# Patient Record
Sex: Female | Born: 1955 | Race: Black or African American | Hispanic: No | Marital: Married | Smoking: Former smoker
Health system: Southern US, Community
[De-identification: ages and names within clinical notes are randomized; demographics above are authoritative.]

## PROBLEM LIST (undated history)

## (undated) DIAGNOSIS — R5383 Other fatigue: Secondary | ICD-10-CM

## (undated) DIAGNOSIS — D509 Iron deficiency anemia, unspecified: Secondary | ICD-10-CM

## (undated) DIAGNOSIS — F419 Anxiety disorder, unspecified: Secondary | ICD-10-CM

## (undated) DIAGNOSIS — F32A Depression, unspecified: Secondary | ICD-10-CM

## (undated) DIAGNOSIS — M1990 Unspecified osteoarthritis, unspecified site: Secondary | ICD-10-CM

## (undated) DIAGNOSIS — F329 Major depressive disorder, single episode, unspecified: Secondary | ICD-10-CM

## (undated) DIAGNOSIS — E119 Type 2 diabetes mellitus without complications: Secondary | ICD-10-CM

## (undated) DIAGNOSIS — I10 Essential (primary) hypertension: Secondary | ICD-10-CM

## (undated) DIAGNOSIS — E7800 Pure hypercholesterolemia, unspecified: Secondary | ICD-10-CM

## (undated) DIAGNOSIS — K219 Gastro-esophageal reflux disease without esophagitis: Secondary | ICD-10-CM

## (undated) HISTORY — DX: Type 2 diabetes mellitus without complications: E11.9

## (undated) HISTORY — DX: Gastro-esophageal reflux disease without esophagitis: K21.9

## (undated) HISTORY — DX: Essential (primary) hypertension: I10

## (undated) HISTORY — DX: Other fatigue: R53.83

## (undated) HISTORY — DX: Pure hypercholesterolemia, unspecified: E78.00

## (undated) HISTORY — DX: Iron deficiency anemia, unspecified: D50.9

---

## 2004-01-12 ENCOUNTER — Emergency Department (HOSPITAL_COMMUNITY): Admission: EM | Admit: 2004-01-12 | Discharge: 2004-01-12 | Payer: Self-pay | Admitting: *Deleted

## 2004-04-04 ENCOUNTER — Ambulatory Visit (HOSPITAL_COMMUNITY): Admission: RE | Admit: 2004-04-04 | Discharge: 2004-04-04 | Payer: Self-pay | Admitting: Family Medicine

## 2004-04-11 ENCOUNTER — Encounter: Admission: RE | Admit: 2004-04-11 | Discharge: 2004-04-11 | Payer: Self-pay | Admitting: Family Medicine

## 2004-04-11 ENCOUNTER — Encounter (INDEPENDENT_AMBULATORY_CARE_PROVIDER_SITE_OTHER): Payer: Self-pay | Admitting: *Deleted

## 2004-05-23 ENCOUNTER — Ambulatory Visit: Payer: Self-pay | Admitting: Family Medicine

## 2004-11-14 ENCOUNTER — Ambulatory Visit: Payer: Self-pay | Admitting: Family Medicine

## 2004-12-19 ENCOUNTER — Ambulatory Visit: Payer: Self-pay | Admitting: Family Medicine

## 2004-12-28 ENCOUNTER — Ambulatory Visit: Payer: Self-pay | Admitting: Family Medicine

## 2005-02-20 ENCOUNTER — Ambulatory Visit: Payer: Self-pay | Admitting: Family Medicine

## 2005-02-21 ENCOUNTER — Ambulatory Visit (HOSPITAL_COMMUNITY): Admission: RE | Admit: 2005-02-21 | Discharge: 2005-02-21 | Payer: Self-pay | Admitting: Family Medicine

## 2005-03-07 ENCOUNTER — Ambulatory Visit (HOSPITAL_COMMUNITY): Admission: RE | Admit: 2005-03-07 | Discharge: 2005-03-07 | Payer: Self-pay | Admitting: *Deleted

## 2005-03-20 ENCOUNTER — Ambulatory Visit (HOSPITAL_COMMUNITY): Admission: RE | Admit: 2005-03-20 | Discharge: 2005-03-20 | Payer: Self-pay | Admitting: *Deleted

## 2005-06-19 ENCOUNTER — Ambulatory Visit: Payer: Self-pay | Admitting: Family Medicine

## 2005-09-18 ENCOUNTER — Ambulatory Visit: Payer: Self-pay | Admitting: Family Medicine

## 2005-11-21 ENCOUNTER — Encounter: Admission: RE | Admit: 2005-11-21 | Discharge: 2005-11-21 | Payer: Self-pay | Admitting: Family Medicine

## 2005-12-31 ENCOUNTER — Ambulatory Visit: Payer: Self-pay | Admitting: Family Medicine

## 2006-01-14 ENCOUNTER — Ambulatory Visit: Payer: Self-pay | Admitting: Family Medicine

## 2006-02-19 ENCOUNTER — Ambulatory Visit: Payer: Self-pay | Admitting: Family Medicine

## 2006-04-16 ENCOUNTER — Ambulatory Visit: Payer: Self-pay | Admitting: Family Medicine

## 2006-08-08 ENCOUNTER — Ambulatory Visit: Payer: Self-pay | Admitting: Family Medicine

## 2006-11-26 ENCOUNTER — Encounter: Payer: Self-pay | Admitting: Family Medicine

## 2006-11-26 ENCOUNTER — Other Ambulatory Visit: Admission: RE | Admit: 2006-11-26 | Discharge: 2006-11-26 | Payer: Self-pay | Admitting: Family Medicine

## 2006-11-26 ENCOUNTER — Ambulatory Visit: Payer: Self-pay | Admitting: Family Medicine

## 2006-11-26 LAB — CONVERTED CEMR LAB: Microalb, Ur: 2.13 mg/dL — ABNORMAL HIGH (ref 0.00–1.89)

## 2006-11-27 ENCOUNTER — Encounter: Payer: Self-pay | Admitting: Family Medicine

## 2006-11-27 LAB — CONVERTED CEMR LAB
ALT: 21 units/L (ref 0–35)
AST: 22 units/L (ref 0–37)
Albumin: 3.9 g/dL (ref 3.5–5.2)
Alkaline Phosphatase: 73 units/L (ref 39–117)
BUN: 6 mg/dL (ref 6–23)
Basophils Absolute: 0 10*3/uL (ref 0.0–0.1)
Basophils Relative: 0 % (ref 0–1)
Bilirubin, Direct: 0.1 mg/dL (ref 0.0–0.3)
CO2: 28 meq/L (ref 19–32)
Calcium: 8.9 mg/dL (ref 8.4–10.5)
Chloride: 99 meq/L (ref 96–112)
Cholesterol: 174 mg/dL (ref 0–200)
Creatinine, Ser: 0.58 mg/dL (ref 0.40–1.20)
Eosinophils Absolute: 0.2 10*3/uL (ref 0.0–0.7)
Eosinophils Relative: 2 % (ref 0–5)
Glucose, Bld: 101 mg/dL — ABNORMAL HIGH (ref 70–99)
HCT: 40.2 % (ref 36.0–46.0)
HDL: 53 mg/dL (ref >39)
Hemoglobin: 13 g/dL (ref 12.0–15.0)
Hgb A1c MFr Bld: 6.3 % — ABNORMAL HIGH (ref 4.6–6.1)
Indirect Bilirubin: 0.2 mg/dL (ref 0.0–0.9)
LDL Cholesterol: 96 mg/dL (ref 0–99)
Lymphocytes Relative: 42 % (ref 12–46)
Lymphs Abs: 4.4 10*3/uL — ABNORMAL HIGH (ref 0.7–3.3)
MCHC: 32.3 g/dL (ref 30.0–36.0)
MCV: 88.2 fL (ref 78.0–100.0)
Monocytes Absolute: 0.5 10*3/uL (ref 0.2–0.7)
Monocytes Relative: 5 % (ref 3–11)
Neutro Abs: 5.3 10*3/uL (ref 1.7–7.7)
Neutrophils Relative %: 51 % (ref 43–77)
Platelets: 333 10*3/uL (ref 150–400)
Potassium: 3.2 meq/L — ABNORMAL LOW (ref 3.5–5.3)
RBC: 4.56 M/uL (ref 3.87–5.11)
RDW: 16.1 % — ABNORMAL HIGH (ref 11.5–14.0)
Sodium: 140 meq/L (ref 135–145)
TSH: 4.039 microintl units/mL (ref 0.350–5.50)
Total Bilirubin: 0.3 mg/dL (ref 0.3–1.2)
Total CHOL/HDL Ratio: 3.3
Total Protein: 6.7 g/dL (ref 6.0–8.3)
Triglycerides: 127 mg/dL (ref <150)
VLDL: 25 mg/dL (ref 0–40)
WBC: 10.4 10*3/uL (ref 4.0–10.5)

## 2006-12-22 ENCOUNTER — Ambulatory Visit (HOSPITAL_COMMUNITY): Admission: RE | Admit: 2006-12-22 | Discharge: 2006-12-22 | Payer: Self-pay | Admitting: Family Medicine

## 2007-03-19 ENCOUNTER — Ambulatory Visit: Payer: Self-pay | Admitting: Family Medicine

## 2007-07-06 ENCOUNTER — Ambulatory Visit: Payer: Self-pay | Admitting: Family Medicine

## 2007-07-06 LAB — CONVERTED CEMR LAB
ALT: 13 units/L (ref 0–35)
AST: 13 units/L (ref 0–37)
Albumin: 4.1 g/dL (ref 3.5–5.2)
Alkaline Phosphatase: 63 units/L (ref 39–117)
BUN: 5 mg/dL — ABNORMAL LOW (ref 6–23)
Bilirubin, Direct: 0.1 mg/dL (ref 0.0–0.3)
CO2: 26 meq/L (ref 19–32)
Calcium: 8.8 mg/dL (ref 8.4–10.5)
Chloride: 100 meq/L (ref 96–112)
Cholesterol: 159 mg/dL (ref 0–200)
Creatinine, Ser: 0.58 mg/dL (ref 0.40–1.20)
Glucose, Bld: 145 mg/dL — ABNORMAL HIGH (ref 70–99)
HDL: 44 mg/dL (ref >39)
Indirect Bilirubin: 0.3 mg/dL (ref 0.0–0.9)
LDL Cholesterol: 89 mg/dL (ref 0–99)
Potassium: 3.4 meq/L — ABNORMAL LOW (ref 3.5–5.3)
Sodium: 139 meq/L (ref 135–145)
Total Bilirubin: 0.4 mg/dL (ref 0.3–1.2)
Total CHOL/HDL Ratio: 3.6
Total Protein: 7.3 g/dL (ref 6.0–8.3)
Triglycerides: 128 mg/dL (ref <150)
VLDL: 26 mg/dL (ref 0–40)

## 2007-07-20 ENCOUNTER — Ambulatory Visit: Payer: Self-pay | Admitting: Family Medicine

## 2007-11-18 ENCOUNTER — Ambulatory Visit: Payer: Self-pay | Admitting: Family Medicine

## 2007-11-24 DIAGNOSIS — F17200 Nicotine dependence, unspecified, uncomplicated: Secondary | ICD-10-CM | POA: Insufficient documentation

## 2007-11-24 DIAGNOSIS — E785 Hyperlipidemia, unspecified: Secondary | ICD-10-CM

## 2007-11-24 DIAGNOSIS — I10 Essential (primary) hypertension: Secondary | ICD-10-CM | POA: Insufficient documentation

## 2007-12-25 ENCOUNTER — Emergency Department (HOSPITAL_COMMUNITY): Admission: EM | Admit: 2007-12-25 | Discharge: 2007-12-25 | Payer: Self-pay | Admitting: Emergency Medicine

## 2007-12-28 ENCOUNTER — Ambulatory Visit: Payer: Self-pay | Admitting: Family Medicine

## 2007-12-30 ENCOUNTER — Encounter: Payer: Self-pay | Admitting: Family Medicine

## 2007-12-30 LAB — CONVERTED CEMR LAB: Microalb, Ur: 5.34 mg/dL — ABNORMAL HIGH (ref 0.00–1.89)

## 2007-12-31 ENCOUNTER — Encounter: Payer: Self-pay | Admitting: Family Medicine

## 2007-12-31 LAB — CONVERTED CEMR LAB
ALT: 18 units/L (ref 0–35)
AST: 15 units/L (ref 0–37)
Albumin: 3.9 g/dL (ref 3.5–5.2)
Alkaline Phosphatase: 54 units/L (ref 39–117)
BUN: 5 mg/dL — ABNORMAL LOW (ref 6–23)
Basophils Absolute: 0 10*3/uL (ref 0.0–0.1)
Basophils Relative: 0 % (ref 0–1)
Bilirubin, Direct: 0.1 mg/dL (ref 0.0–0.3)
CO2: 22 meq/L (ref 19–32)
Calcium: 8.7 mg/dL (ref 8.4–10.5)
Chloride: 102 meq/L (ref 96–112)
Cholesterol: 135 mg/dL (ref 0–200)
Creatinine, Ser: 0.65 mg/dL (ref 0.40–1.20)
Eosinophils Absolute: 0.2 10*3/uL (ref 0.0–0.7)
Eosinophils Relative: 2 % (ref 0–5)
Glucose, Bld: 114 mg/dL — ABNORMAL HIGH (ref 70–99)
HCT: 30.4 % — ABNORMAL LOW (ref 36.0–46.0)
HDL: 40 mg/dL (ref >39)
Hemoglobin: 9.5 g/dL — ABNORMAL LOW (ref 12.0–15.0)
Indirect Bilirubin: 0.2 mg/dL (ref 0.0–0.9)
LDL Cholesterol: 71 mg/dL (ref 0–99)
Lymphocytes Relative: 36 % (ref 12–46)
Lymphs Abs: 4.2 10*3/uL — ABNORMAL HIGH (ref 0.7–4.0)
MCHC: 31.3 g/dL (ref 30.0–36.0)
MCV: 82.2 fL (ref 78.0–100.0)
Monocytes Absolute: 0.5 10*3/uL (ref 0.1–1.0)
Monocytes Relative: 5 % (ref 3–12)
Neutro Abs: 6.9 10*3/uL (ref 1.7–7.7)
Neutrophils Relative %: 58 % (ref 43–77)
Platelets: 461 10*3/uL — ABNORMAL HIGH (ref 150–400)
Potassium: 3.5 meq/L (ref 3.5–5.3)
RBC: 3.7 M/uL — ABNORMAL LOW (ref 3.87–5.11)
RDW: 15.5 % (ref 11.5–15.5)
Sodium: 140 meq/L (ref 135–145)
Total Bilirubin: 0.3 mg/dL (ref 0.3–1.2)
Total CHOL/HDL Ratio: 3.4
Total Protein: 6.9 g/dL (ref 6.0–8.3)
Triglycerides: 121 mg/dL (ref <150)
VLDL: 24 mg/dL (ref 0–40)
WBC: 11.9 10*3/uL — ABNORMAL HIGH (ref 4.0–10.5)

## 2008-02-22 ENCOUNTER — Telehealth: Payer: Self-pay | Admitting: Family Medicine

## 2008-02-22 ENCOUNTER — Ambulatory Visit: Payer: Self-pay | Admitting: Family Medicine

## 2008-02-22 ENCOUNTER — Encounter: Payer: Self-pay | Admitting: Family Medicine

## 2008-02-22 DIAGNOSIS — E119 Type 2 diabetes mellitus without complications: Secondary | ICD-10-CM

## 2008-02-22 LAB — CONVERTED CEMR LAB
Basophils Absolute: 0 10*3/uL (ref 0.0–0.1)
Basophils Relative: 0 % (ref 0–1)
Eosinophils Absolute: 0.1 10*3/uL (ref 0.0–0.7)
Eosinophils Relative: 2 % (ref 0–5)
Glucose, Bld: 133 mg/dL
HCT: 25.3 % — ABNORMAL LOW (ref 36.0–46.0)
Hemoglobin: 8 g/dL — ABNORMAL LOW (ref 12.0–15.0)
Lymphocytes Relative: 24 % (ref 12–46)
Lymphs Abs: 1.9 10*3/uL (ref 0.7–4.0)
MCHC: 31.8 g/dL (ref 30.0–36.0)
MCV: 73.5 fL — ABNORMAL LOW (ref 78.0–100.0)
Monocytes Absolute: 0.7 10*3/uL (ref 0.1–1.0)
Monocytes Relative: 8 % (ref 3–12)
Neutro Abs: 5.2 10*3/uL (ref 1.7–7.7)
Neutrophils Relative %: 66 % (ref 43–77)
Platelets: 359 10*3/uL (ref 150–400)
RBC: 3.44 M/uL — ABNORMAL LOW (ref 3.87–5.11)
RDW: 16.8 % — ABNORMAL HIGH (ref 11.5–15.5)
WBC: 7.9 10*3/uL (ref 4.0–10.5)

## 2008-02-23 LAB — CONVERTED CEMR LAB: Retic Ct Pct: 1.6 % (ref 0.4–3.1)

## 2008-03-02 ENCOUNTER — Ambulatory Visit (HOSPITAL_COMMUNITY): Admission: RE | Admit: 2008-03-02 | Discharge: 2008-03-02 | Payer: Self-pay | Admitting: Family Medicine

## 2008-03-15 ENCOUNTER — Ambulatory Visit (HOSPITAL_COMMUNITY): Admission: RE | Admit: 2008-03-15 | Discharge: 2008-03-15 | Payer: Self-pay | Admitting: General Surgery

## 2008-03-15 ENCOUNTER — Encounter: Payer: Self-pay | Admitting: Family Medicine

## 2008-03-30 ENCOUNTER — Ambulatory Visit: Payer: Self-pay | Admitting: Family Medicine

## 2008-03-30 ENCOUNTER — Encounter: Payer: Self-pay | Admitting: Family Medicine

## 2008-03-30 ENCOUNTER — Other Ambulatory Visit: Admission: RE | Admit: 2008-03-30 | Discharge: 2008-03-30 | Payer: Self-pay | Admitting: Family Medicine

## 2008-04-01 ENCOUNTER — Encounter: Payer: Self-pay | Admitting: Family Medicine

## 2008-04-04 ENCOUNTER — Ambulatory Visit: Payer: Self-pay | Admitting: Family Medicine

## 2008-04-04 ENCOUNTER — Ambulatory Visit (HOSPITAL_COMMUNITY): Admission: RE | Admit: 2008-04-04 | Discharge: 2008-04-04 | Payer: Self-pay | Admitting: Family Medicine

## 2008-04-04 ENCOUNTER — Telehealth: Payer: Self-pay | Admitting: Family Medicine

## 2008-04-21 ENCOUNTER — Ambulatory Visit: Payer: Self-pay | Admitting: Family Medicine

## 2008-04-21 ENCOUNTER — Telehealth: Payer: Self-pay | Admitting: Family Medicine

## 2008-04-25 ENCOUNTER — Encounter: Payer: Self-pay | Admitting: Family Medicine

## 2008-05-09 ENCOUNTER — Telehealth: Payer: Self-pay | Admitting: Family Medicine

## 2008-05-11 ENCOUNTER — Telehealth: Payer: Self-pay | Admitting: Family Medicine

## 2008-05-12 ENCOUNTER — Ambulatory Visit: Payer: Self-pay | Admitting: Family Medicine

## 2008-05-17 ENCOUNTER — Encounter: Payer: Self-pay | Admitting: Family Medicine

## 2008-06-22 ENCOUNTER — Telehealth: Payer: Self-pay | Admitting: Family Medicine

## 2008-06-22 ENCOUNTER — Ambulatory Visit: Payer: Self-pay | Admitting: Family Medicine

## 2008-07-28 ENCOUNTER — Encounter: Payer: Self-pay | Admitting: Family Medicine

## 2008-07-28 ENCOUNTER — Ambulatory Visit: Payer: Self-pay | Admitting: Family Medicine

## 2008-07-28 LAB — CONVERTED CEMR LAB: Glucose, Bld: 110 mg/dL

## 2008-08-01 ENCOUNTER — Encounter: Payer: Self-pay | Admitting: Family Medicine

## 2008-08-02 LAB — CONVERTED CEMR LAB
ALT: 25 units/L (ref 0–35)
AST: 16 units/L (ref 0–37)
Albumin: 3.9 g/dL (ref 3.5–5.2)
CO2: 25 meq/L (ref 19–32)
Calcium: 8.5 mg/dL (ref 8.4–10.5)
HDL: 43 mg/dL (ref >39)
Sodium: 144 meq/L (ref 135–145)
Total Bilirubin: 0.3 mg/dL (ref 0.3–1.2)
Total CHOL/HDL Ratio: 3.5

## 2008-08-04 ENCOUNTER — Telehealth: Payer: Self-pay | Admitting: Family Medicine

## 2008-08-05 ENCOUNTER — Telehealth: Payer: Self-pay | Admitting: Family Medicine

## 2008-08-29 ENCOUNTER — Encounter: Payer: Self-pay | Admitting: Family Medicine

## 2008-08-30 LAB — CONVERTED CEMR LAB: Potassium: 3.6 meq/L (ref 3.5–5.3)

## 2008-09-06 ENCOUNTER — Encounter: Payer: Self-pay | Admitting: Family Medicine

## 2008-09-08 ENCOUNTER — Encounter: Payer: Self-pay | Admitting: Family Medicine

## 2008-09-12 ENCOUNTER — Ambulatory Visit: Payer: Self-pay | Admitting: Family Medicine

## 2008-10-13 ENCOUNTER — Encounter: Payer: Self-pay | Admitting: Family Medicine

## 2008-10-26 ENCOUNTER — Telehealth: Payer: Self-pay | Admitting: Family Medicine

## 2008-11-14 ENCOUNTER — Ambulatory Visit: Payer: Self-pay | Admitting: Family Medicine

## 2008-11-14 LAB — CONVERTED CEMR LAB: Hgb A1c MFr Bld: 6.2 %

## 2009-01-16 ENCOUNTER — Ambulatory Visit: Payer: Self-pay | Admitting: Family Medicine

## 2009-02-16 ENCOUNTER — Encounter: Payer: Self-pay | Admitting: Family Medicine

## 2009-02-17 ENCOUNTER — Ambulatory Visit: Payer: Self-pay | Admitting: Cardiology

## 2009-02-17 ENCOUNTER — Ambulatory Visit: Payer: Self-pay | Admitting: Family Medicine

## 2009-02-17 DIAGNOSIS — R079 Chest pain, unspecified: Secondary | ICD-10-CM | POA: Insufficient documentation

## 2009-02-20 DIAGNOSIS — R5383 Other fatigue: Secondary | ICD-10-CM

## 2009-02-20 DIAGNOSIS — R5381 Other malaise: Secondary | ICD-10-CM | POA: Insufficient documentation

## 2009-02-22 ENCOUNTER — Ambulatory Visit: Payer: Self-pay | Admitting: Cardiovascular Disease

## 2009-02-22 ENCOUNTER — Encounter: Payer: Self-pay | Admitting: Cardiology

## 2009-02-22 ENCOUNTER — Ambulatory Visit (HOSPITAL_COMMUNITY): Admission: RE | Admit: 2009-02-22 | Discharge: 2009-02-22 | Payer: Self-pay | Admitting: Cardiology

## 2009-02-23 LAB — CONVERTED CEMR LAB
BUN: 5 mg/dL — ABNORMAL LOW (ref 6–23)
Bilirubin, Direct: 0.1 mg/dL (ref 0.0–0.3)
CO2: 27 meq/L (ref 19–32)
Chloride: 98 meq/L (ref 96–112)
Creatinine, Urine: 89.3 mg/dL
Glucose, Bld: 106 mg/dL — ABNORMAL HIGH (ref 70–99)
Hemoglobin: 12.9 g/dL (ref 12.0–15.0)
Hgb A1c MFr Bld: 5.8 % (ref 4.6–6.1)
Indirect Bilirubin: 0.4 mg/dL (ref 0.0–0.9)
LDL Cholesterol: 100 mg/dL — ABNORMAL HIGH (ref 0–99)
Microalb Creat Ratio: 10 mg/g (ref 0.0–30.0)
Potassium: 3.3 meq/L — ABNORMAL LOW (ref 3.5–5.3)
RBC: 4.64 M/uL (ref 3.87–5.11)
TSH: 4.222 microintl units/mL (ref 0.350–4.500)
VLDL: 27 mg/dL (ref 0–40)
WBC: 9.4 10*3/uL (ref 4.0–10.5)

## 2009-02-27 ENCOUNTER — Telehealth: Payer: Self-pay | Admitting: Cardiology

## 2009-04-06 ENCOUNTER — Ambulatory Visit: Payer: Self-pay | Admitting: Family Medicine

## 2009-04-06 ENCOUNTER — Encounter: Payer: Self-pay | Admitting: Family Medicine

## 2009-04-06 ENCOUNTER — Other Ambulatory Visit: Admission: RE | Admit: 2009-04-06 | Discharge: 2009-04-06 | Payer: Self-pay | Admitting: Family Medicine

## 2009-04-06 DIAGNOSIS — N3000 Acute cystitis without hematuria: Secondary | ICD-10-CM

## 2009-04-06 LAB — CONVERTED CEMR LAB
Blood in Urine, dipstick: NEGATIVE
Candida species: NEGATIVE
Glucose, Bld: 117 mg/dL
Glucose, Urine, Semiquant: NEGATIVE
Specific Gravity, Urine: 1.025
WBC Urine, dipstick: NEGATIVE
pH: 6

## 2009-04-07 ENCOUNTER — Encounter: Payer: Self-pay | Admitting: Family Medicine

## 2009-04-19 ENCOUNTER — Telehealth: Payer: Self-pay | Admitting: Family Medicine

## 2009-04-26 ENCOUNTER — Ambulatory Visit: Payer: Self-pay | Admitting: Family Medicine

## 2009-05-10 ENCOUNTER — Ambulatory Visit: Payer: Self-pay | Admitting: Family Medicine

## 2009-05-10 DIAGNOSIS — J209 Acute bronchitis, unspecified: Secondary | ICD-10-CM | POA: Insufficient documentation

## 2009-05-10 DIAGNOSIS — J0100 Acute maxillary sinusitis, unspecified: Secondary | ICD-10-CM

## 2009-05-10 DIAGNOSIS — H6690 Otitis media, unspecified, unspecified ear: Secondary | ICD-10-CM | POA: Insufficient documentation

## 2009-05-10 LAB — CONVERTED CEMR LAB: Glucose, Bld: 112 mg/dL

## 2009-05-18 ENCOUNTER — Encounter: Payer: Self-pay | Admitting: Family Medicine

## 2009-07-26 ENCOUNTER — Ambulatory Visit: Payer: Self-pay | Admitting: Family Medicine

## 2009-07-26 DIAGNOSIS — E119 Type 2 diabetes mellitus without complications: Secondary | ICD-10-CM | POA: Insufficient documentation

## 2009-07-26 DIAGNOSIS — F339 Major depressive disorder, recurrent, unspecified: Secondary | ICD-10-CM

## 2009-07-26 DIAGNOSIS — R7303 Prediabetes: Secondary | ICD-10-CM | POA: Insufficient documentation

## 2009-07-27 DIAGNOSIS — E876 Hypokalemia: Secondary | ICD-10-CM | POA: Insufficient documentation

## 2009-07-27 LAB — CONVERTED CEMR LAB
Albumin: 3.4 g/dL — ABNORMAL LOW (ref 3.5–5.2)
Alkaline Phosphatase: 77 units/L (ref 39–117)
CO2: 31 meq/L (ref 19–32)
Chloride: 95 meq/L — ABNORMAL LOW (ref 96–112)
Creatinine, Ser: 0.45 mg/dL (ref 0.40–1.20)
HDL: 48 mg/dL (ref >39)
Hgb A1c MFr Bld: 5.8 % (ref 4.6–6.1)
LDL Cholesterol: 91 mg/dL (ref 0–99)
Potassium: 2.8 meq/L — ABNORMAL LOW (ref 3.5–5.3)
Total Bilirubin: 0.4 mg/dL (ref 0.3–1.2)
Total CHOL/HDL Ratio: 3.4
Total Protein: 6 g/dL (ref 6.0–8.3)
Triglycerides: 128 mg/dL (ref <150)
VLDL: 26 mg/dL (ref 0–40)

## 2009-11-21 ENCOUNTER — Telehealth (INDEPENDENT_AMBULATORY_CARE_PROVIDER_SITE_OTHER): Payer: Self-pay | Admitting: *Deleted

## 2009-11-22 ENCOUNTER — Ambulatory Visit: Payer: Self-pay | Admitting: Family Medicine

## 2009-11-22 DIAGNOSIS — R634 Abnormal weight loss: Secondary | ICD-10-CM

## 2009-11-23 ENCOUNTER — Encounter: Payer: Self-pay | Admitting: Physician Assistant

## 2009-11-24 ENCOUNTER — Telehealth: Payer: Self-pay | Admitting: Family Medicine

## 2009-11-24 LAB — CONVERTED CEMR LAB
Chloride: 101 meq/L (ref 96–112)
Potassium: 3.2 meq/L — ABNORMAL LOW (ref 3.5–5.3)
Sodium: 141 meq/L (ref 135–145)

## 2009-12-12 ENCOUNTER — Encounter: Payer: Self-pay | Admitting: Internal Medicine

## 2009-12-12 ENCOUNTER — Ambulatory Visit: Payer: Self-pay | Admitting: Internal Medicine

## 2009-12-12 DIAGNOSIS — R109 Unspecified abdominal pain: Secondary | ICD-10-CM

## 2009-12-12 DIAGNOSIS — R1310 Dysphagia, unspecified: Secondary | ICD-10-CM | POA: Insufficient documentation

## 2009-12-13 ENCOUNTER — Ambulatory Visit: Payer: Self-pay | Admitting: Family Medicine

## 2009-12-13 DIAGNOSIS — B354 Tinea corporis: Secondary | ICD-10-CM | POA: Insufficient documentation

## 2009-12-14 ENCOUNTER — Ambulatory Visit: Payer: Self-pay | Admitting: Internal Medicine

## 2009-12-14 ENCOUNTER — Ambulatory Visit (HOSPITAL_COMMUNITY): Admission: RE | Admit: 2009-12-14 | Discharge: 2009-12-14 | Payer: Self-pay | Admitting: Internal Medicine

## 2009-12-14 HISTORY — PX: ESOPHAGOGASTRODUODENOSCOPY: SHX1529

## 2009-12-21 ENCOUNTER — Encounter: Payer: Self-pay | Admitting: Internal Medicine

## 2009-12-28 ENCOUNTER — Telehealth: Payer: Self-pay | Admitting: Family Medicine

## 2010-01-01 ENCOUNTER — Ambulatory Visit: Payer: Self-pay | Admitting: Internal Medicine

## 2010-01-01 ENCOUNTER — Ambulatory Visit (HOSPITAL_COMMUNITY): Admission: RE | Admit: 2010-01-01 | Discharge: 2010-01-01 | Payer: Self-pay | Admitting: Internal Medicine

## 2010-01-01 HISTORY — PX: COLONOSCOPY: SHX174

## 2010-01-06 ENCOUNTER — Encounter: Payer: Self-pay | Admitting: Internal Medicine

## 2010-01-08 ENCOUNTER — Telehealth: Payer: Self-pay | Admitting: Family Medicine

## 2010-01-08 ENCOUNTER — Encounter: Payer: Self-pay | Admitting: Internal Medicine

## 2010-01-15 LAB — CONVERTED CEMR LAB
BUN: 6 mg/dL (ref 6–23)
CO2: 26 meq/L (ref 19–32)
Calcium: 8.8 mg/dL (ref 8.4–10.5)
Chloride: 105 meq/L (ref 96–112)
Creatinine, Ser: 0.46 mg/dL (ref 0.40–1.20)
Glucose, Bld: 109 mg/dL — ABNORMAL HIGH (ref 70–99)
Hgb A1c MFr Bld: 5.9 % — ABNORMAL HIGH (ref <5.7)
Potassium: 3.9 meq/L (ref 3.5–5.3)
Sodium: 142 meq/L (ref 135–145)

## 2010-01-18 ENCOUNTER — Ambulatory Visit: Payer: Self-pay | Admitting: Family Medicine

## 2010-01-19 ENCOUNTER — Encounter: Payer: Self-pay | Admitting: Family Medicine

## 2010-01-23 LAB — CONVERTED CEMR LAB
AST: 16 units/L (ref 0–37)
Albumin: 3.6 g/dL (ref 3.5–5.2)
Bilirubin, Direct: <0.1 mg/dL (ref 0.0–0.3)
HDL: 61 mg/dL (ref >39)
Total Bilirubin: 0.2 mg/dL — ABNORMAL LOW (ref 0.3–1.2)
Total CHOL/HDL Ratio: 3.3
VLDL: 22 mg/dL (ref 0–40)

## 2010-02-27 ENCOUNTER — Ambulatory Visit: Payer: Self-pay | Admitting: Gastroenterology

## 2010-02-27 DIAGNOSIS — D126 Benign neoplasm of colon, unspecified: Secondary | ICD-10-CM

## 2010-04-30 ENCOUNTER — Other Ambulatory Visit: Admission: RE | Admit: 2010-04-30 | Discharge: 2010-04-30 | Payer: Self-pay | Admitting: Family Medicine

## 2010-04-30 ENCOUNTER — Ambulatory Visit: Payer: Self-pay | Admitting: Family Medicine

## 2010-04-30 LAB — CONVERTED CEMR LAB: OCCULT 1: NEGATIVE

## 2010-05-01 ENCOUNTER — Encounter: Payer: Self-pay | Admitting: Family Medicine

## 2010-05-01 LAB — CONVERTED CEMR LAB
Creatinine, Urine: 230.8 mg/dL
Microalb Creat Ratio: 3.8 mg/g (ref 0.0–30.0)
Microalb, Ur: 0.88 mg/dL (ref 0.00–1.89)

## 2010-05-02 ENCOUNTER — Encounter: Payer: Self-pay | Admitting: Family Medicine

## 2010-05-02 ENCOUNTER — Telehealth: Payer: Self-pay | Admitting: Family Medicine

## 2010-05-03 ENCOUNTER — Encounter: Payer: Self-pay | Admitting: Family Medicine

## 2010-05-04 LAB — CONVERTED CEMR LAB
ALT: 13 units/L (ref 0–35)
AST: 17 units/L (ref 0–37)
Albumin: 3.9 g/dL (ref 3.5–5.2)
Alkaline Phosphatase: 78 units/L (ref 39–117)
BUN: 8 mg/dL (ref 6–23)
Basophils Absolute: 0 10*3/uL (ref 0.0–0.1)
Basophils Relative: 0 % (ref 0–1)
Bilirubin, Direct: <0.1 mg/dL (ref 0.0–0.3)
CO2: 26 meq/L (ref 19–32)
Calcium: 8.9 mg/dL (ref 8.4–10.5)
Chloride: 104 meq/L (ref 96–112)
Cholesterol: 212 mg/dL — ABNORMAL HIGH (ref 0–200)
Creatinine, Ser: 0.59 mg/dL (ref 0.40–1.20)
Eosinophils Absolute: 0.2 10*3/uL (ref 0.0–0.7)
Eosinophils Relative: 3 % (ref 0–5)
Glucose, Bld: 103 mg/dL — ABNORMAL HIGH (ref 70–99)
HCT: 32.1 % — ABNORMAL LOW (ref 36.0–46.0)
HDL: 49 mg/dL (ref >39)
Hemoglobin: 10.5 g/dL — ABNORMAL LOW (ref 12.0–15.0)
Hgb A1c MFr Bld: 6.1 % — ABNORMAL HIGH (ref <5.7)
LDL Cholesterol: 132 mg/dL — ABNORMAL HIGH (ref 0–99)
Lymphocytes Relative: 36 % (ref 12–46)
Lymphs Abs: 3.3 10*3/uL (ref 0.7–4.0)
MCHC: 32.7 g/dL (ref 30.0–36.0)
MCV: 81.3 fL (ref 78.0–100.0)
Monocytes Absolute: 0.5 10*3/uL (ref 0.1–1.0)
Monocytes Relative: 6 % (ref 3–12)
Neutro Abs: 4.9 10*3/uL (ref 1.7–7.7)
Neutrophils Relative %: 55 % (ref 43–77)
Platelets: 378 10*3/uL (ref 150–400)
Potassium: 3.8 meq/L (ref 3.5–5.3)
RBC: 3.95 M/uL (ref 3.87–5.11)
RDW: 15.2 % (ref 11.5–15.5)
Retic Ct Pct: 1.4 % (ref 0.4–3.1)
Sodium: 141 meq/L (ref 135–145)
Total Bilirubin: 0.3 mg/dL (ref 0.3–1.2)
Total CHOL/HDL Ratio: 4.3
Total Protein: 6.9 g/dL (ref 6.0–8.3)
Triglycerides: 153 mg/dL — ABNORMAL HIGH (ref <150)
Uric Acid, Serum: 4.4 mg/dL (ref 2.4–7.0)
VLDL: 31 mg/dL (ref 0–40)
WBC: 9 10*3/uL (ref 4.0–10.5)

## 2010-05-07 ENCOUNTER — Ambulatory Visit (HOSPITAL_COMMUNITY): Admission: RE | Admit: 2010-05-07 | Discharge: 2010-05-07 | Payer: Self-pay | Admitting: Family Medicine

## 2010-05-15 ENCOUNTER — Ambulatory Visit (HOSPITAL_COMMUNITY): Admission: RE | Admit: 2010-05-15 | Discharge: 2010-05-15 | Payer: Self-pay | Admitting: Family Medicine

## 2010-05-15 ENCOUNTER — Ambulatory Visit: Payer: Self-pay | Admitting: Family Medicine

## 2010-05-15 ENCOUNTER — Telehealth: Payer: Self-pay | Admitting: Family Medicine

## 2010-05-15 DIAGNOSIS — M25579 Pain in unspecified ankle and joints of unspecified foot: Secondary | ICD-10-CM

## 2010-05-16 ENCOUNTER — Encounter: Payer: Self-pay | Admitting: Physician Assistant

## 2010-07-25 ENCOUNTER — Encounter: Payer: Self-pay | Admitting: Family Medicine

## 2010-07-31 ENCOUNTER — Encounter: Payer: Self-pay | Admitting: Family Medicine

## 2010-08-12 ENCOUNTER — Encounter: Payer: Self-pay | Admitting: Family Medicine

## 2010-08-19 LAB — CONVERTED CEMR LAB
Hgb A1c MFr Bld: 6.1 %
Retic Ct Pct: 2.1 % (ref 0.4–3.1)

## 2010-08-21 NOTE — Letter (Signed)
Summary: Laboratory/X-Ray Results  [facility]  [address]   [place], [place] [address]   Phone: [phone]  Fax: [phone]    Lab/X-Ray Results  May 16, 2010  MRN: [id number]  BLESSEN KIMBROUGH [address] [place], [place]  [address]    The results of your recent lab/x-ray has been reviewed and were found: NORMAL    If you have any questions, please contact our office.     Adella Hare LPN

## 2010-08-21 NOTE — Assessment & Plan Note (Signed)
Summary: med change?- room 2   Vital Signs:  Patient profile:   56 year old female Menstrual status:  postmenopausal Height:      59 inches Weight:      134.25 pounds BMI:     27.21 O2 Sat:      98 % on Room air Pulse rate:   88 / minute Resp:     16 per minute BP sitting:   120 / 80  (left arm)  Vitals Entered By: Adella Hare LPN (Nov 22, 1608 4:09 PM) CC: med review Is Patient Diabetic? No Pain Assessment Patient in pain? no      Comments patient did not bring meds in with her   Primary Provider:  Syliva Overman MD  CC:  med review.  History of Present Illness: Pt is here today with complaints of decreased appetitie.  She was seen by Dr Lodema Hong Jan 2011 with same complaint and being socially withdrawn.  She started taking Sertaline as prescribed.  States that she is happy, enjoys doing things now, and is sleeping much better.  But still has no appetite.  States that even if she orders a  little food it just sits on her plate.  She does have a hx of GERD.  Takes Omeprazole 40 mg daily, but still gets heartburn 3-4 days a week which she uses Tums for.  BM's have been normal, but less often,etc because she's not eating.  No abd pain.  No fever or chills.   Allergies (verified): No Known Drug Allergies   Impression & Recommendations:  Problem # 1:  GERD (ICD-530.81) Assessment Deteriorated  The following medications were removed from the medication list:    Omeprazole 40 Mg Cpdr (Omeprazole) ..... One cap by mouth qd Her updated medication list for this problem includes:    Aciphex 20 Mg Tbec (Rabeprazole sodium) .Marland Kitchen... Take 1 daily for heartburn  Orders: Gastroenterology Referral (GI)  Problem # 2:  DEPRESSION (ICD-311) Assessment: Improved Discussed with pt since her mood and sleep is better will continue her on this for at least a couple more mos.  Her updated medication list for this problem includes:    Sertraline Hcl 25 Mg Tabs (Sertraline hcl) .Marland Kitchen...  Take 1 tablet by mouth once a day  Problem # 3:  WEIGHT LOSS (ICD-783.21) Assessment: Comment Only 10 # wt loss in last 4 mos.  Problem # 4:  HYPOKALEMIA (ICD-276.8) Assessment: Comment Only  Complete Medication List: 1)  Lovastatin 10 Mg Tabs (Lovastatin) .... Take 1 tab by mouth at bedtime 2)  Clonidine Hcl 0.1 Mg Tabs (Clonidine hcl) .... Take on tablet by mouth at bedtime 3)  Slow Fe 160 (50 Fe) Mg Cr-tabs (Ferrous sulfate dried) .... Take one tablet by mouth once a day 4)  Cod Liver Oil W/vit A, C & D Chew (Vitamins a d c) .... Take one tablet by mouth once a day 5)  Fish Oil 1200 Mg Caps (Omega-3 fatty acids) .... Take one tablet by mouth once a day 6)  Aspirin 81 Mg Tbec (Aspirin) .... Take one tablet by mouth once a day 7)  Metformin Hcl 1000 Mg Tabs (Metformin hcl) .... One tab by mouth bid 8)  Lotensin 20 Mg Tabs (Benazepril hcl) .... One tab by mouth qd 9)  Klor-con M20 20 Meq Cr-tabs (Potassium chloride crys cr) .... One tab by mouth bid 10)  Hydrochlorothiazide 25 Mg Tabs (Hydrochlorothiazide) .... Take 1 tablet by mouth once a day 11)  Nitroglycerin 0.4 Mg Subl (Nitroglycerin) .... One tablet under tongue every 5 minutes as needed for chest pain---may repeat times three 12)  Sertraline Hcl 25 Mg Tabs (Sertraline hcl) .... Take 1 tablet by mouth once a day 13)  Aciphex 20 Mg Tbec (Rabeprazole sodium) .... Take 1 daily for heartburn  Other Orders: T-Basic Metabolic Panel [id number])  Patient Instructions: 1)  Please schedule a follow-up appointment in 2 months. 2)  I have referred you to a GI doctor. 3)  Discontinue Omeprazole.  I have prescribed Aciphex for you to take instead. 4)  Continue Sertaline. I have refilled this for you. Prescriptions: ACIPHEX 20 MG TBEC (RABEPRAZOLE SODIUM) take 1 daily for heartburn  #30 x 3   Entered and Authorized by:   Esperanza Sheets PA   Signed by:   Esperanza Sheets PA on 11/22/2009   Method used:   Electronically to        [facility] (retail)       [address]       [place], [place]  [address]       Ph: [phone]       Fax: [phone]   RxID:   [id number] SERTRALINE HCL 25 MG TABS (SERTRALINE HCL) Take 1 tablet by mouth once a day  #30 x 3   Entered and Authorized by:   Esperanza Sheets PA   Signed by:   Esperanza Sheets PA on 11/22/2009   Method used:   Electronically to        [facility] (retail)       [address]       [place], [place]  [address]       Ph: [phone]       Fax: [phone]   RxID:   [id number]

## 2010-08-21 NOTE — Miscellaneous (Signed)
Summary: Orders Update  Clinical Lists Changes  Orders: Added new Test order of T-Basic Metabolic Panel [id number]) - Signed Added new Test order of T-Lipid Profile [id number]) - Signed Added new Test order of T-Hepatic Function [id number]) - Signed Added new Test order of T-CBC w/Diff [id number]) - Signed Added new Test order of T- Hemoglobin A1C ([id number]) - Signed Added new Test order of T-Uric Acid (Blood) [id number]) - Signed

## 2010-08-21 NOTE — Assessment & Plan Note (Signed)
Summary: GERD,WT LOSS/SS   Visit Type:  consult Referring Provider:  Tye Savoy Primary Care Provider:  Syliva Overman  Chief Complaint:  gerd, weight loss, and decreased appetite.  History of Present Illness: Jaime Murray is a pleasant 55 y/o female, patient of Dr. Lodema Hong, who is here for further evaluation of gerd, weight loss, decreased appetite. She c/o ongoing symptoms for one year. She has had problems with heartburn, but this is better on Aciphex. She was switched from omeprazole. She c/o dysphagia to meats and large pills. She denies n/v. She c/o early satiety. She c/o poor appetite. She has documented 20 pound weight loss in one year. She c/o left flank pain. She uses heating pad with relief. Does not seem to be associated with meals or bowels. She admits to change in bowels over the last one year. She went from having one BM daily to one BM twice per week. Miralax 17 grams as needed caused diarrhea so she stopped it. Denies melena, brbpr. She had TCS about 1-2 years ago by Dr. Lovell Sheehan. Denies polyps. FH of CRC in mother, ?age. She also noted decreased stools when her metformin dose was decreased. She was started on Zoloft five months ago to see if her appetite loss was secondary to depression. She denies any improvement in her symptoms.  Labs 1/11, LFTs normal except albumin 3.4, HgbA1C 5.8, Creatinine normal.  Current Medications (verified): 1)  Lovastatin 10 Mg Tabs (Lovastatin) .... Take 1 Tab By Mouth At Bedtime 2)  Clonidine Hcl 0.1 Mg Tabs (Clonidine Hcl) .... Take On Tablet By Mouth At Bedtime 3)  Slow Fe 160 (50 Fe) Mg Cr-Tabs (Ferrous Sulfate Dried) .... Take One Tablet By Mouth Once A Day 4)  Cod Liver Oil W/vit A, C & D  Chew (Vitamins A D C) .... Take One Tablet By Mouth Once A Day 5)  Fish Oil 1200 Mg Caps (Omega-3 Fatty Acids) .... Take One Tablet By Mouth Once A Day 6)  Aspirin 81 Mg Tbec (Aspirin) .... Take One Tablet By Mouth Once A Day 7)  Metformin Hcl 1000 Mg  Tabs (Metformin Hcl) .... One Tab By Mouth Qd 8)  Lotensin 20 Mg Tabs (Benazepril Hcl) .... One Tab By Mouth Qd 9)  Klor-Con M20 20 Meq Cr-Tabs (Potassium Chloride Crys Cr) .... One Tab By Mouth Bid 10)  Hydrochlorothiazide 25 Mg Tabs (Hydrochlorothiazide) .... Take 1 Tablet By Mouth Once A Day 11)  Nitroglycerin 0.4 Mg Subl (Nitroglycerin) .... One Tablet Under Tongue Every 5 Minutes As Needed For Chest Pain---May Repeat Times Three 12)  Sertraline Hcl 25 Mg Tabs (Sertraline Hcl) .... Take 1 Tablet By Mouth Once A Day 13)  Aciphex 20 Mg Tbec (Rabeprazole Sodium) .... Take 1 Daily For Heartburn  Allergies: No Known Drug Allergies  Past History:  Past Medical History: Current Problems:  NICOTINE ADDICTION (ICD-305.1) HYPERLIPIDEMIA (ICD-272.4) HYPERTENSION (ICD-401.9) DIABETES MELLITUS, TYPE I (ICD-250.01) GERD  Past Surgical History: Reviewed history from 11/14/2008 and no changes required. endometrial ablation March 2010 , Dr. 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 Eyes:  Denies vision loss. ENT:  Complains of difficulty swallowing; denies nasal congestion, sore throat, and hoarseness. CV:  Denies chest pains,  angina, palpitations, dyspnea on exertion, and peripheral edema. Resp:  Denies dyspnea at rest, dyspnea with exercise, cough, sputum, and wheezing. GI:  See HPI. GU:  Denies urinary burning and blood in urine. MS:  Denies joint pain / LOM. Derm:  Denies rash and itching. Neuro:  Denies weakness, frequent headaches, memory loss, and confusion. Psych:  Denies depression and anxiety. Endo:  Complains of unusual  weight change. Heme:  Denies bruising and bleeding. Allergy:  Denies hives and rash.  Vital Signs:  Patient profile:   55 year old female Menstrual status:  postmenopausal Height:      59 inches Weight:      135 pounds BMI:     27.37 Temp:     99.1 degrees F oral Pulse rate:   80 / minute BP sitting:   120 / 80  Vitals Entered By: Leanna Battles. Lenorris Karger PA-C (Dec 12, 2009 10:23 AM)  Physical Exam  General:  Well developed, well nourished, no acute distress. Head:  Normocephalic and atraumatic. Eyes:  Conjunctivae pink, no scleral icterus.  Mouth:  Oropharyngeal mucosa moist, pink.  No lesions, erythema or exudate.    Neck:  Supple; no masses or thyromegaly. Lungs:  Clear throughout to auscultation. Heart:  Regular rate and rhythm; no murmurs, rubs,  or bruits. Abdomen:  Mild left flank pain to deep palpation. Positive BS. No HSM or masses. No rebound or guarding. No abd bruit or hernia. No CVA tenderness. No rash. Extremities:  No clubbing, cyanosis, edema or deformities noted. Neurologic:  Alert and  oriented x4;  grossly normal neurologically. Skin:  Intact without significant lesions or rashes. Cervical Nodes:  No significant cervical adenopathy. Psych:  Alert and cooperative. Normal mood and affect.  Impression & Recommendations:  Problem # 1:  WEIGHT LOSS (ICD-783.21)  Twenty pound weight loss in one year associated with anorexia, early satiety, GERD. GERD symptoms better with recent switch to Aciphex. She has dysphagia to meats and large pills, ?esophageal ring or stricture. Recommend EGD for further evaluation of symptoms. EGD/ED to be performed in near future.  Risks, alternatives, benefits including but not limited to risk of reaction to medications, bleeding, infection, and perforation addressed.  Patient voiced understanding and verbal consent obtained.   Orders: Consultation Level IV ([address])  Problem # 2:  DYSPHAGIA UNSPECIFIED (ICD-787.20)  See  #1.  Orders: Consultation Level IV (60454)  Problem # 3:  CONSTIPATION (ICD-564.00)  Constipation in last one year. Change in metformin dose and diminshed by mouth intake likely contributing factor. Will obtain copy of last TCS report done by Dr. Lovell Sheehan. She will try 1/2 capful of Miralax every other day for constipation. She can titrate dose downward if this is still too much for her. Dose not to exceed one capful daily. ?left flank pain caused by constipation. Will see where her pain is after adequate treatment of constipation.   Orders: Consultation Level IV ([id number])  Problem # 4:  NEOPLASM, MALIGNANT, COLON, FAMILY HX, MOTHER (ICD-V16.0) She will find out how old her mother was at time of diagnosis of CRC. Last TCS was by Dr. Lovell Sheehan in 2009 for IDA. Reported as normal. Will determine time of next TCS after she finds out age of diagnosis of mother.  Patient states she would like her next TCS to be done by Dr. Jena Gauss. Orders: Consultation Level IV ([id number])  Problem # 5:  ANEMIA, IRON DEFICIENCY (ICD-280.9) H/O IDA on 2009, resolved on chronic iron therapy.  I would like to thank Dr.  Simpson for allowing [place] to take part in the care of this nice patient.  Appended Document: GERD,WT LOSS/SS Discussed with Dr. Jena Gauss. Last TCS two years ago by Dr. Lovell Sheehan. Given FH CRC, change in bowels, and no prior TCS by gastroenterologist. Recommend TCS at same time as EGD. Tried to call patient, LMOAM. Please let her know above. If agreeable, please add TCS. Day of prep take 1/2 dose metformin, hold iron 7 days.  Appended Document: GERD,WT LOSS/SS Pt would like to wait until after her EGD to have her tcs..I will contact pt to schedule tcs.

## 2010-08-21 NOTE — Progress Notes (Signed)
  Phone Note Call from Patient   Summary of Call: [facility] called and the cream that you sent in is NOT for a rash as you state in the directions, she said its a bleaching cream.  Is that what you meant to send? Initial call taken by: Everitt Amber LPN,  May 02, 2010 2:02 PM  Follow-up for Phone Call        yes the cream is to be used as a bleaching agent I will retype with appropriate directions, tell them i appreciate the call, pls print and send in new version, thanks  Follow-up by: Syliva Overman MD,  May 02, 2010 6:47 PM  Additional Follow-up for Phone Call Additional follow up Details #1::        refaxed with correction written Additional Follow-up by: Everitt Amber LPN,  May 03, 2010 11:26 AM    New/Updated Medications: HYDROQUINONE 4 % CREA (HYDROQUINONE) apply to darkened areas twice daily sparingly for 10 days , then as needed, use is for bleaching dark areas on skin Prescriptions: HYDROQUINONE 4 % CREA (HYDROQUINONE) apply to darkened areas twice daily sparingly for 10 days , then as needed, use is for bleaching dark areas on skin  #45gm x 0   Entered by:   Everitt Amber LPN   Authorized by:   Syliva Overman MD   Signed by:   Everitt Amber LPN on 45/40/9811   Method used:   Printed then faxed to ...       [facility] ([facility]ail)       [address]       [place], [place]  [address]       Ph: [phone]       Fax: [phone]   RxID:   [id number] HYDROQUINONE 4 % CREA (HYDROQUINONE) apply to darkened areas twice daily sparingly for 10 days , then as needed, use is for bleaching dark areas on skin  #45gm x 0   Entered and Authorized by:   Syliva Overman MD   Signed by:   Syliva Overman MD on 05/02/2010   Method used:   Historical   RxID:   [id number]

## 2010-08-21 NOTE — Assessment & Plan Note (Signed)
Summary: office visit   Vital Signs:  Patient profile:   55 year old female Menstrual status:  postmenopausal Height:      59 inches Weight:      144.75 pounds BMI:     29.34 O2 Sat:      95 % on Room air Pulse rate:   101 / minute Pulse rhythm:   regular Resp:     16 per minute BP sitting:   110 / 78 Cuff size:   regular  Vitals Entered By: Everitt Amber (July 26, 2009 8:11 AM)  Nutrition Counseling: Patient's BMI is greater than 25 and therefore counseled on weight management options.  O2 Flow:  Room air CC: white discharge from nose and throat when she coughs and sneezes, some right ear pain.   Primary Care Provider:  Syliva Overman MD  CC:  white discharge from nose and throat when she coughs and sneezes and some right ear pain.Marland Kitchen  History of Present Illness: PT STATES SHE HAS NOT BEEN DOING WELL IN THE PAST APPROX 3 MONTHS. sHE HAS NO APETITE, HER SMOKING IS INCREASED, SHE IS BECOMING INCREASINGLY SOCIALLY WITHDRAWN, STATES SHE WAS DEPRESSED SEVERAl yrs ago when her children moved out used alcohol at hat time, refused prescription meds. She can identify no trigger at this time.She is not suicidal or homicidal, has significant anhidonia, but is able to functon on her job.  Preventive Screening-Counseling & Management  Alcohol-Tobacco     Smoking Cessation Counseling: yes  Current Medications (verified): 1)  Lovastatin 10 Mg Tabs (Lovastatin) .... Take 1 Tab By Mouth At Bedtime 2)  Clonidine Hcl 0.1 Mg Tabs (Clonidine Hcl) .... Take On Tablet By Mouth At Bedtime 3)  Slow Fe 160 (50 Fe) Mg Cr-Tabs (Ferrous Sulfate Dried) .... Take One Tablet By Mouth Once A Day 4)  Cod Liver Oil W/vit A, C & D  Chew (Vitamins A D C) .... Take One Tablet By Mouth Once A Day 5)  Fish Oil 1200 Mg Caps (Omega-3 Fatty Acids) .... Take One Tablet By Mouth Once A Day 6)  Aspirin 81 Mg Tbec (Aspirin) .... Take One Tablet By Mouth Once A Day 7)  Metformin Hcl 1000 Mg Tabs (Metformin Hcl) ....  One Tab By Mouth Bid 8)  Lotensin 20 Mg Tabs (Benazepril Hcl) .... One Tab By Mouth Qd 9)  Omeprazole 40 Mg Cpdr (Omeprazole) .... One Cap By Mouth Qd 10)  Klor-Con M20 20 Meq Cr-Tabs (Potassium Chloride Crys Cr) .... One Tab By Mouth Bid 11)  Hydrochlorothiazide 25 Mg Tabs (Hydrochlorothiazide) .... Take 1 Tablet By Mouth Once A Day 12)  Nitroglycerin 0.4 Mg Subl (Nitroglycerin) .... One Tablet Under Tongue Every 5 Minutes As Needed For Chest Pain---May Repeat Times Three  Allergies (verified): No Known Drug Allergies  Review of Systems      See HPI General:  Complains of fatigue and weight loss; denies chills and fever; repotrs verey poor apetite. Eyes:  Denies blurring and discharge. ENT:  Denies hoarseness, nasal congestion, sinus pressure, and sore throat. CV:  Denies chest pain or discomfort, palpitations, and swelling of feet. Resp:  Denies cough and sputum productive. GI:  Denies abdominal pain, constipation, diarrhea, nausea, and vomiting. GU:  Denies dysuria and urinary frequency. MS:  Denies joint pain and stiffness. Derm:  Denies itching and rash. Neuro:  Denies headaches, seizures, sensation of room spinning, tingling, and tremors. Psych:  Complains of depression and mental problems; denies anxiety, panic attacks, sense of great danger,  suicidal thoughts/plans, thoughts of violence, and unusual visions or sounds; anhidoni, poor sleep, social withdrawal, worse in the past 3 months. Endo:  Denies cold intolerance, excessive hunger, excessive thirst, excessive urination, heat intolerance, polyuria, and weight change. Heme:  Denies abnormal bruising and bleeding. Allergy:  Denies hives or rash.  Physical Exam  General:  Well-developed,well-nourished,in no acute distress; alert,appropriate and cooperative throughout examination HEENT: No facial asymmetry,  EOMI, No sinus tenderness, TM's Clear, oropharynx  pink and moist.   Chest: Clear to auscultation bilaterally.  CVS: S1,  S2, No murmurs, No S3.   Abd: Soft, Nontender.  MS: Adequate ROM spine, hips, shoulders and knees.  Ext: No edema.   CNS: CN 2-12 intact, power tone and sensation normal throughout.   Skin: Intact, no visible lesions or rashes.  Psych: Good eye contact, flat affect.  Memory intact, depressed appearing.    Impression & Recommendations:  Problem # 1:  DEPRESSION (ICD-311) Assessment Comment Only  Her updated medication list for this problem includes:    Sertraline Hcl 25 Mg Tabs (Sertraline hcl) .Marland Kitchen... Take 1 tablet by mouth once a day  Discussed treatment options, including trial of antidpressant medication. Will refer to behavioral health. Follow-up call in in 24-48 hours and recheck in 2 weeks, sooner as needed. Patient agrees to call if any worsening of symptoms or thoughts of doing harm arise. Verified that the patient has no suicidal ideation at this time.   Problem # 2:  HYPOKALEMIA (ICD-276.8) Assessment: Comment Only chem 7 checked  Problem # 3:  NICOTINE ADDICTION (ICD-305.1) Assessment: Deteriorated  Encouraged smoking cessation and discussed different methods for smoking cessation.   Problem # 4:  HYPERTENSION (ICD-401.9) Assessment: Improved  Her updated medication list for this problem includes:    Clonidine Hcl 0.1 Mg Tabs (Clonidine hcl) .Marland Kitchen... Take on tablet by mouth at bedtime    Lotensin 20 Mg Tabs (Benazepril hcl) ..... One tab by mouth qd    Hydrochlorothiazide 25 Mg Tabs (Hydrochlorothiazide) .Marland Kitchen... Take 1 tablet by mouth once a day  Orders: T-Basic Metabolic Panel [phone])  BP today: 110/78 Prior BP: 142/88 (05/10/2009)  Labs Reviewed: K+: 3.3 (02/22/2009) Creat: : 0.62 (02/22/2009)   Chol: 180 (02/22/2009)   HDL: 53 (02/22/2009)   LDL: 100 (02/22/2009)   TG: 134 (02/22/2009)  Problem # 5:  DIABETES MELLITUS (ICD-250.00) Assessment: Comment Only  Her updated medication list for this problem includes:    Aspirin 81 Mg Tbec (Aspirin) .Marland Kitchen... Take  one tablet by mouth once a day    Metformin Hcl 1000 Mg Tabs (Metformin hcl) ..... One tab by mouth two times a day, pt to reduce to one daILY    Lotensin 20 Mg Tabs (Benazepril hcl) ..... One tab by mouth qd  Labs Reviewed: Creat: 0.62 (02/22/2009)    Reviewed HgBA1c results: 5.8 (02/22/2009)  6.2 (11/14/2008)  Complete Medication List: 1)  Lovastatin 10 Mg Tabs (Lovastatin) .... Take 1 tab by mouth at bedtime 2)  Clonidine Hcl 0.1 Mg Tabs (Clonidine hcl) .... Take on tablet by mouth at bedtime 3)  Slow Fe 160 (50 Fe) Mg Cr-tabs (Ferrous sulfate dried) .... Take one tablet by mouth once a day 4)  Cod Liver Oil W/vit A, C & D Chew (Vitamins a d c) .... Take one tablet by mouth once a day 5)  Fish Oil 1200 Mg Caps (Omega-3 fatty acids) .... Take one tablet by mouth once a day 6)  Aspirin 81 Mg Tbec (Aspirin) .... Take one  tablet by mouth once a day 7)  Metformin Hcl 1000 Mg Tabs (Metformin hcl) .... One tab by mouth bid 8)  Lotensin 20 Mg Tabs (Benazepril hcl) .... One tab by mouth qd 9)  Omeprazole 40 Mg Cpdr (Omeprazole) .... One cap by mouth qd 10)  Klor-con M20 20 Meq Cr-tabs (Potassium chloride crys cr) .... One tab by mouth bid 11)  Hydrochlorothiazide 25 Mg Tabs (Hydrochlorothiazide) .... Take 1 tablet by mouth once a day 12)  Nitroglycerin 0.4 Mg Subl (Nitroglycerin) .... One tablet under tongue every 5 minutes as needed for chest pain---may repeat times three 13)  Sertraline Hcl 25 Mg Tabs (Sertraline hcl) .... Take 1 tablet by mouth once a day  Other Orders: T-Hepatic Function [id number]) T-Lipid Profile [id number]) T- Hemoglobin A1C ([id number]) Radiology Referral (Radiology)  Patient Instructions: 1)  Please schedule a follow-up appointment in 2 months. 2)  Tobacco is very bad for your health and your loved ones! You Should stop smoking!.start 6 ciggs per day 3)  Stop Smoking Tips: Choose a Quit date. Cut down before the Quit date. decide what you will do as a  substitute when you feel the urge to smoke(gum,toothpick,exercise). 4)  It is important that you exercise regularly at least 30 minutes 5 times a week. If you develop chest pain, have severe difficulty breathing, or feel very tired , stop exercising immediately and seek medical attention. 5)  new med as discussed 6)  BMP prior to visit, ICD-9: 7)  Hepatic Panel prior to visit, ICD-9: 8)  Lipid Panel prior to visit, ICD-9:  fasting today 9)  HbgA1C prior to visit, ICD-9: 10)  mamo will be scheduled for 5.5 weeks 11)  New med as discussed Prescriptions: KLOR-CON M20 20 MEQ CR-TABS (POTASSIUM CHLORIDE CRYS CR) one tab by mouth bid  #180 x 2   Entered by:   Everitt Amber   Authorized by:   Syliva Overman MD   Signed by:   Everitt Amber on 07/26/2009   Method used:   Faxed to ...       [facility])       [address] [address]       [place], [place]  [address]       Ph: [phone]       Fax: [phone]   RxID:   [id number] METFORMIN HCL 1000 MG TABS (METFORMIN HCL) one tab by mouth bid  #180 x 2   Entered by:   Everitt Amber   Authorized by:   Syliva Overman MD   Signed by:   Everitt Amber on 07/26/2009   Method used:   Faxed to ...       [facility])       [address] [address]       [place], [place]  [address]       Ph: [phone]       Fax: [phone]   RxID:   [id number] CLONIDINE HCL 0.1 MG TABS (CLONIDINE HCL) Take on tablet by mouth at bedtime  #90 x 2   Entered by:   Everitt Amber   Authorized by:   Syliva Overman MD   Signed by:   Everitt Amber on 07/26/2009   Method used:   Faxed to ...       [facility])       [address] [address]       [place], [place]  [address]       Ph: [phone]       Fax: [phone]  RxID:   [id number] SERTRALINE HCL 25 MG TABS (SERTRALINE HCL) Take 1 tablet by mouth once a day  #30 x 3   Entered and Authorized by:   Syliva Overman MD   Signed by:   Syliva Overman MD on 07/26/2009   Method used:   Electronically  to        [facility]   [address] (retail)       [address]       [place], [place]  [address]       Ph: [phone]       Fax: [phone]   RxID:   [id number]

## 2010-08-21 NOTE — Progress Notes (Signed)
  Phone Note Other Incoming   Caller: dr simpson Summary of Call: pls order chem 7 and hBA1c on Jaime Murray and let her know getit2 to 3 days before ov end June...non fasting thanks Initial call taken by: Syliva Overman MD,  December 28, 2009 4:41 PM  Follow-up for Phone Call        called pt and no answer. Mailed letter Follow-up by: Everitt Amber LPN,  December 29, 2009 3:53 PM

## 2010-08-21 NOTE — Progress Notes (Signed)
Summary: please advise  Phone Note Call from Patient   Summary of Call: patient called to let us know she has finish taking her antibiotic and she wanted to let us know she still has a cough, can she still get flu shot, with cough and she would also like an inflammatory shot.  Please advise, you can leave msg. with husband. Initial call taken by: Curtis Sites,  May 15, 2010 9:11 AM  Follow-up for Phone Call        is cough productive, if so needs sputum for c/s also she needs cxr eval cough, if xray is clear and no fever or chills, yes to the flu shot. pls order stamp and fax over cxr after talking to her  pls  where is hurting doc why she needs anti-unflammatory in ophone note, she can get toradol 60mg  IM for documented pain. Follow-up by: Syliva Overman MD,  May 15, 2010 12:47 PM  Additional Follow-up for Phone Call Additional follow up Details #1::        chest x ray ordered, toradol given or right ankle pain and inflammation Additional Follow-up by: Adella Hare LPN,  May 15, 2010 4:46 PM  New Problems: ANKLE PAIN, RIGHT (ICD-719.47)   New Problems: ANKLE PAIN, RIGHT (ICD-719.47)   Influenza Vaccine    Vaccine Type: Fluvax Non-MCR    Site: left deltoid    Mfr: novartis    Dose: 0.5 ml    Route: IM    Given by: Adella Hare LPN    Exp. Date: 11/2010    Lot #: [id number]    VIS given: 02/13/10 version given May 15, 2010.    Medication Administration  Injection # 1:    Medication: Ketorolac-Toradol 15mg     Diagnosis: ANKLE PAIN, RIGHT (ICD-719.47)    Route: IM    Site: RUOQ gluteus    Exp Date: 05/23/2011    Lot #: [id number]    Mfr: novaplus    Comments: toradol 60mg  given    Patient tolerated injection without complications    Given by: Adella Hare LPN (May 15, 2010 4:48 PM)  Orders Added: 1)  CXR- 2view [CXR] 2)  Influenza Vaccine NON MCR [00028] 3)  Ketorolac-Toradol 15mg  [J1885] 4)  Admin of Therapeutic Inj  intramuscular or subcutaneous  [72536]

## 2010-08-21 NOTE — Assessment & Plan Note (Signed)
Summary: ov   Vital Signs:  Patient profile:   55 year old female Menstrual status:  postmenopausal Height:      59 inches Weight:      140.25 pounds BMI:     28.43 O2 Sat:      98 % Pulse rate:   96 / minute Pulse rhythm:   regular Resp:     16 per minute BP sitting:   148 / 90  (left arm) Cuff size:   regular  Vitals Entered By: Everitt Amber LPN (January 18, 2010 3:20 PM)  Nutrition Counseling: Patient's BMI is greater than 25 and therefore counseled on weight management options. CC: Follow up chronic problems   Primary Care Provider:  Syliva Overman  CC:  Follow up chronic problems.  History of Present Illness: Jaime Murray reports that she feels much better than she did when i saw her 6 months ago. She is no longer experiencing symptoms of depression, she is not withdrawn, she is not overwhelmed, she no longer has excessive crying spell and her symptoms of depression have cleared on medication.She  has experienced no adverse sid effects from the medication, and wishes to contin ue taking the medication. he had upper emndo recently with dilatation of a tight ring and states she feels much better, she is able to eat without any discomfoort now.  Preventive Screening-Counseling & Management  Alcohol-Tobacco     Smoking Cessation Counseling: yes  Current Medications (verified): 1)  Lovastatin 10 Mg Tabs (Lovastatin) .... Take 1 Tab By Mouth At Bedtime 2)  Clonidine Hcl 0.1 Mg Tabs (Clonidine Hcl) .... Take On Tablet By Mouth At Bedtime 3)  Slow Fe 160 (50 Fe) Mg Cr-Tabs (Ferrous Sulfate Dried) .... Take One Tablet By Mouth Once A Day 4)  Cod Liver Oil W/vit A, C & D  Chew (Vitamins A D C) .... Take One Tablet By Mouth Once A Day 5)  Fish Oil 1200 Mg Caps (Omega-3 Fatty Acids) .... Take One Tablet By Mouth Once A Day 6)  Aspirin 81 Mg Tbec (Aspirin) .... Take One Tablet By Mouth Once A Day 7)  Metformin Hcl 1000 Mg Tabs (Metformin Hcl) .... One Tab By Mouth Qd 8)  Lotensin 20  Mg Tabs (Benazepril Hcl) .... One Tab By Mouth Qd 9)  Klor-Con M20 20 Meq Cr-Tabs (Potassium Chloride Crys Cr) .... One Tab By Mouth Bid 10)  Hydrochlorothiazide 25 Mg Tabs (Hydrochlorothiazide) .... Take 1 Tablet By Mouth Once A Day 11)  Nitroglycerin 0.4 Mg Subl (Nitroglycerin) .... One Tablet Under Tongue Every 5 Minutes As Needed For Chest Pain---May Repeat Times Three 12)  Sertraline Hcl 25 Mg Tabs (Sertraline Hcl) .... Take 1 Tablet By Mouth Once A Day 13)  Aciphex 20 Mg Tbec (Rabeprazole Sodium) .... Take 1 Daily For Heartburn 14)  Lotrisone 1-0.05 % Crea (Clotrimazole-Betamethasone) .... Apply Two Times A Day To Rash  Allergies (verified): No Known Drug Allergies  Review of Systems      See HPI General:  Denies chills and fever. Eyes:  Denies discharge and eye pain. ENT:  Denies hoarseness, nasal congestion, sinus pressure, and sore throat. CV:  Denies chest pain or discomfort, palpitations, and swelling of feet. Resp:  Denies cough and sputum productive. GI:  Denies abdominal pain, constipation, diarrhea, nausea, and vomiting. GU:  Denies dysuria and urinary frequency. Jaime:  Denies joint pain, low back pain, mid back pain, and stiffness. Endo:  Denies excessive thirst and excessive urination. Heme:  Denies abnormal  bruising and bleeding. Allergy:  Denies hives or rash and itching eyes.  Physical Exam  General:  Well-developed,well-nourished,in no acute distress; alert,appropriate and cooperative throughout examination HEENT: No facial asymmetry,  EOMI, No sinus tenderness, TM's Clear, oropharynx  pink and moist.   Chest: Clear to auscultation bilaterally.  CVS: S1, S2, No murmurs, No S3.   Abd: Soft, Nontender.  Jaime: Adequate ROM spine, hips, shoulders and knees.  Ext: No edema.   CNS: CN 2-12 intact, power tone and sensation normal throughout.   Skin: Intact, no visible lesions or rashes.  Psych: Good eye contact, normal affect.  Memory intact, not anxious or depressed  appearing.    Impression & Recommendations:  Problem # 1:  DEPRESSION (ICD-311) Assessment Improved  Her updated medication list for this problem includes:    Sertraline Hcl 25 Mg Tabs (Sertraline hcl) .Marland Kitchen... Take 1 tablet by mouth once a day  Problem # 2:  DIABETES MELLITUS (ICD-250.00) Assessment: Unchanged  The following medications were removed from the medication list:    Metformin Hcl 1000 Mg Tabs (Metformin hcl) ..... One tab by mouth qd Her updated medication list for this problem includes:    Aspirin 81 Mg Tbec (Aspirin) .Marland Kitchen... Take one tablet by mouth once a day    Lotensin 20 Mg Tabs (Benazepril hcl) ..... One tab by mouth qd    Metformin Hcl 500 Mg Tabs (Metformin hcl) .Marland Kitchen... Take 1 tablet by mouth once a day  Orders: T- Hemoglobin A1C ([id number])  Labs Reviewed: Creat: 0.46 (01/15/2010)    Reviewed HgBA1c results: 5.9 (01/15/2010)  5.8 (07/26/2009)  Problem # 3:  GERD (ICD-530.81) Assessment: Improved  Her updated medication list for this problem includes:    Aciphex 20 Mg Tbec (Rabeprazole sodium) .Marland Kitchen... Take 1 daily for heartburn  Problem # 4:  NICOTINE ADDICTION (ICD-305.1) Assessment: Unchanged  Encouraged smoking cessation and discussed different methods for smoking cessation.   Problem # 5:  HYPERTENSION (ICD-401.9) Assessment: Deteriorated  Her updated medication list for this problem includes:    Clonidine Hcl 0.1 Mg Tabs (Clonidine hcl) .Marland Kitchen... Take on tablet by mouth at bedtime    Lotensin 20 Mg Tabs (Benazepril hcl) ..... One tab by mouth qd    Hydrochlorothiazide 25 Mg Tabs (Hydrochlorothiazide) .Marland Kitchen... Take 1 tablet by mouth once a day  BP today: 148/90, cessation of cigarette smoking , sodium reaction and regular physical activity discussed and encouraged, If no improvement at next visit dose inc in meds Prior BP: 140/90 (12/13/2009)  Labs Reviewed: K+: 3.9 (01/15/2010) Creat: : 0.46 (01/15/2010)   Chol: 165 (07/26/2009)   HDL: 48  (07/26/2009)   LDL: 91 (07/26/2009)   TG: 128 (07/26/2009)  Problem # 6:  HYPERLIPIDEMIA (ICD-272.4) Assessment: Comment Only  Her updated medication list for this problem includes:    Lovastatin 10 Mg Tabs (Lovastatin) .Marland Kitchen... Take 1 tab by mouth at bedtime  Labs Reviewed: SGOT: 23 (07/26/2009)   SGPT: 12 (07/26/2009)   HDL:48 (07/26/2009), 53 (02/22/2009)  LDL:91 (07/26/2009), 100 (09/81/1914)  Chol:165 (07/26/2009), 180 (02/22/2009)  Trig:128 (07/26/2009), 134 (02/22/2009)  Complete Medication List: 1)  Lovastatin 10 Mg Tabs (Lovastatin) .... Take 1 tab by mouth at bedtime 2)  Clonidine Hcl 0.1 Mg Tabs (Clonidine hcl) .... Take on tablet by mouth at bedtime 3)  Slow Fe 160 (50 Fe) Mg Cr-tabs (Ferrous sulfate dried) .... Take one tablet by mouth once a day 4)  Cod Liver Oil W/vit A, C & D Chew (Vitamins a d c) .Marland KitchenMarland KitchenMarland Kitchen  Take one tablet by mouth once a day 5)  Fish Oil 1200 Mg Caps (Omega-3 fatty acids) .... Take one tablet by mouth once a day 6)  Aspirin 81 Mg Tbec (Aspirin) .... Take one tablet by mouth once a day 7)  Lotensin 20 Mg Tabs (Benazepril hcl) .... One tab by mouth qd 8)  Klor-con M20 20 Meq Cr-tabs (Potassium chloride crys cr) .... One tab by mouth bid 9)  Hydrochlorothiazide 25 Mg Tabs (Hydrochlorothiazide) .... Take 1 tablet by mouth once a day 10)  Nitroglycerin 0.4 Mg Subl (Nitroglycerin) .... One tablet under tongue every 5 minutes as needed for chest pain---may repeat times three 11)  Sertraline Hcl 25 Mg Tabs (Sertraline hcl) .... Take 1 tablet by mouth once a day 12)  Aciphex 20 Mg Tbec (Rabeprazole sodium) .... Take 1 daily for heartburn 13)  Lotrisone 1-0.05 % Crea (Clotrimazole-betamethasone) .... Apply two times a day to rash 14)  Sertraline Hcl 25 Mg Tabs (Sertraline hcl) .... Take 1 tablet by mouth once a day 15)  Metformin Hcl 500 Mg Tabs (Metformin hcl) .... Take 1 tablet by mouth once a day  Patient Instructions: 1)  CPE mid to end  October, early morning appt  pls before work . 2)  Tobacco is very bad for your health and your loved ones! You Should stop smoking!. 3)  Stop Smoking Tips: Choose a Quit date. Cut down before the Quit date. decide what you will do as a substitute when you feel the urge to smoke(gum,toothpick,exercise).320 minutes 5 times a week. If you develop chest pain, have severe difficulty breathing, or feel very tired , stop exercising immediately and seek medical attention. 4)  You need 5)  Yoour blood sugar , and kidneys are great, pls reduce metformin to 1000mg  half daily till done, then new dose is 500mg  one daily. 6)  Continue sertraline, and pls contact Dr Rourke's offic about your aciphex, also sched an appt with him for f/u 7)  Your mamo is past due , you absolutely need to schedule it as soon as possible Prescriptions: METFORMIN HCL 500 MG TABS (METFORMIN HCL) Take 1 tablet by mouth once a day  #90 x 1   Entered and Authorized by:   Syliva Overman MD   Signed by:   Syliva Overman MD on 01/18/2010   Method used:   Printed then faxed to ...       [facility])       [address] [address]       [place], [place]  [address]       Ph: [phone]       Fax: [phone]   RxID:   [id number] SERTRALINE HCL 25 MG TABS (SERTRALINE HCL) Take 1 tablet by mouth once a day  #90 x 1   Entered and Authorized by:   Syliva Overman MD   Signed by:   Syliva Overman MD on 01/18/2010   Method used:   Printed then faxed to ...       [facility])       [address] [address]       [place], [place]  [address]       Ph: [phone]       Fax: [phone]   RxID:   [id number]

## 2010-08-21 NOTE — Progress Notes (Signed)
Summary: refill  Phone Note Call from Patient   Summary of Call: needs a rx for acid reflux and refill on zoloft. [phone] Initial call taken by: Rudene Anda,  Nov 24, 2009 10:39 AM    Prescriptions: SERTRALINE HCL 25 MG TABS (SERTRALINE HCL) Take 1 tablet by mouth once a day  #30 x 2   Entered by:   Adella Hare LPN   Authorized by:   Syliva Overman MD   Signed by:   Adella Hare LPN on 75/64/3329   Method used:   Electronically to        [facility] (retail)       [address]       [place], [place]  [address]       Ph: [phone]       Fax: [phone]   RxID:   [id number] ACIPHEX 20 MG TBEC (RABEPRAZOLE SODIUM) take 1 daily for heartburn  #30 x 2   Entered by:   Adella Hare LPN   Authorized by:   Syliva Overman MD   Signed by:   Adella Hare LPN on 31/51/7616   Method used:   Electronically to        [facility] (retail)       [address]       [place], [place]  [address]       Ph: [phone]       Fax: [phone]   RxID:   [id number]

## 2010-08-21 NOTE — Letter (Signed)
Summary: Letter  Letter   Imported By: Lind Guest 05/03/2010 14:57:31  _____________________________________________________________________  External Attachment:    Type:   Image     Comment:   External Document

## 2010-08-21 NOTE — Progress Notes (Signed)
Summary: MEDICINE  Phone Note Call from Patient   Summary of Call: SERTRALINE 25 MG TABLETS IS NOT WORKING SHE IS CONSTANTLY LOSING WEIGHT NEEDS SOMETHING ELSE TO HELP HER  [facility]  Initial call taken by: Lind Guest,  Nov 21, 2009 3:04 PM  Follow-up for Phone Call        Pt is overdue for follow up appt.  Needs to schedule one . Follow-up by: Esperanza Sheets PA,  Nov 21, 2009 3:14 PM  Additional Follow-up for Phone Call Additional follow up Details #1::        please advise needs ov and schedule Additional Follow-up by: Adella Hare LPN,  Nov 21, 1608 3:17 PM    Additional Follow-up for Phone Call Additional follow up Details #2::    LEFT MESSAGE Follow-up by: Lind Guest,  Nov 21, 2009 3:19 PM  Additional Follow-up for Phone Call Additional follow up Details #3:: Details for Additional Follow-up Action Taken: COMING IN TOMORROW AT 4:15 Additional Follow-up by: Lind Guest,  Nov 21, 2009 4:32 PM

## 2010-08-21 NOTE — Assessment & Plan Note (Signed)
Summary: FLU SHOT  Nurse Visit   Allergies: No Known Drug Allergies see phone note, everything documented there Adella Hare LPN  May 16, 2010 11:16 AM

## 2010-08-21 NOTE — Miscellaneous (Signed)
Summary: Orders Update  Clinical Lists Changes  Orders: Added new Test order of T-Urine Microalbumin w/creat. ratio [id number]) - Signed

## 2010-08-21 NOTE — Assessment & Plan Note (Signed)
Summary: PP FU 6-8 WEEKS/SS   Primary Care Provider:  Dr Lodema Hong  Chief Complaint:  follow up.  History of Present Illness: Here for FU GERD, constipation.  Takes Miralax 3 times per week for constipation, seems to help.  Has never tried fiber.  "Don't go if I don't take it!"  Denies abd pain, fever, wt loss, anorexia, heartburn, indigestion at tthis time.  Feels much better.  Denies rectal bleeding or melena.  Hx TA on last colonoscopy.  Hx IDA, last 02/2009 Hgb normal.  Current Problems (verified): 1)  Tinea Corporis  (ICD-110.5) 2)  Flank Pain, Left  (ICD-789.09) 3)  Neoplasm, Malignant, Colon, Family Hx, Mother  (ICD-V16.0) 4)  Constipation  (ICD-564.00) 5)  Dysphagia Unspecified  (ICD-787.20) 6)  Weight Loss  (ICD-783.21) 7)  Gerd  (ICD-530.81) 8)  Hypokalemia  (ICD-276.8) 9)  Impaired Glucose Tolerance  (ICD-271.3) 10)  Depression  (ICD-311) 11)  Acute Bronchitis  (ICD-466.0) 12)  Acute Maxillary Sinusitis  (ICD-461.0) 13)  Rom  (ICD-382.9) 14)  Acute Cystitis  (ICD-595.0) 15)  Other Screening Mammogram  (ICD-V76.12) 16)  Physical Examination  (ICD-V70.0) 17)  Fatigue  (ICD-780.79) 18)  Chest Pain Unspecified  (ICD-786.50) 19)  Anemia, Iron Deficiency  (ICD-280.9) 20)  Nicotine Addiction  (ICD-305.1) 21)  Diabetes Mellitus  (ICD-250.00) 22)  Other and Unspecified Hyperlipidemia  (ICD-272.4) 23)  Unspecified Essential Hypertension  (ICD-401.9) 24)  Nicotine Addiction  (ICD-305.1) 25)  Hyperlipidemia  (ICD-272.4) 26)  Hypertension  (ICD-401.9)  Current Medications (verified): 1)  Lovastatin 10 Mg Tabs (Lovastatin) .... Take 1 Tab By Mouth At Bedtime 2)  Clonidine Hcl 0.1 Mg Tabs (Clonidine Hcl) .... Take On Tablet By Mouth At Bedtime 3)  Slow Fe 160 (50 Fe) Mg Cr-Tabs (Ferrous Sulfate Dried) .... Take One Tablet By Mouth Once A Day 4)  Cod Liver Oil W/vit A, C & D  Chew (Vitamins A D C) .... Take One Tablet By Mouth Once A Day 5)  Fish Oil 1200 Mg Caps (Omega-3 Fatty  Acids) .... Take One Tablet By Mouth Once A Day 6)  Aspirin 81 Mg Tbec (Aspirin) .... Take One Tablet By Mouth Once A Day 7)  Lotensin 20 Mg Tabs (Benazepril Hcl) .... One Tab By Mouth Qd 8)  Klor-Con M20 20 Meq Cr-Tabs (Potassium Chloride Crys Cr) .... One Tab By Mouth Bid 9)  Hydrochlorothiazide 25 Mg Tabs (Hydrochlorothiazide) .... Take 1 Tablet By Mouth Once A Day 10)  Nitroglycerin 0.4 Mg Subl (Nitroglycerin) .... One Tablet Under Tongue Every 5 Minutes As Needed For Chest Pain---May Repeat Times Three 11)  Sertraline Hcl 25 Mg Tabs (Sertraline Hcl) .... Take 1 Tablet By Mouth Once A Day 12)  Aciphex 20 Mg Tbec (Rabeprazole Sodium) .... Take 1 Daily For Heartburn 13)  Metformin Hcl 500 Mg Tabs (Metformin Hcl) .... Take 1 Tablet By Mouth Once A Day  Allergies (verified): No Known Drug Allergies  Review of Systems      See HPI General:  Denies fever, chills, sweats, anorexia, fatigue, weakness, malaise, weight loss, and sleep disorder. CV:  Denies chest pains, angina, palpitations, syncope, dyspnea on exertion, orthopnea, PND, peripheral edema, and claudication. Resp:  Denies dyspnea at rest, dyspnea with exercise, cough, sputum, wheezing, coughing up blood, and pleurisy. GI:  See HPI; Denies difficulty swallowing, pain on swallowing, jaundice, bloody BM's, black BMs, and fecal incontinence. GU:  Denies urinary burning, blood in urine, nocturnal urination, urinary frequency, and urinary incontinence.  Vital Signs:  Patient profile:  55 year old female Menstrual status:  postmenopausal Height:      59 inches Weight:      140 pounds BMI:     28.38 Temp:     98.3 degrees F oral Pulse rate:   92 / minute BP sitting:   118 / 84  (left arm) Cuff size:   regular  Vitals Entered By: Hendricks Limes LPN (February 27, 2010 11:02 AM)  Physical Exam  General:  Well developed, well nourished, no acute distress. Head:  Normocephalic and atraumatic. Eyes:  Sclera clear no icterus. Ears:   Normal auditory acuity. Nose:  No deformity, discharge,  or lesions. Mouth:  Oropharyngeal mucosa moist, pink.  No lesions, erythema or exudate.    Neck:  Supple; no masses or thyromegaly. Heart:  Regular rate and rhythm; no murmurs, rubs,  or bruits. Abdomen:  Soft, nontender and nondistended. No masses, hepatosplenomegaly or hernias noted. Normal bowel sounds.without guarding and without rebound.   Msk:  No deformity or scoliosis noted of thoracic or lumbar spine.   Pulses:  Normal pulses noted. Extremities:  No clubbing, cyanosis, edema or deformities noted. Neurologic:  Alert and  oriented x4;  grossly normal neurologically. Skin:  Intact without significant lesions or rashes. Cervical Nodes:  No significant cervical adenopathy. Psych:  Alert and cooperative. Normal mood and affect.  Impression & Recommendations:  Problem # 1:  CONSTIPATION (ICD-564.00)  Begin fiber supplement.  Increase miralax to once daily if needed.  Orders: Est. Patient Level III ([address])  Problem # 2:  GERD (ICD-530.81)  Well controolled on Aciphex 20mg  daily.  Orders: Est. Patient Level III ([address])  Problem # 3:  NEOPLASM, MALIGNANT, COLON, FAMILY HX, MOTHER (ICD-V16.0) Assessment: Comment Only  Problem # 4:  ADENOMATOUS COLONIC POLYP (ICD-211.3) Assessment: Comment Only  Orders: Est. Patient Level III ([address])  Patient Instructions: 1)  Samples benefiber given 2)  Office visit 6 months w/ dr Jena Gauss or sooner if needed 3)  Colonoscopy 5 yrs  Appended Document: PP FU 6-8 WEEKS/SS 6 MONTH F/U W/DR Jena Gauss IN IDX/LAW

## 2010-08-21 NOTE — Letter (Signed)
Summary: EGD/ED ORDER  EGD/ED ORDER   Imported By: Ave Filter 12/12/2009 14:34:33  _____________________________________________________________________  External Attachment:    Type:   Image     Comment:   External Document

## 2010-08-21 NOTE — Letter (Signed)
Summary: TCS ORDER  TCS ORDER   Imported By: Ave Filter 12/21/2009 10:26:01  _____________________________________________________________________  External Attachment:    Type:   Image     Comment:   External Document

## 2010-08-21 NOTE — Assessment & Plan Note (Signed)
Summary: rash- room 1   Vital Signs:  Patient profile:   55 year old female Menstrual status:  postmenopausal Height:      59 inches Weight:      133.25 pounds BMI:     27.01 O2 Sat:      99 % on Room air Pulse rate:   82 / minute Resp:     16 per minute BP sitting:   140 / 90  (left arm)  Vitals Entered By: Adella Hare LPN (Dec 13, 2009 8:10 AM) CC: rash on bottom and leg Is Patient Diabetic? No Pain Assessment Patient in pain? no      Comments did not bring meds to ov   Referring Provider:  Tye Savoy Primary Provider:  Syliva Overman  CC:  rash on bottom and leg.  History of Present Illness: Pt states she started with a rash on her Rt calf, that she thinks looks like ringworm.  She now has the same itchy dry rash on her buttock area.  No new soaps, detergents, etc.  She did apply a new arthritis cream to her lower back but not the areas of the rash.  Pt has an appt for EGD tomorrow am.  Aciphex is working better for her but still has decreased appetite.  BM's have improved, more regular now.   Allergies (verified): No Known Drug Allergies  Review of Systems GI:  Complains of loss of appetite; denies indigestion, nausea, and vomiting. Derm:  Complains of itching, lesion(s), and rash.  Physical Exam  General:  Well-developed,well-nourished,in no acute distress; alert,appropriate and cooperative throughout examination Head:  Normocephalic and atraumatic without obvious abnormalities. No apparent alopecia or balding. Extremities:  No PTE Neurologic:  alert & oriented X3, sensation intact to light touch, and gait normal.   Skin:  Rt calf quarter sized annular lesion, erythmatous dry border & central clearing. Buttocks dry mildly erythematous patches noted.  Not annular and no central clearing.  Psych:  Cognition and judgment appear intact. Alert and cooperative with normal attention span and concentration. No apparent delusions, illusions,  hallucinations   Impression & Recommendations:  Problem # 1:  TINEA CORPORIS (ICD-110.5) Assessment New  Problem # 2:  WEIGHT LOSS (ICD-783.21) Assessment: Deteriorated Pt has lost another 1 #. Await EGD results.  Complete Medication List: 1)  Lovastatin 10 Mg Tabs (Lovastatin) .... Take 1 tab by mouth at bedtime 2)  Clonidine Hcl 0.1 Mg Tabs (Clonidine hcl) .... Take on tablet by mouth at bedtime 3)  Slow Fe 160 (50 Fe) Mg Cr-tabs (Ferrous sulfate dried) .... Take one tablet by mouth once a day 4)  Cod Liver Oil W/vit A, C & D Chew (Vitamins a d c) .... Take one tablet by mouth once a day 5)  Fish Oil 1200 Mg Caps (Omega-3 fatty acids) .... Take one tablet by mouth once a day 6)  Aspirin 81 Mg Tbec (Aspirin) .... Take one tablet by mouth once a day 7)  Metformin Hcl 1000 Mg Tabs (Metformin hcl) .... One tab by mouth qd 8)  Lotensin 20 Mg Tabs (Benazepril hcl) .... One tab by mouth qd 9)  Klor-con M20 20 Meq Cr-tabs (Potassium chloride crys cr) .... One tab by mouth bid 10)  Hydrochlorothiazide 25 Mg Tabs (Hydrochlorothiazide) .... Take 1 tablet by mouth once a day 11)  Nitroglycerin 0.4 Mg Subl (Nitroglycerin) .... One tablet under tongue every 5 minutes as needed for chest pain---may repeat times three 12)  Sertraline Hcl 25 Mg Tabs (  Sertraline hcl) .... Take 1 tablet by mouth once a day 13)  Aciphex 20 Mg Tbec (Rabeprazole sodium) .... Take 1 daily for heartburn 14)  Lotrisone 1-0.05 % Crea (Clotrimazole-betamethasone) .... Apply two times a day to rash  Patient Instructions: 1)  Keep your next appt. 2)  If your rash does not clear up in the next 7-10 days call the office. 3)  I have prescribed a cream for your rash. 4)  continue your current medications.  We will await the EGD. Prescriptions: LOTRISONE 1-0.05 % CREA (CLOTRIMAZOLE-BETAMETHASONE) apply two times a day to rash  #30 grams x 0   Entered and Authorized by:   Esperanza Sheets PA   Signed by:   Esperanza Sheets PA on  12/13/2009   Method used:   Electronically to        [facility]  [address] (retail)       [address]       [place], [place]  [address]       Ph: [phone]       Fax: [phone]   RxID:   [id number]

## 2010-08-21 NOTE — Letter (Signed)
Summary: ADD ON LAB  ADD ON LAB   Imported By: Lind Guest 05/03/2010 17:03:38  _____________________________________________________________________  External Attachment:    Type:   Image     Comment:   External Document

## 2010-08-21 NOTE — Progress Notes (Signed)
Summary: MEDS & PAPERS  Phone Note Call from Patient   Reason for Call: Referral Summary of Call: sertraline 25 mg does not help does she really need it aciphex 20 mg benazepril 20 mg hydrochlorothiazide 25 mg lovaSTATIN 20MG  I AM GOING TO HAND YOU THE SHEET TO FILL THIS OUT ON FOR THESE MEDS. Initial call taken by: Lind Guest,  January 08, 2010 4:38 PM  Follow-up for Phone Call        patient can discuss sertraline at time of ov this week  other meds sent Follow-up by: Adella Hare LPN,  January 09, 2010 10:18 AM    Prescriptions: HYDROCHLOROTHIAZIDE 25 MG TABS (HYDROCHLOROTHIAZIDE) Take 1 tablet by mouth once a day  #90 x 0   Entered and Authorized by:   Adella Hare LPN   Signed by:   Adella Hare LPN on 70/62/3762   Method used:   Printed then faxed to ...       [facility]  [address] [address] (retail)       [address]       [place], [place]  [address]       Ph: [phone]       Fax: [phone]   RxID:   [id number] LOTENSIN 20 MG TABS (BENAZEPRIL HCL) one tab by mouth qd  #90 x 0   Entered and Authorized by:   Adella Hare LPN   Signed by:   Adella Hare LPN on 16/96/7893   Method used:   Printed then faxed to ...       [facility]  [address] [address] (retail)       [address]       [place], [place]  [address]       Ph: [phone]       Fax: [phone]   RxID:   [id number] ACIPHEX 20 MG TBEC (RABEPRAZOLE SODIUM) take 1 daily for heartburn  #90 x 0   Entered and Authorized by:   Adella Hare LPN   Signed by:   Adella Hare LPN on 32/67/1245   Method used:   Printed then faxed to ...       [facility]  [address] [place] [address] (retail)       [address]       [place], [place]  [address]       Ph: [phone]       Fax: [phone]   RxID:   [id number] LOVASTATIN 10 MG TABS (LOVASTATIN) Take 1 tab by mouth at bedtime  #90 x 0   Entered and Authorized by:   Adella Hare LPN   Signed by:   Adella Hare LPN on 34/19/6222  Method used:   Printed then faxed to ...       [facility]  [address] [address] (retail)       [address]       [place], [place]  [address]       Ph: [phone]       Fax: [phone]   RxID:   [id number]

## 2010-08-21 NOTE — Letter (Signed)
Summary: Patient Notice, Colon Biopsy Results  [facility]  [address]   [place], [place] [address]   Phone: [phone]  Fax: [phone]       January 06, 2010   Jaime Murray [address] [place], [place]  [address] 02-19-1956    Dear Ms. Resnick,  I am pleased to inform you that the biopsies taken during your recent colonoscopy did not show any evidence of cancer upon pathologic examination.  Additional information/recommendations:  Continue with the treatment plan as outlined on the day of your exam.  You should have a repeat colonoscopy examination  in 5 years.  Please call us if you are having persistent problems or have questions about your condition that have not been fully answered at this time.  Sincerely,    R. Roetta Sessions MD, FACP [facility] Ph: [phone]    Fax: [phone]   Appended Document: Patient Notice, Colon Biopsy Results letter mailed to pt  Appended Document: Patient Notice, Colon Biopsy Results pt aware of appt 7/18 at 11 w/KJ  reminder in computer

## 2010-08-21 NOTE — Letter (Signed)
Summary: review of med list  review of med list   Imported By: Jaime Murray 04/30/2010 13:14:34  _____________________________________________________________________  External Attachment:    Type:   Image     Comment:   External Document

## 2010-08-21 NOTE — Assessment & Plan Note (Signed)
Summary: phy   Vital Signs:  Patient profile:   56 year old female Menstrual status:  postmenopausal Height:      59 inches Weight:      145.75 pounds BMI:     29.54 O2 Sat:      96 % on Room air Pulse rate:   74 / minute Pulse rhythm:   regular Resp:     16 per minute BP sitting:   118 / 82  (left arm) Cuff size:   regular  Nutrition Counseling: Patient's BMI is greater than 25 and therefore counseled on weight management options.  O2 Flow:  Room air CC: CPE , has a cold, green mucus, also has a rash on her lower back that itches and its been there for awhile, the cream the PA gave her didn't work   Primary Care Avan Gullett:  Dr Lodema Hong  CC:  CPE , has a cold, green mucus, also has a rash on her lower back that itches and its been there for awhile, and the cream the PA gave her didn't work.  History of Present Illness: Reports  that she has been doing fairly well.  Denies recent fever or chills.  Denies chest pain, palpitations, PND, orthopnea or leg swelling. Denies abdominal pain, nausea, vomitting, diarrhea or constipation. Denies change in bowel movements or bloody stool. Denies dysuria , frequency, incontinence or hesitancy. Denies  joint pain, swelling, or reduced mobility, except forintermittent ankle and wrist pain and swelling. Denies headaches, vertigo, seizures. Denies depression, anxiety or insomnia. pt denies symptoms of uncontrolled blood sugars      Allergies (verified): No Known Drug Allergies  Review of Systems      See HPI General:  Complains of fatigue. Eyes:  Denies blurring, discharge, eye pain, and red eye. ENT:  Complains of earache, nasal congestion, and sore throat; 1 week history. Resp:  Complains of cough and sputum productive; green sputum x 1 week. MS:  Complains of joint pain; intermittent left ankle swelling and pain, sometimes the right also wrists. Derm:  Complains of changes in color of skin, dryness, and rash; hyperpigmented  puritic rash under breastsd, on abdomen x 1 mth, no improvement with med clotrimaz/betameth. Psych:  Denies anxiety and depression. Endo:  Denies excessive thirst and excessive urination; daily morning sugars checked range is 110 to 120. Heme:  Denies abnormal bruising and bleeding. Allergy:  Denies hives or rash and itching eyes.  Physical Exam  General:  Well-developed,well-nourished,in no acute distress; alert,appropriate and cooperative throughout examination Head:  Normocephalic and atraumatic without obvious abnormalities. No apparent alopecia or balding.maxillary sinus tenderness Eyes:  No corneal or conjunctival inflammation noted. EOMI. Perrla. Funduscopic exam benign, without hemorrhages, exudates or papilledema. Vision grossly normal. Ears:  External ear exam shows no significant lesions or deformities.  Otoscopic examination reveals clear canals, tympanic membranes are intact bilaterally without bulging, retraction, inflammation or discharge. Hearing is grossly normal bilaterally. Nose:  External nasal examination shows no deformity or inflammation. Nasal mucosa are pink and moist without lesions or exudates. Mouth:  Oral mucosa and oropharynx without lesions or exudates.  Teeth in good repair. Neck:  No deformities, masses, or tenderness noted. Chest Wall:  No deformities, masses, or tenderness noted. Breasts:  No mass, nodules, thickening, tenderness, bulging, retraction, inflamation, nipple discharge or skin changes noted.   Lungs:  Normal respiratory effort, chest expands symmetrically. Lungs few scattered crackles, no wheezes Heart:  Normal rate and regular rhythm. S1 and S2 normal without gallop, murmur, click,  rub or other extra sounds. Abdomen:  Bowel sounds positive,abdomen soft and non-tender without masses, organomegaly or hernias noted. Rectal:  No external abnormalities noted. Normal sphincter tone. No rectal masses or tenderness.guaic negative stool Genitalia:  Normal  introitus for age, no external lesions, no vaginal discharge, mucosa pink and moist, no vaginal or cervical lesions, no vaginal atrophy, no friaility or hemorrhage, normal uterus size and position, no adnexal masses or tenderness Msk:  No deformity or scoliosis noted of thoracic or lumbar spine.   Pulses:  R and L carotid,radial,femoral,dorsalis pedis and posterior tibial pulses are full and equal bilaterally Extremities:  No clubbing, cyanosis, edema, or deformity noted with normal full range of motion of all joints.   Neurologic:  No cranial nerve deficits noted. Station and gait are normal. Plantar reflexes are down-going bilaterally. DTRs are symmetrical throughout. Sensory, motor and coordinative functions appear intact. Skin:  hyperpigmented macular rash on lower abdomen and lower back with scaling in some areas. Also present under breasts Cervical Nodes:  No lymphadenopathy noted Axillary Nodes:  No palpable lymphadenopathy Inguinal Nodes:  No significant adenopathy Psych:  Cognition and judgment appear intact. Alert and cooperative with normal attention span and concentration. No apparent delusions, illusions, hallucinations   Impression & Recommendations:  Problem # 1:  TINEA CORPORIS (ICD-110.5) Assessment Improved pt to use antfungal as needed, she was educated that the hyperpigmentation will take time to resolve, hydroquinone prescribed to assist with this  Problem # 2:  GERD (ICD-530.81) Assessment: Improved  Her updated medication list for this problem includes:    Aciphex 20 Mg Tbec (Rabeprazole sodium) .Marland Kitchen... Take 1 daily for heartburn  Problem # 3:  ACUTE MAXILLARY SINUSITIS (ICD-461.0) Assessment: Comment Only  Her updated medication list for this problem includes:    Penicillin V Potassium 500 Mg Tabs (Penicillin v potassium) .Marland Kitchen... Take 1 tablet by mouth three times a day  Problem # 4:  ACUTE BRONCHITIS (ICD-466.0) Assessment: Comment Only  Her updated medication  list for this problem includes:    Penicillin V Potassium 500 Mg Tabs (Penicillin v potassium) .Marland Kitchen... Take 1 tablet by mouth three times a day deslyn 1 tsp 3 times daily for 6 days, samples given  Problem # 5:  DEPRESSION (ICD-311) Assessment: Improved  Her updated medication list for this problem includes:    Sertraline Hcl 25 Mg Tabs (Sertraline hcl) .Marland Kitchen... Take 1 tablet by mouth once a day  Problem # 6:  IMPAIRED GLUCOSE TOLERANCE (ICD-271.3) Assessment: New  Orders: Ophthalmology Referral (Ophthalmology) pt has the dxof DM removed and understands clearly the need for healthy lifestyle to keep it this way  Complete Medication List: 1)  Lovastatin 10 Mg Tabs (Lovastatin) .... Take 1 tab by mouth at bedtime 2)  Clonidine Hcl 0.1 Mg Tabs (Clonidine hcl) .... Take on tablet by mouth at bedtime 3)  Slow Fe 160 (50 Fe) Mg Cr-tabs (Ferrous sulfate dried) .... Take one tablet by mouth once a day 4)  Cod Liver Oil W/vit A, C & D Chew (Vitamins a d c) .... Take one tablet by mouth once a day 5)  Fish Oil 1200 Mg Caps (Omega-3 fatty acids) .... Take one tablet by mouth once a day 6)  Aspirin 81 Mg Tbec (Aspirin) .... Take one tablet by mouth once a day 7)  Lotensin 20 Mg Tabs (Benazepril hcl) .... One tab by mouth qd 8)  Klor-con M20 20 Meq Cr-tabs (Potassium chloride crys cr) .... One tab by mouth bid 9)  Hydrochlorothiazide 25 Mg Tabs (Hydrochlorothiazide) .... Take 1 tablet by mouth once a day 10)  Nitroglycerin 0.4 Mg Subl (Nitroglycerin) .... One tablet under tongue every 5 minutes as needed for chest pain---may repeat times three 11)  Sertraline Hcl 25 Mg Tabs (Sertraline hcl) .... Take 1 tablet by mouth once a day 12)  Aciphex 20 Mg Tbec (Rabeprazole sodium) .... Take 1 daily for heartburn 13)  Penicillin V Potassium 500 Mg Tabs (Penicillin v potassium) .... Take 1 tablet by mouth three times a day 14)  Fluconazole 150 Mg Tabs (Fluconazole) .... Take 1 tablet by mouth once a day as needed  for vaginal itch 15)  Clotrimazole-betamethasone 1-0.05 % Crea (Clotrimazole-betamethasone) .... Apply twice daily as needed to affected areas for itch 16)  Hydroquinone 4 % Crea (Hydroquinone) .... Apply to darkened areas twice daily sparingly for 10 days , then as needed, use is for bleaching dark areas on skin 17)  Folic Acid 1 Mg Tabs (Folic acid) .... Take 1 tablet by mouth once a day  Other Orders: Pap Smear ([address]) Hemoccult Guaiac-1 spec.(in office) ([address]) Radiology Referral (Radiology)  Patient Instructions: 1)  Please schedule a follow-up appointment in 3 months. 2)  It is important that you exercise regularly at least 20 minutes 5 times a week. If you develop chest pain, have severe difficulty breathing, or feel very tired , stop exercising immediately and seek medical attention. 3)  You need to lose weight. Consider a lower calorie diet and regular exercise.  4)  a bleaching agentwillbe sent in for twice daily use sparingly ro rash. 5)  delsyn samples to be given as decongestant, use 1 tsp three times daily for 1 week. 6)  call back in 3 weeks for flu vac. 7)  Mamogram and eye exam referral. 8)  The medication list was reviewed and reconciled..All changed/newly prescribed medications were explained. A complete medication list was provided to the patient/caregiver.  9)  Fastinglabs asap Prescriptions: FOLIC ACID 1 MG TABS (FOLIC ACID) Take 1 tablet by mouth once a day  #30 x 4   Entered and Authorized by:   Syliva Overman MD   Signed by:   Syliva Overman MD on 05/04/2010   Method used:   Printed then faxed to ...       [facility] (retail)       [address]       [place], [place]  [address]       Ph: [phone]       Fax: [phone]   RxID:   [id number] HYDROQUINONE-SUNSCREENS 4 % CREA (HYDROQUINONE-SUNSCREENS) apply sparingly twice daily to rash  #60 gm x 0   Entered and Authorized by:   Syliva Overman MD   Signed by:   Syliva Overman MD on 04/30/2010   Method used:   Electronically to        [facility] (retail)       [address]       [place], [place]  [address]       Ph: [phone]       Fax: [phone]   RxID:   [id number] CLOTRIMAZOLE-BETAMETHASONE 1-0.05 % CREA (CLOTRIMAZOLE-BETAMETHASONE) apply twice daily as needed to affected areas for itch  #45gm x 0   Entered and Authorized by:   Syliva Overman MD   Signed by:   Syliva Overman MD  on 04/30/2010   Method used:   Handwritten   RxID:   [id number] FLUCONAZOLE 150 MG TABS (FLUCONAZOLE) Take 1 tablet by mouth once a day as needed for vaginal itch  #3 x 0   Entered and Authorized by:   Syliva Overman MD   Signed by:   Syliva Overman MD on 04/30/2010   Method used:   Print then Give to Patient   RxID:   [id number] PENICILLIN V POTASSIUM 500 MG TABS (PENICILLIN V POTASSIUM) Take 1 tablet by mouth three times a day  #30 x 0   Entered and Authorized by:   Syliva Overman MD   Signed by:   Syliva Overman MD on 04/30/2010   Method used:   Electronically to        [facility] (retail)       [address]       [place], [place]  [address]       Ph: [phone]       Fax: [phone]   RxID:   [id number]   Laboratory Results  Date/Time Received: April 30, 2010 8:52 AM  Date/Time Reported: April 30, 2010 8:52 AM   Stool - Occult Blood Hemmoccult #1: negative Date: 04/30/2010 Comments: 118 10/12 50201 10L 02/13 Adella Hare LPN  April 30, 2010 8:52 AM

## 2010-08-23 ENCOUNTER — Encounter (INDEPENDENT_AMBULATORY_CARE_PROVIDER_SITE_OTHER): Payer: Self-pay | Admitting: *Deleted

## 2010-08-23 NOTE — Letter (Signed)
Summary: rx renewal  rx renewal   Imported By: Lind Guest 07/25/2010 07:58:53  _____________________________________________________________________  External Attachment:    Type:   Image     Comment:   External Document

## 2010-08-23 NOTE — Medication Information (Signed)
Summary: Tax adviser   Imported By: Lind Guest 08/01/2010 13:03:00  _____________________________________________________________________  External Attachment:    Type:   Image     Comment:   External Document

## 2010-08-29 NOTE — Letter (Signed)
Summary: Recall Office Visit  [facility]  [address]   [place], [place] [address]   Phone: [phone]  Fax: [phone]      August 23, 2010   Jaime Murray [address] [place], [place]  [address] [birth year]   Dear Ms. Moon,   According to our records, it is time for you to schedule a follow-up office visit with [place].   At your convenience, please call [phone] to schedule an office visit. If you have any questions, concerns, or feel that this letter is in error, we would appreciate your call.   Sincerely,    Diana Eves  [facility] Ph: [phone]   Fax: [phone]

## 2010-09-19 ENCOUNTER — Encounter: Payer: Self-pay | Admitting: Gastroenterology

## 2010-09-19 ENCOUNTER — Ambulatory Visit (INDEPENDENT_AMBULATORY_CARE_PROVIDER_SITE_OTHER): Admitting: Gastroenterology

## 2010-09-19 DIAGNOSIS — K219 Gastro-esophageal reflux disease without esophagitis: Secondary | ICD-10-CM

## 2010-09-19 DIAGNOSIS — D649 Anemia, unspecified: Secondary | ICD-10-CM | POA: Insufficient documentation

## 2010-09-19 DIAGNOSIS — D509 Iron deficiency anemia, unspecified: Secondary | ICD-10-CM

## 2010-09-19 DIAGNOSIS — K5900 Constipation, unspecified: Secondary | ICD-10-CM

## 2010-09-20 ENCOUNTER — Encounter: Payer: Self-pay | Admitting: Gastroenterology

## 2010-09-28 LAB — CONVERTED CEMR LAB
Basophils Absolute: 0 10*3/uL (ref 0.0–0.1)
Basophils Relative: 0 % (ref 0–1)
Eosinophils Absolute: 0.5 10*3/uL (ref 0.0–0.7)
Eosinophils Relative: 4 % (ref 0–5)
Folate: 7.7 ng/mL
HCT: 36.3 % (ref 36.0–46.0)
Iron: 26 ug/dL — ABNORMAL LOW (ref 42–145)
MCHC: 31.1 g/dL (ref 30.0–36.0)
MCV: 78.4 fL (ref 78.0–100.0)
Neutrophils Relative %: 66 % (ref 43–77)
Platelets: 390 10*3/uL (ref 150–400)
RDW: 17 % — ABNORMAL HIGH (ref 11.5–15.5)
UIBC: 328 ug/dL
Vitamin B-12: 398 pg/mL (ref 211–911)
WBC: 13 10*3/uL — ABNORMAL HIGH (ref 4.0–10.5)

## 2010-10-02 NOTE — Assessment & Plan Note (Signed)
Summary: OV IN 6 MONTHS ,CONSTIPATION,GERD/LAW   Vital Signs:  Patient profile:   55 year old female Height:      61 inches Weight:      148 pounds BMI:     28.07 Temp:     98.4 degrees F oral Pulse rate:   60 / minute BP sitting:   116 / 70  (left arm)  Vitals Entered By: Carolan Clines LPN (September 19, 2010 1:42 PM)  Visit Type:  Follow-up Visit   History of Present Illness: [name] presents today in f/u for GERD and constipation. taking Aciphex daily, no breakthrough reflux, no dysphagia. Having no problems. Taking Miralax and benefiber; usually has BM 3x/week. Denies constipation. No brbpr, no abdominal pain. Doing well.   Current Medications (verified): 1)  Lovastatin 10 Mg Tabs (Lovastatin) .... Take One Once Daily At Bed 2)  Clonidine Hcl 0.1 Mg Tabs (Clonidine Hcl) .... Take One Once Daily At Bed Time 3)  Slow Fe 160 (50 Fe) Mg Cr-Tabs (Ferrous Sulfate Dried) .... Take One Once Daily 4)  Daily Combo Multi Vitamins  Tabs (Multiple Vitamins-Minerals) .... Take One Once Daily 5)  Fish Oil 1200 Mg Caps (Omega-3 Fatty Acids) .... Take One Once Daily 6)  Aspirin 81 Mg Tbec (Aspirin) .... Take One Once Daily 7)  Lotensin 20 Mg Tabs (Benazepril Hcl) .... Take One Once Daily 8)  Klor-Con 20 Meq Pack (Potassium Chloride) .... Take One Once Daily 9)  Hydrochlorothiazide 25 Mg Tabs (Hydrochlorothiazide) .... Take One Once Daily 10)  Nitroglycerin 0.4 Mg/hr Pt24 (Nitroglycerin) .... Place One Tab Under Tongue Q5 Mins As Needed For Chest Pain 11)  Sertraline Hcl 25 Mg Tabs (Sertraline Hcl) .... Take One Once Daily 12)  Aciphex 20 Mg Tbec (Rabeprazole Sodium) .... Take One Once Daily For Heart Burn 13)  Lotrisone 1-0.05 % Crea (Clotrimazole-Betamethasone) .... Apply Two Times A Day To Rash  Allergies (verified): No Known Drug Allergies  Past History:  Past Medical History: GERD Constipation Fatigue IDA DM HTN Hypercholesterolemia  Review of Systems General:  Denies fever,  chills, and anorexia. Eyes:  Denies blurring, irritation, and discharge. ENT:  Denies sore throat, hoarseness, and difficulty swallowing. CV:  Denies chest pains and syncope. Resp:  Denies dyspnea at rest and wheezing. GI:  See HPI. GU:  Denies urinary burning and urinary frequency. MS:  Denies joint pain / LOM, joint swelling, and joint stiffness. Derm:  Denies rash, itching, and dry skin. Neuro:  Denies weakness and syncope. Psych:  Denies depression and anxiety.  Physical Exam  General:  Well developed, well nourished, no acute distress. Head:  Normocephalic and atraumatic. Eyes:  PERRLA, no icterus. Lungs:  Clear throughout to auscultation. Heart:  Regular rate and rhythm; no murmurs, rubs,  or bruits. Abdomen:  +BS, soft, non-tender, non-distended. no HSM, no rebound or guarding.  Msk:  Symmetrical with no gross deformities. Normal posture. Neurologic:  Alert and  oriented x4;  grossly normal neurologically.   Impression & Recommendations:  Problem # 1:  GERD (ICD-52.30)  55 year old female with hx of GERD, well-controlled on Aciphex. No dysphagia/odynophagia, no breakthrough reflux. Doing well at this time.   Continue Aciphex F/U in 6 mos  Orders: Est. Patient Level II ([address])  Problem # 2:  CONSTIPATION (ICD-564.00)  Chronic constipation, treatment with miralax and benefiber. No brbpr or melena. No abdominal pain.  Add probiotic Continue miralax and benefiber f/u in 6 mos  Orders: Est. Patient Level II ([address])  Problem # 3:  ANEMIA-IRON DEFICIENCY (ICD-280.9)  hx of IDA. Last labs in Oct 2011. H/H 10.5/32.1, Iron 27, %sat 7, folate 2.3, ferritin 9. Iron supplements daily.   Recheck CBC, anemia panel F/U in 6 mos  Orders: Est. Patient Level II ([address])  Other Orders: T-CBC w/Diff [phone]) T-Iron [id number]) T-Iron Binding Capacity (TIBC) ([address]) T-Ferritin (69629-52841) T-Folate [id number]) T-B12, Serum Total Only ([address])   Orders  Added: 1)  T-CBC w/Diff [53664-40347] 2)  T-Iron [[id number]] 3)  T-Iron Binding Capacity (TIBC) [[address]] 4)  T-Ferritin [84166-06301] 5)  T-Folate [23340] 6)  T-B12, Serum Total Only [82607] 7)  Est. Patient Level II [[address]]  Appended Document: OV IN 6 MONTHS ,CONSTIPATION,GERD/LAW 6 month f/u opv reminder is in the computer

## 2010-10-19 ENCOUNTER — Encounter: Payer: Self-pay | Admitting: Family Medicine

## 2010-10-19 ENCOUNTER — Telehealth: Payer: Self-pay | Admitting: Family Medicine

## 2010-10-19 ENCOUNTER — Other Ambulatory Visit: Payer: Self-pay

## 2010-10-19 MED ORDER — BENAZEPRIL HCL 20 MG PO TABS: 20.0000 mg | ORAL_TABLET | Freq: Every day | ORAL | Status: DC

## 2010-10-19 MED ORDER — ALIGN PO CAPS: 1.0000 | ORAL_CAPSULE | Freq: Every day | ORAL | Status: AC

## 2010-10-19 MED ORDER — CLONIDINE HCL 0.1 MG PO TABS: 0.1000 mg | ORAL_TABLET | Freq: Every day | ORAL | Status: DC

## 2010-10-19 MED ORDER — LOVASTATIN 10 MG PO TABS: 10.0000 mg | ORAL_TABLET | Freq: Every day | ORAL | Status: DC

## 2010-10-19 MED ORDER — HYDROCHLOROTHIAZIDE 25 MG PO TABS: 25.0000 mg | ORAL_TABLET | Freq: Every day | ORAL | Status: DC

## 2010-10-19 NOTE — Telephone Encounter (Signed)
[facility]

## 2010-10-19 NOTE — Telephone Encounter (Signed)
Patient called in and said she brought in about four prescriptions over a week ago, she [facility].  She said that her husband is at home if you need to call you can talk to him.  When I asked her what the medication was she couldn't tell me.

## 2010-12-04 NOTE — Letter (Signed)
February 17, 2009    Jaime Murray. Jaime Murray, M.D.  [address], [address]  [place], [place] [address]   RE:  Jaime, Murray  MRN:  [id number]  /  DOB:  1955-08-08   Dear Jaime Murray,   It is my pleasure to evaluate Jaime Murray in the office today in  consultation at your request for chest discomfort and EKG abnormalities.  As you know, this nice woman has multiple cardiovascular risk factors  including hypertension, hyperlipidemia, diabetes, cigarette use, and  probable postmenopausal status.  She developed chest discomfort last  night, lasting approximately 40 minutes.  This was localized in the left  upper chest without radiation.  There was no associated dyspnea nor  diaphoresis.  She was at rest when symptoms developed.  The quality was  stabbing and intermittent.  Resolution was spontaneous - she was unable  to find anything to make the pain better or worse.  She has some  residual tenderness in the same region, but pressure does not reproduce  her initial symptoms.   This morning when she awoke, she noted numbness in the left upper  extremity and left leg.  She was able to walk normally and function  normally.  There were no other neurologic symptoms.  Those symptoms  faded over the course of few hours.  At the present time, she feels  fine.   She was seen by Dr. Domingo Sep a few years ago for chest discomfort.  We  obtained records of that evaluation but were given only a stress test  report and a nuclear medicine scan report.  She achieved a reasonable  level of exercise with appropriate heart rate.  There were neither  electrocardiographic nor scintigraphic signs of cardiac disease.  Her  EKG was read as showing nonspecific ST-T wave abnormalities at that time  but no tracing is available for review.  Likewise, your office reports  that no prior EKGs are available there.   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  She has never been hospitalized.   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  She exercises on a regular basis.  Cigarette smoking  20-30 pack years continuing one-half pack per day.  No excessive use of  alcohol.   FAMILY HISTORY:  Father has heart disease and has had a percutaneous  intervention.  Mother is alive and well.  Of 5 sisters, 1 has cardiac  problems.   REVIEW OF SYSTEMS:  Notable for occasional headaches, the need for  corrective lenses, occasional palpitations, GERD, and intermittent  arthritic discomfort that is diffuse.  All other systems reviewed and  are negative.   MEDICATIONS:  1. Lovastatin 20 mg daily.  2. Hydrochlorothiazide 25 mg daily.  3. Clonidine 0.05 mg daily.  4. Metformin 500 mg b.i.d., iron supplement 1 every other day.  5. Fish oil 1200 mg daily.  6. Aspirin 81 mg daily.  7. Omeprazole 40 mg daily.  8. Benazepril 20 mg daily.  9. She uses Tylenol and Motrin p.r.n.   She has no known medical allergies.   PHYSICAL EXAMINATION:  GENERAL:  Very pleasant, somewhat overweight  woman in no acute distress.  VITAL SIGNS:  Weight is 147 pounds, blood pressure 110/80, heart rate 92  and regular, respirations 12 and unlabored.  HEENT:  EOMs full; normal lids and conjunctivae; normal oral mucosa;  teeth in  good repair.  NECK:  No jugular venous distention; normal carotid upstrokes without  bruits.  ENDOCRINE:  Borderline diffuse thyroid enlargement.  HEMATOPOIETIC:  No adenopathy.  SKIN:  No significant lesions.  LUNGS:  Clear.  CARDIAC:  Normal first and second heart sounds.  ABDOMEN:  Soft and nontender; no organomegaly.  EXTREMITIES:  No edema; normal distal pulses.  NEUROLOGIC:  Symmetric strength and tone; normal cranial nerves.   EKG:  Normal sinus rhythm; left atrial abnormality; right ventricular  conduction delay; markedly delayed R-wave progression - cannot exclude  previous  anterior myocardial infarction; diffuse T-wave inversion -  possible apical ischemia.   A troponin level was obtained after the patient was seen in your office.  The value was normal.   IMPRESSION:  Jaime Murray presents with atypical chest discomfort that  probably does not represent myocardial ischemia.  Troponin level is  somewhat reassuring.  She is already taking aspirin.  I have given a  prescription for nitroglycerin to try should chest discomfort recur.   For left-sided paresthesias or hypoesthesia was certainly unrelated to  any possible cardiac disorder.  She has no apparent circulatory  problems.  These could reflect a peripheral neuropathy, less likely a  TIA.  Carotid ultrasound may be warranted, particularly if symptoms  recur.  There is no urgency to perform this test at present.   Her EKG changes are very impressive.  I discussed possible evaluation to  include stress testing or catheterization.  In light of her many risk  factors, I was more inclined to proceed with catheterization.  The  patient's husband was adamant that stress testing should be the first  exam.  Accordingly, we will proceed with a stress echocardiogram at the  patient's earliest convenience.  I will reassess her after that study  has been completed.   Thanks, so much for sending this nice woman to see me.    Sincerely,      Gerrit Friends. Dietrich Pates, MD, [facility]  Electronically Signed    RMR/MedQ  DD: 02/17/2009  DT: 02/18/2009  Job #: [id number]

## 2010-12-04 NOTE — H&P (Signed)
Jaime Murray, DERENZO             ACCOUNT NO.:  [id number]   MEDICAL RECORD NO.:  [id number]          PATIENT TYPE:  AMB   LOCATION:  DAY                           FACILITY:  [facility]   PHYSICIAN:  Dalia Heading, M.D.  DATE OF BIRTH:  08-27-1955   DATE OF ADMISSION:  DATE OF DISCHARGE:  LH                              HISTORY & PHYSICAL   CHIEF COMPLAINT:  Anemia, family history of colon carcinoma.   HISTORY OF PRESENT ILLNESS:  The patient is a 55 year old black female  who is referred for endoscopic evaluation.  She needs colonoscopy for  anemia of unknown etiology.  She has occasional right lower quadrant  abdominal pain.  No weight loss, nausea, vomiting, diarrhea,  constipation, melena, or hematochezia have been noted.  She has never  had a colonoscopy.  Her mother has a history of colon carcinoma.   PAST MEDICAL HISTORY:  Hypertension and non-insulin-dependent diabetes  mellitus.   PAST SURGICAL HISTORY:  Unremarkable.   CURRENT MEDICATIONS:  1. Hydrochlorothiazide 25 mg p.o. daily.  2. Metformin 500 mg p.o. daily.  3. Lovastatin 10 mg p.o. daily.  4. Clonidine 0.1 mg p.o. daily.   ALLERGIES:  No known drug allergies.   REVIEW OF SYSTEMS:  The patient smokes a half pack of cigarettes a day.  She denies any significant alcohol use.  She denies any other  cardiopulmonary difficulties or bleeding disorders.   PHYSICAL EXAMINATION:  GENERAL:  The patient is a well-developed and  well-nourished black female in no acute distress.  LUNGS:  Clear to auscultation with equal breath sounds bilaterally.  HEART:  Regular rate and rhythm without S3, S4, or murmurs.  ABDOMEN:  Soft, nontender, and nondistended.  No hepatosplenomegaly or  masses noted.  RECTAL:  Deferred to the procedure.   IMPRESSION:  Anemia, family history of colon carcinoma.   PLAN:  The patient was scheduled for a colonoscopy on March 15, 2008.  The risks and benefits of the procedure including bleeding  and  perforation were fully explained to the patient who gave informed  consent.      Dalia Heading, M.D.  Electronically Signed     MAJ/MEDQ  D:  03/10/2008  T:  03/11/2008  Job:  [id number]   cc:   Milus Mallick. Lodema Hong, M.D.  Fax: [phone]

## 2010-12-07 NOTE — Procedures (Signed)
Jaime Murray, Jaime Murray             ACCOUNT NO.:  [id number]   MEDICAL RECORD NO.:  [id number]          PATIENT TYPE:  REC   LOCATION:                                FACILITY:  [facility]   PHYSICIAN:  Dani Gobble, MD       DATE OF BIRTH:  06/18/1956   DATE OF PROCEDURE:  03/20/2005  DATE OF DISCHARGE:                                    STRESS TEST   INDICATIONS FOR PROCEDURE:  Chest discomfort.   HEMODYNAMICS/ELECTROCARDIOGRAPHIC DATA:  Baseline blood pressure was 118/78  mmHg with a pulse of 68 beats per minute.  Baseline 12 lead EKG revealed  normal sinus rhythm with nonspecific ST changes and rightward axis.  She  walked for 7 minutes 55 seconds on a full Bruce protocol surpassing her  target heart rate of 147 beats per minute, proceeding to a peak heart rate  of 155 beats per minute.  She experienced some leg discomfort and shortness  of breath but no chest discomfort.  No significant arrhythmias were  identified.  EKG at peak heart rate revealed sinus tachycardia with  continued nonspecific ST changes but no acute ST changes.  EKG during  recovery returned to normal within 1-2 minutes.  There were no additional  EKG changes noted during the recovery phase.   IMPRESSION:  1.  Clinically, negative for angina.  2.  Electrocardiogram negative for ischemia.  3.  Fair overall exercise tolerance.  4.  Scintigraphic images are pending.           ______________________________  Dani Gobble, MD     AB/MEDQ  D:  03/20/2005  T:  03/20/2005  Job:  [id number]   cc:   Milus Mallick. Lodema Hong, M.D.  [address]  [place], [place] [address]  Fax: [phone]

## 2010-12-13 ENCOUNTER — Other Ambulatory Visit: Payer: Self-pay

## 2010-12-13 MED ORDER — HYDROCHLOROTHIAZIDE 25 MG PO TABS: 25.0000 mg | ORAL_TABLET | Freq: Every day | ORAL | Status: DC

## 2010-12-13 MED ORDER — LOVASTATIN 10 MG PO TABS: 10.0000 mg | ORAL_TABLET | Freq: Every day | ORAL | Status: DC

## 2010-12-13 MED ORDER — SERTRALINE HCL 25 MG PO TABS: 25.0000 mg | ORAL_TABLET | Freq: Every day | ORAL | Status: DC

## 2010-12-13 MED ORDER — CLONIDINE HCL 0.1 MG PO TABS: 0.1000 mg | ORAL_TABLET | Freq: Every day | ORAL | Status: DC

## 2010-12-13 MED ORDER — BENAZEPRIL HCL 20 MG PO TABS: 20.0000 mg | ORAL_TABLET | Freq: Every day | ORAL | Status: DC

## 2011-02-12 ENCOUNTER — Other Ambulatory Visit: Payer: Self-pay | Admitting: *Deleted

## 2011-02-12 MED ORDER — POTASSIUM CHLORIDE CRYS ER 20 MEQ PO TBCR: 20.0000 meq | EXTENDED_RELEASE_TABLET | Freq: Two times a day (BID) | ORAL | Status: DC

## 2011-02-25 ENCOUNTER — Encounter: Payer: Self-pay | Admitting: Family Medicine

## 2011-02-26 ENCOUNTER — Ambulatory Visit (INDEPENDENT_AMBULATORY_CARE_PROVIDER_SITE_OTHER): Admitting: Family Medicine

## 2011-02-26 ENCOUNTER — Encounter: Payer: Self-pay | Admitting: Family Medicine

## 2011-02-26 VITALS — BP 110/80 | HR 73 | Resp 16 | Ht 62.0 in | Wt 145.8 lb

## 2011-02-26 DIAGNOSIS — R7303 Prediabetes: Secondary | ICD-10-CM

## 2011-02-26 DIAGNOSIS — F17200 Nicotine dependence, unspecified, uncomplicated: Secondary | ICD-10-CM

## 2011-02-26 DIAGNOSIS — I10 Essential (primary) hypertension: Secondary | ICD-10-CM

## 2011-02-26 DIAGNOSIS — R7309 Other abnormal glucose: Secondary | ICD-10-CM

## 2011-02-26 DIAGNOSIS — E119 Type 2 diabetes mellitus without complications: Secondary | ICD-10-CM

## 2011-02-26 DIAGNOSIS — E785 Hyperlipidemia, unspecified: Secondary | ICD-10-CM

## 2011-02-26 MED ORDER — BENAZEPRIL HCL 20 MG PO TABS: 20.0000 mg | ORAL_TABLET | Freq: Every day | ORAL | Status: DC

## 2011-02-26 MED ORDER — LOVASTATIN 10 MG PO TABS: 10.0000 mg | ORAL_TABLET | Freq: Every day | ORAL | Status: DC

## 2011-02-26 MED ORDER — POTASSIUM CHLORIDE CRYS ER 20 MEQ PO TBCR: 20.0000 meq | EXTENDED_RELEASE_TABLET | Freq: Two times a day (BID) | ORAL | Status: DC

## 2011-02-26 MED ORDER — RABEPRAZOLE SODIUM 20 MG PO TBEC: DELAYED_RELEASE_TABLET | ORAL | Status: DC

## 2011-02-26 MED ORDER — HYDROCHLOROTHIAZIDE 25 MG PO TABS: 25.0000 mg | ORAL_TABLET | Freq: Every day | ORAL | Status: DC

## 2011-02-26 MED ORDER — CLONIDINE HCL 0.1 MG PO TABS: 0.1000 mg | ORAL_TABLET | Freq: Every day | ORAL | Status: DC

## 2011-02-26 NOTE — Patient Instructions (Signed)
CPE in 3.5 month.  It is important that you exercise regularly at least 30 minutes 5 times a week. If you develop chest pain, have severe difficulty breathing, or feel very tired, stop exercising immediately and seek medical attention    A healthy diet is rich in fruit, vegetables and whole grains. Poultry fish, nuts and beans are a healthy choice for protein rather then red meat. A low sodium diet and drinking 64 ounces of water daily is generally recommended. Oils and sweet should be limited. Carbohydrates especially for those who are diabetic or overweight, should be limited to 34-45 gram per meal. It is important to eat on a regular schedule, at least 3 times daily. Snacks should be primarily fruits, vegetables or nuts.    Please think about quitting smoking.  This is very important for your health.  Consider setting a quit date, then cutting back or switching brands to prepare to stop.  Also think of the money you will save every day by not smoking.  Quick Tips to Quit Smoking: Fix a date i.e. keep a date in mind from when you would not touch a tobacco product to smoke  Keep yourself busy and block your mind with work loads or reading books or watching movies in malls where smoking is not allowed  Vanish off the things which reminds you about smoking for example match box, or your favorite lighter, or the pipe you used for smoking, or your favorite jeans and shirt with which you used to enjoy smoking, or the club where you used to do smoking  Try to avoid certain people places and incidences where and with whom smoking is a common factor to add on  Praise yourself with some token gifts from the money you saved by stopping smoking  Anti Smoking teams are there to help you. Join their programs  Anti-smoking Gums are there in many medical shops. Try them to quit smoking   Side-effects of Smoking: Disease caused by smoking cigarettes are emphysema, bronchitis, heart failures  Premature death    Cancer is the major side effect of smoking  Heart attacks and strokes are the quick effects of smoking causing sudden death  Some smokers lives end up with limbs amputated  Breathing problem or fast breathing is another side effect of smoking  Due to more intakes of smokes, carbon mono-oxide goes into your brain and other muscles of the body which leads to swelling of the veins and blockage to the air passage to lungs  Carbon monoxide blocks blood vessels which leads to blockage in the flow of blood to different major body organs like heart lungs and thus leads to attacks and deaths  During pregnancy smoking is very harmful and leads to premature birth of the infant, spontaneous abortions, low weight of the infant during birth  Fat depositions to narrow and blocked blood vessels causing heart attacks  In many cases cigarette smoking caused infertility in men     CMP lipid, HBa1C asap

## 2011-02-26 NOTE — Assessment & Plan Note (Signed)
Unchanged, not interested in quitting at this time

## 2011-02-26 NOTE — Progress Notes (Signed)
  Subjective:    Patient ID: Jaime Murray, female    DOB: October 19, 1955, 55 y.o.   MRN: [id number]  HPI The PT is here for follow up and re-evaluation of chronic medical conditions, medication management and review of any available recent lab and radiology data.  Preventive health is updated, specifically  Cancer screening and Immunization.   Questions or concerns regarding consultations or procedures which the PT has had in the interim are  addressed. The PT denies any adverse reactions to current medications since the last visit.  There are no new concerns.  There are no specific complaints       Review of Systems Denies recent fever or chills. Denies sinus pressure, nasal congestion, ear pain or sore throat. Denies chest congestion, productive cough or wheezing. Denies chest pains, palpitations and leg swelling Denies abdominal pain, nausea, vomiting,diarrhea or constipation.   Denies dysuria, frequency, hesitancy or incontinence. Denies joint pain, swelling and limitation in mobility. Denies headaches, seizures, numbness, or tingling. Denies depression, anxiety or insomnia. Denies skin break down or rash.        Objective:   Physical Exam Patient alert and oriented and in no cardiopulmonary distress.  HEENT: No facial asymmetry, EOMI, no sinus tenderness,  oropharynx pink and moist.  Neck supple no adenopathy.  Chest: Clear to auscultation bilaterally.  CVS: S1, S2 no murmurs, no S3.  ABD: Soft non tender. Bowel sounds normal.  Ext: No edema  MS: Adequate ROM spine, shoulders, hips and knees.  Skin: Intact, no ulcerations or rash noted.  Psych: Good eye contact, normal affect. Memory intact not anxious or depressed appearing.  CNS: CN 2-12 intact, power, tone and sensation normal throughout.        Assessment & Plan:

## 2011-02-26 NOTE — Assessment & Plan Note (Signed)
Low fat diet discussed and encouraged, labs due 

## 2011-02-26 NOTE — Assessment & Plan Note (Signed)
Past h/o diabetes, now diet controlled, rept HBA1C needed

## 2011-02-27 LAB — HEMOGLOBIN A1C: Hgb A1c MFr Bld: 7.2 % — ABNORMAL HIGH (ref <5.7)

## 2011-02-27 LAB — LIPID PANEL: LDL Cholesterol: 124 mg/dL — ABNORMAL HIGH (ref 0–99)

## 2011-02-28 ENCOUNTER — Ambulatory Visit (HOSPITAL_COMMUNITY)
Admission: RE | Admit: 2011-02-28 | Discharge: 2011-02-28 | Disposition: A | Discharge status: Home / Self Care | Source: Ambulatory Visit | Attending: Family Medicine | Admitting: Family Medicine

## 2011-02-28 DIAGNOSIS — J449 Chronic obstructive pulmonary disease, unspecified: Secondary | ICD-10-CM | POA: Insufficient documentation

## 2011-02-28 DIAGNOSIS — I10 Essential (primary) hypertension: Secondary | ICD-10-CM | POA: Insufficient documentation

## 2011-02-28 DIAGNOSIS — F17200 Nicotine dependence, unspecified, uncomplicated: Secondary | ICD-10-CM | POA: Insufficient documentation

## 2011-02-28 DIAGNOSIS — J4489 Other specified chronic obstructive pulmonary disease: Secondary | ICD-10-CM | POA: Insufficient documentation

## 2011-02-28 LAB — COMPLETE METABOLIC PANEL WITH GFR
ALT: 15 U/L (ref 0–35)
Albumin: 3.8 g/dL (ref 3.5–5.2)
CO2: 25 mEq/L (ref 19–32)
Calcium: 9.4 mg/dL (ref 8.4–10.5)
Chloride: 105 mEq/L (ref 96–112)
GFR, Est African American: >60 mL/min (ref >60)
Potassium: 4.4 mEq/L (ref 3.5–5.3)
Sodium: 141 mEq/L (ref 135–145)
Total Protein: 6.7 g/dL (ref 6.0–8.3)

## 2011-04-03 ENCOUNTER — Ambulatory Visit (INDEPENDENT_AMBULATORY_CARE_PROVIDER_SITE_OTHER)

## 2011-04-03 DIAGNOSIS — Z23 Encounter for immunization: Secondary | ICD-10-CM

## 2011-04-03 MED ORDER — INFLUENZA VAC TYPES A & B PF IM SUSP: 0.5000 mL | Freq: Once | INTRAMUSCULAR | Status: DC

## 2011-04-07 NOTE — Progress Notes (Signed)
  Subjective:    Patient ID: Jaime Murray, female    DOB: [birth year], 55 y.o.   MRN: [id number]  HPI    Review of Systems     Objective:   Physical Exam        Assessment & Plan:  Vaccine administered with no adverse effects

## 2011-04-18 LAB — INFLUENZA A+B VIRUS AG-DIRECT(RAPID): Influenza B Ag: NEGATIVE

## 2011-05-29 ENCOUNTER — Encounter: Payer: Self-pay | Admitting: Family Medicine

## 2011-05-30 ENCOUNTER — Ambulatory Visit (INDEPENDENT_AMBULATORY_CARE_PROVIDER_SITE_OTHER): Payer: PRIVATE HEALTH INSURANCE | Admitting: Family Medicine

## 2011-05-30 ENCOUNTER — Encounter: Payer: Self-pay | Admitting: Family Medicine

## 2011-05-30 ENCOUNTER — Other Ambulatory Visit (HOSPITAL_COMMUNITY)
Admission: RE | Admit: 2011-05-30 | Discharge: 2011-05-30 | Disposition: A | Discharge status: Home / Self Care | Payer: PRIVATE HEALTH INSURANCE | Source: Ambulatory Visit | Attending: Family Medicine | Admitting: Family Medicine

## 2011-05-30 VITALS — BP 120/70 | HR 83 | Resp 16 | Ht 62.0 in | Wt 149.1 lb

## 2011-05-30 DIAGNOSIS — Z01419 Encounter for gynecological examination (general) (routine) without abnormal findings: Secondary | ICD-10-CM | POA: Insufficient documentation

## 2011-05-30 DIAGNOSIS — R5381 Other malaise: Secondary | ICD-10-CM

## 2011-05-30 DIAGNOSIS — E785 Hyperlipidemia, unspecified: Secondary | ICD-10-CM

## 2011-05-30 DIAGNOSIS — R5383 Other fatigue: Secondary | ICD-10-CM

## 2011-05-30 DIAGNOSIS — E119 Type 2 diabetes mellitus without complications: Secondary | ICD-10-CM

## 2011-05-30 DIAGNOSIS — F17200 Nicotine dependence, unspecified, uncomplicated: Secondary | ICD-10-CM

## 2011-05-30 DIAGNOSIS — F329 Major depressive disorder, single episode, unspecified: Secondary | ICD-10-CM

## 2011-05-30 DIAGNOSIS — I10 Essential (primary) hypertension: Secondary | ICD-10-CM

## 2011-05-30 DIAGNOSIS — Z1211 Encounter for screening for malignant neoplasm of colon: Secondary | ICD-10-CM

## 2011-05-30 DIAGNOSIS — Z0000 Encounter for general adult medical examination without abnormal findings: Secondary | ICD-10-CM

## 2011-05-30 DIAGNOSIS — D509 Iron deficiency anemia, unspecified: Secondary | ICD-10-CM

## 2011-05-30 LAB — HEMOCCULT GUIAC POC 1CARD (OFFICE): Fecal Occult Blood, POC: NEGATIVE

## 2011-05-30 LAB — HEMOGLOBIN A1C: Hgb A1c MFr Bld: 6.7 % — ABNORMAL HIGH (ref <5.7)

## 2011-05-30 MED ORDER — HYDROCHLOROTHIAZIDE 25 MG PO TABS: 25.0000 mg | ORAL_TABLET | Freq: Every day | ORAL | Status: DC

## 2011-05-30 MED ORDER — POTASSIUM CHLORIDE CRYS ER 20 MEQ PO TBCR: 20.0000 meq | EXTENDED_RELEASE_TABLET | Freq: Two times a day (BID) | ORAL | Status: DC

## 2011-05-30 MED ORDER — LOVASTATIN 20 MG PO TABS: 20.0000 mg | ORAL_TABLET | Freq: Every day | ORAL | Status: DC

## 2011-05-30 MED ORDER — BENAZEPRIL HCL 20 MG PO TABS: 20.0000 mg | ORAL_TABLET | Freq: Every day | ORAL | Status: DC

## 2011-05-30 MED ORDER — CLONIDINE HCL 0.1 MG PO TABS: 0.1000 mg | ORAL_TABLET | Freq: Every day | ORAL | Status: DC

## 2011-05-30 MED ORDER — SERTRALINE HCL 25 MG PO TABS: 25.0000 mg | ORAL_TABLET | Freq: Every day | ORAL | Status: DC

## 2011-05-30 MED ORDER — RABEPRAZOLE SODIUM 20 MG PO TBEC: DELAYED_RELEASE_TABLET | ORAL | Status: DC

## 2011-05-30 NOTE — Patient Instructions (Addendum)
F/u  In 3.5 months  You are again diabetic. You need to resume medication and need to test blood sugars once daily. We wioll call you with result to let you know med dose.  Goal for fasting blood sugar ranges from 80 to 120 and 2 hours after any meal or at bedtime should be between 130 to 170.   Increase lovastatin dose to 20 mg   A healthy diet is rich in fruit, vegetables and whole grains. Poultry fish, nuts and beans are a healthy choice for protein rather then red meat. A low sodium diet and drinking 64 ounces of water daily is generally recommended. Oils and sweet should be limited. Carbohydrates especially for those who are diabetic or overweight, should be limited to 30-45 gram per meal. It is important to eat on a regular schedule, at least 3 times daily. Snacks should be primarily fruits, vegetables or nuts.  It is important that you exercise regularly at least 30 minutes 5 times a week. If you develop chest pain, have severe difficulty breathing, or feel very tired, stop exercising immediately and seek medical attention    Cut back by 1 cigarete per month as discussed, in march you should be no more than 6 per day Labs today.  Fasting labs before next visit  Take iron tablet OTC one daily yoor iron is low, eg slow iron

## 2011-05-30 NOTE — Progress Notes (Signed)
  Subjective:    Patient ID: Jaime Murray, female    DOB: Sep 19, 1955, 55 y.o.   MRN: [id number]  HPI The PT is here for annual exam  and re-evaluation of chronic medical conditions, medication management and review of any available recent lab and radiology data.  Preventive health is updated, specifically  Cancer screening and Immunization.   Questions or concerns regarding consultations or procedures which the PT has had in the interim are  addressed. The PT denies any adverse reactions to current medications since the last visit. She has stopped taking mication for diabetes, and denies polyuria, polydipsia, or blurred vision There are no new concerns.  There are no specific complaints  Smoking is unchanged , unwilling to set quit date, but open to gradually reducing number smoked per day      Review of Systems See HPI Denies recent fever or chills. Denies sinus pressure, nasal congestion, ear pain or sore throat. Denies chest congestion, productive cough or wheezing. Denies chest pains, palpitations and leg swelling Denies abdominal pain, nausea, vomiting,diarrhea or constipation.   Denies dysuria, frequency, hesitancy or incontinence. Denies joint pain, swelling and limitation in mobility. Denies headaches, seizures, numbness, or tingling. Denies depression, anxiety or insomnia. Denies skin break down or rash.        Objective:   Physical Exam Pleasant well nourished female, alert and oriented x 3, in no cardio-pulmonary distress. Afebrile. HEENT No facial trauma or asymetry. Sinuses non tender.  EOMI, PERTL, fundoscopic exam is normal, no hemorhage or exudate.  External ears normal, tympanic membranes clear. Oropharynx moist, no exudate, good dentition. Neck: supple, no adenopathy,JVD or thyromegaly.No bruits.  Chest: Clear to ascultation bilaterally.No crackles or wheezes. Non tender to palpation  Breast: No asymetry,no masses. No nipple discharge or  inversion. No axillary or supraclavicular adenopathy  Cardiovascular system; Heart sounds normal,  S1 and  S2 ,no S3.  No murmur, or thrill. Apical beat not displaced Peripheral pulses normal.  Abdomen: Soft, non tender, no organomegaly or masses. No bruits. Bowel sounds normal. No guarding, tenderness or rebound.  Rectal:  No mass. Guaiac negative stool.  GU: External genitalia normal. No lesions. Vaginal canal normal.No discharge. Uterus normal size, no adnexal masses, no cervical motion or adnexal tenderness.  Musculoskeletal exam: Full ROM of spine, hips , shoulders and knees. No deformity ,swelling or crepitus noted. No muscle wasting or atrophy.   Neurologic: Cranial nerves 2 to 12 intact. Power, tone ,sensation and reflexes normal throughout. No disturbance in gait. No tremor.  Skin: Intact, no ulceration, erythema , scaling or rash noted. Pigmentation normal throughout  Psych; Normal mood and affect. Judgement and concentration normal Diabetic Foot Check:  Appearance - no lesions, ulcers or calluses Skin - no unusual pallor or redness Sensation - grossly intact to light touch Monofilament testing -  Right - Great toe, medial, central, lateral ball and posterior foot intact Left - Great toe, medial, central, lateral ball and posterior foot intact Pulses Left - Dorsalis Pedis and Posterior Tibia normal Right - Dorsalis Pedis and Posterior Tibia normal        Assessment & Plan:

## 2011-05-31 LAB — BASIC METABOLIC PANEL
BUN: 6 mg/dL (ref 6–23)
Chloride: 103 mEq/L (ref 96–112)
Creat: 0.58 mg/dL (ref 0.50–1.10)

## 2011-05-31 LAB — MICROALBUMIN / CREATININE URINE RATIO
Microalb Creat Ratio: 4.7 mg/g (ref 0.0–30.0)
Microalb, Ur: 1.59 mg/dL (ref 0.00–1.89)

## 2011-06-02 ENCOUNTER — Other Ambulatory Visit: Payer: Self-pay | Admitting: Family Medicine

## 2011-06-02 NOTE — Assessment & Plan Note (Signed)
Controlled, no change in medication  

## 2011-06-02 NOTE — Assessment & Plan Note (Signed)
Unchanged, counseled to reduce nicotine usage

## 2011-06-02 NOTE — Assessment & Plan Note (Signed)
Improve with dietary change , not necessary to resume med at this time

## 2011-06-02 NOTE — Assessment & Plan Note (Signed)
Uncontrolled with elevated LDL in light of diabetic/prediabetic status, closer attention to low fat diet , and dose increase on lovastatin

## 2011-06-10 ENCOUNTER — Telehealth: Payer: Self-pay | Admitting: Family Medicine

## 2011-06-10 ENCOUNTER — Other Ambulatory Visit: Payer: Self-pay

## 2011-06-10 MED ORDER — CLOTRIMAZOLE-BETAMETHASONE 1-0.05 % EX CREA: TOPICAL_CREAM | Freq: Two times a day (BID) | CUTANEOUS | Status: DC

## 2011-06-10 NOTE — Telephone Encounter (Signed)
States you were supposed to send her in a cream to [facility] when she was here a few days ago

## 2011-06-10 NOTE — Telephone Encounter (Signed)
Jaime Murray is in med history, pls send that script to [facility] and let her know

## 2011-09-03 ENCOUNTER — Encounter: Payer: Self-pay | Admitting: Family Medicine

## 2011-09-03 ENCOUNTER — Ambulatory Visit (INDEPENDENT_AMBULATORY_CARE_PROVIDER_SITE_OTHER): Payer: PRIVATE HEALTH INSURANCE | Admitting: Family Medicine

## 2011-09-03 VITALS — BP 112/74 | HR 74 | Resp 16 | Ht 62.0 in | Wt 146.4 lb

## 2011-09-03 DIAGNOSIS — D509 Iron deficiency anemia, unspecified: Secondary | ICD-10-CM

## 2011-09-03 DIAGNOSIS — E119 Type 2 diabetes mellitus without complications: Secondary | ICD-10-CM

## 2011-09-03 DIAGNOSIS — F17200 Nicotine dependence, unspecified, uncomplicated: Secondary | ICD-10-CM

## 2011-09-03 DIAGNOSIS — I10 Essential (primary) hypertension: Secondary | ICD-10-CM

## 2011-09-03 DIAGNOSIS — K219 Gastro-esophageal reflux disease without esophagitis: Secondary | ICD-10-CM

## 2011-09-03 DIAGNOSIS — R5381 Other malaise: Secondary | ICD-10-CM

## 2011-09-03 DIAGNOSIS — E538 Deficiency of other specified B group vitamins: Secondary | ICD-10-CM

## 2011-09-03 DIAGNOSIS — Z139 Encounter for screening, unspecified: Secondary | ICD-10-CM

## 2011-09-03 DIAGNOSIS — E785 Hyperlipidemia, unspecified: Secondary | ICD-10-CM

## 2011-09-03 DIAGNOSIS — D649 Anemia, unspecified: Secondary | ICD-10-CM

## 2011-09-03 DIAGNOSIS — J309 Allergic rhinitis, unspecified: Secondary | ICD-10-CM

## 2011-09-03 MED ORDER — SERTRALINE HCL 25 MG PO TABS: 25.0000 mg | ORAL_TABLET | Freq: Every day | ORAL | Status: DC

## 2011-09-03 MED ORDER — RABEPRAZOLE SODIUM 20 MG PO TBEC: DELAYED_RELEASE_TABLET | ORAL | Status: DC

## 2011-09-03 MED ORDER — CLONIDINE HCL 0.1 MG PO TABS: 0.1000 mg | ORAL_TABLET | Freq: Every day | ORAL | Status: DC

## 2011-09-03 MED ORDER — HYDROCHLOROTHIAZIDE 25 MG PO TABS: 25.0000 mg | ORAL_TABLET | Freq: Every day | ORAL | Status: DC

## 2011-09-03 MED ORDER — BENAZEPRIL HCL 20 MG PO TABS: 20.0000 mg | ORAL_TABLET | Freq: Every day | ORAL | Status: DC

## 2011-09-03 MED ORDER — CETIRIZINE HCL 10 MG PO TABS: 10.0000 mg | ORAL_TABLET | Freq: Every day | ORAL | Status: DC

## 2011-09-03 MED ORDER — POTASSIUM CHLORIDE CRYS ER 20 MEQ PO TBCR: 20.0000 meq | EXTENDED_RELEASE_TABLET | Freq: Two times a day (BID) | ORAL | Status: DC

## 2011-09-03 NOTE — Assessment & Plan Note (Signed)
Increased symptoms as expected, and uncontrolled, start daily med

## 2011-09-03 NOTE — Assessment & Plan Note (Signed)
Controlled, no change in medication  

## 2011-09-03 NOTE — Patient Instructions (Addendum)
F/u in 4 month  Fasting labs past due.  HBA1C in 4 month   Seven ciggaretes per day in March, 6 in April, 5 in May and 4 in June, you need to quit   Daily [place] jelly is good.  Med sent to pharmacy for allergies  It is important that you exercise regularly at least 30 minutes 5 times a week. If you develop chest pain, have severe difficulty breathing, or feel very tired, stop exercising immediately and seek medical attention   A healthy diet is rich in fruit, vegetables and whole grains. Poultry fish, nuts and beans are a healthy choice for protein rather then red meat. A low sodium diet and drinking 64 ounces of water daily is generally recommended. Oils and sweet should be limited. Carbohydrates especially for those who are diabetic or overweight, should be limited to 34-45 gram per meal. It is important to eat on a regular schedule, at least 3 times daily. Snacks should be primarily fruits, vegetables or nuts.

## 2011-09-03 NOTE — Assessment & Plan Note (Signed)
counseled to quit, pt still struggling with a definite commitment, has agree on a taper

## 2011-09-03 NOTE — Assessment & Plan Note (Signed)
Hyperlipidemia:Low fat diet discussed and encouraged.  \lab needed

## 2011-09-03 NOTE — Progress Notes (Signed)
  Subjective:    Patient ID: Jaime Murray, female    DOB: 05-14-1956, 56 y.o.   MRN: [id number]  HPI The PT is here for follow up and re-evaluation of chronic medical conditions, medication management and review of any available recent lab and radiology data.  Preventive health is updated, specifically  Cancer screening and Immunization.   Questions or concerns regarding consultations or procedures which the PT has had in the interim are  addressed. The PT denies any adverse reactions to current medications since the last visit.  There are no new concerns.  There are no specific complaints  Still smokes and struggling with making a decision to quit still. States fasting sugars are seldom over 120, denies polyuria, polydipsia or blurred vision C/o vaginal dryness, advised ok to use gel like ky jelly even without sexual activity      Review of Systems See HPI Denies recent fever or chills. Denies sinus pressure, nasal congestion, ear pain or sore throat.c/o increased allergy symptoms x 2 weeks, clear nasal drainage with post nasal tickle Denies chest congestion, productive cough or wheezing. Denies chest pains, palpitations and leg swelling Denies abdominal pain, nausea, vomiting,diarrhea or constipation.   Denies dysuria, frequency, hesitancy or incontinence. Denies joint pain, swelling and limitation in mobility. Denies headaches, seizures, numbness, or tingling. Denies depression, anxiety or insomnia. Denies skin break down or rash.        Objective:   Physical Exam Patient alert and oriented and in no cardiopulmonary distress.  HEENT: No facial asymmetry, EOMI, no sinus tenderness,  oropharynx pink and moist.  Neck supple no adenopathy.mild erythema and edema of nasal mucosa  Chest: Clear to auscultation bilaterally.  CVS: S1, S2 no murmurs, no S3.  ABD: Soft non tender. Bowel sounds normal.  Ext: No edema  MS: Adequate ROM spine, shoulders, hips and  knees.  Skin: Intact, no ulcerations or rash noted.  Psych: Good eye contact, normal affect. Memory intact not anxious or depressed appearing.  CNS: CN 2-12 intact, power, tone and sensation normal throughout.        Assessment & Plan:

## 2011-09-03 NOTE — Assessment & Plan Note (Signed)
Updated lab needed.  

## 2011-09-05 ENCOUNTER — Telehealth: Payer: Self-pay | Admitting: Family Medicine

## 2011-09-05 ENCOUNTER — Other Ambulatory Visit: Payer: Self-pay | Admitting: Family Medicine

## 2011-09-05 MED ORDER — AZITHROMYCIN 250 MG PO TABS: ORAL_TABLET | ORAL | Status: AC

## 2011-09-05 NOTE — Telephone Encounter (Signed)
z pack sent to [facility], pls let her know

## 2011-09-05 NOTE — Telephone Encounter (Signed)
Pt aware.

## 2011-09-09 ENCOUNTER — Other Ambulatory Visit: Payer: Self-pay | Admitting: Family Medicine

## 2011-09-09 LAB — COMPLETE METABOLIC PANEL WITH GFR
Albumin: 3.9 g/dL (ref 3.5–5.2)
CO2: 24 mEq/L (ref 19–32)
Calcium: 9.1 mg/dL (ref 8.4–10.5)
Chloride: 104 mEq/L (ref 96–112)
GFR, Est African American: >89 mL/min
GFR, Est Non African American: >89 mL/min
Glucose, Bld: 122 mg/dL — ABNORMAL HIGH (ref 70–99)
Potassium: 4.1 mEq/L (ref 3.5–5.3)
Sodium: 141 mEq/L (ref 135–145)
Total Protein: 6.9 g/dL (ref 6.0–8.3)

## 2011-09-09 LAB — LIPID PANEL
HDL: 37 mg/dL — ABNORMAL LOW (ref >39)
LDL Cholesterol: 111 mg/dL — ABNORMAL HIGH (ref 0–99)
Total CHOL/HDL Ratio: 4.6 Ratio
VLDL: 23 mg/dL (ref 0–40)

## 2011-09-09 LAB — CBC WITH DIFFERENTIAL/PLATELET
Basophils Absolute: 0 10*3/uL (ref 0.0–0.1)
Basophils Relative: 0 % (ref 0–1)
Eosinophils Absolute: 0.3 10*3/uL (ref 0.0–0.7)
Lymphs Abs: 3 10*3/uL (ref 0.7–4.0)
MCH: 25.6 pg — ABNORMAL LOW (ref 26.0–34.0)
Neutrophils Relative %: 50 % (ref 43–77)
Platelets: 312 10*3/uL (ref 150–400)
RBC: 4.84 MIL/uL (ref 3.87–5.11)

## 2011-09-09 LAB — VITAMIN B12: Vitamin B-12: 616 pg/mL (ref 211–911)

## 2011-09-09 LAB — IRON AND TIBC: UIBC: 350 ug/dL (ref 125–400)

## 2011-09-10 ENCOUNTER — Other Ambulatory Visit: Payer: Self-pay | Admitting: Family Medicine

## 2011-09-10 LAB — HEMOGLOBIN A1C
Hgb A1c MFr Bld: 6.4 % — ABNORMAL HIGH (ref <5.7)
Mean Plasma Glucose: 137 mg/dL — ABNORMAL HIGH (ref <117)

## 2011-09-12 LAB — HEPATITIS PANEL, ACUTE: Hepatitis B Surface Ag: NEGATIVE

## 2012-01-10 ENCOUNTER — Other Ambulatory Visit: Payer: Self-pay

## 2012-01-10 MED ORDER — SERTRALINE HCL 25 MG PO TABS: 25.0000 mg | ORAL_TABLET | Freq: Every day | ORAL | Status: DC

## 2012-01-10 MED ORDER — CLONIDINE HCL 0.1 MG PO TABS: 0.1000 mg | ORAL_TABLET | Freq: Every day | ORAL | Status: DC

## 2012-01-10 MED ORDER — RABEPRAZOLE SODIUM 20 MG PO TBEC: DELAYED_RELEASE_TABLET | ORAL | Status: DC

## 2012-01-10 MED ORDER — HYDROCHLOROTHIAZIDE 25 MG PO TABS: 25.0000 mg | ORAL_TABLET | Freq: Every day | ORAL | Status: DC

## 2012-01-10 MED ORDER — POTASSIUM CHLORIDE CRYS ER 20 MEQ PO TBCR: 20.0000 meq | EXTENDED_RELEASE_TABLET | Freq: Two times a day (BID) | ORAL | Status: DC

## 2012-01-10 MED ORDER — BENAZEPRIL HCL 20 MG PO TABS: 20.0000 mg | ORAL_TABLET | Freq: Every day | ORAL | Status: DC

## 2012-03-31 ENCOUNTER — Other Ambulatory Visit: Payer: Self-pay | Admitting: Family Medicine

## 2012-03-31 LAB — CBC WITH DIFFERENTIAL/PLATELET
Basophils Absolute: 0 10*3/uL (ref 0.0–0.1)
Basophils Relative: 0 % (ref 0–1)
Eosinophils Absolute: 0.3 10*3/uL (ref 0.0–0.7)
Hemoglobin: 12.2 g/dL (ref 12.0–15.0)
MCH: 26.9 pg (ref 26.0–34.0)
MCHC: 34.2 g/dL (ref 30.0–36.0)
Monocytes Relative: 5 % (ref 3–12)
Neutro Abs: 4.5 10*3/uL (ref 1.7–7.7)
Neutrophils Relative %: 58 % (ref 43–77)
Platelets: 284 10*3/uL (ref 150–400)

## 2012-04-01 LAB — COMPLETE METABOLIC PANEL WITH GFR
CO2: 25 mEq/L (ref 19–32)
Calcium: 9.2 mg/dL (ref 8.4–10.5)
Chloride: 105 mEq/L (ref 96–112)
Creat: 0.58 mg/dL (ref 0.50–1.10)
GFR, Est African American: >89 mL/min
GFR, Est Non African American: >89 mL/min
Glucose, Bld: 106 mg/dL — ABNORMAL HIGH (ref 70–99)
Sodium: 141 mEq/L (ref 135–145)
Total Bilirubin: 0.3 mg/dL (ref 0.3–1.2)
Total Protein: 6.5 g/dL (ref 6.0–8.3)

## 2012-04-01 LAB — LIPID PANEL
Cholesterol: 228 mg/dL — ABNORMAL HIGH (ref 0–200)
HDL: 43 mg/dL (ref >39)
Total CHOL/HDL Ratio: 5.3 Ratio
Triglycerides: 153 mg/dL — ABNORMAL HIGH (ref <150)

## 2012-04-06 ENCOUNTER — Telehealth: Payer: Self-pay | Admitting: Family Medicine

## 2012-04-09 ENCOUNTER — Ambulatory Visit (INDEPENDENT_AMBULATORY_CARE_PROVIDER_SITE_OTHER): Admitting: Family Medicine

## 2012-04-09 ENCOUNTER — Ambulatory Visit (HOSPITAL_COMMUNITY)
Admission: RE | Admit: 2012-04-09 | Discharge: 2012-04-09 | Disposition: A | Discharge status: Home / Self Care | Source: Ambulatory Visit | Attending: Family Medicine | Admitting: Family Medicine

## 2012-04-09 ENCOUNTER — Encounter: Payer: Self-pay | Admitting: Family Medicine

## 2012-04-09 VITALS — BP 116/70 | HR 80 | Resp 18 | Ht 62.0 in | Wt 146.1 lb

## 2012-04-09 DIAGNOSIS — M67439 Ganglion, unspecified wrist: Secondary | ICD-10-CM

## 2012-04-09 DIAGNOSIS — M549 Dorsalgia, unspecified: Secondary | ICD-10-CM

## 2012-04-09 DIAGNOSIS — M545 Low back pain, unspecified: Secondary | ICD-10-CM | POA: Insufficient documentation

## 2012-04-09 DIAGNOSIS — F17200 Nicotine dependence, unspecified, uncomplicated: Secondary | ICD-10-CM

## 2012-04-09 DIAGNOSIS — I10 Essential (primary) hypertension: Secondary | ICD-10-CM

## 2012-04-09 DIAGNOSIS — M6740 Ganglion, unspecified site: Secondary | ICD-10-CM

## 2012-04-09 DIAGNOSIS — M5090 Cervical disc disorder, unspecified, unspecified cervical region: Secondary | ICD-10-CM

## 2012-04-09 DIAGNOSIS — E785 Hyperlipidemia, unspecified: Secondary | ICD-10-CM

## 2012-04-09 DIAGNOSIS — E119 Type 2 diabetes mellitus without complications: Secondary | ICD-10-CM

## 2012-04-09 DIAGNOSIS — Z23 Encounter for immunization: Secondary | ICD-10-CM

## 2012-04-09 MED ORDER — RABEPRAZOLE SODIUM 20 MG PO TBEC: DELAYED_RELEASE_TABLET | ORAL | Status: DC

## 2012-04-09 MED ORDER — CLONIDINE HCL 0.1 MG PO TABS: 0.1000 mg | ORAL_TABLET | Freq: Every day | ORAL | Status: DC

## 2012-04-09 MED ORDER — ROSUVASTATIN CALCIUM 20 MG PO TABS: 20.0000 mg | ORAL_TABLET | Freq: Every day | ORAL | Status: DC

## 2012-04-09 MED ORDER — METHYLPREDNISOLONE ACETATE 80 MG/ML IJ SUSP
80.0000 mg | Freq: Once | INTRAMUSCULAR | Status: AC
  Administered 2012-04-09: 80 mg via INTRAMUSCULAR

## 2012-04-09 MED ORDER — BENAZEPRIL HCL 20 MG PO TABS: 20.0000 mg | ORAL_TABLET | Freq: Every day | ORAL | Status: DC

## 2012-04-09 MED ORDER — HYDROCODONE-ACETAMINOPHEN 5-500 MG PO TABS: ORAL_TABLET | ORAL | Status: DC

## 2012-04-09 MED ORDER — KETOROLAC TROMETHAMINE 60 MG/2ML IJ SOLN
60.0000 mg | Freq: Once | INTRAMUSCULAR | Status: AC
  Administered 2012-04-09: 60 mg via INTRAMUSCULAR

## 2012-04-09 MED ORDER — SERTRALINE HCL 25 MG PO TABS: 25.0000 mg | ORAL_TABLET | Freq: Every day | ORAL | Status: DC

## 2012-04-09 MED ORDER — PREDNISONE (PAK) 5 MG PO TABS: 5.0000 mg | ORAL_TABLET | ORAL | Status: DC

## 2012-04-09 MED ORDER — POTASSIUM CHLORIDE CRYS ER 20 MEQ PO TBCR: 20.0000 meq | EXTENDED_RELEASE_TABLET | Freq: Two times a day (BID) | ORAL | Status: DC

## 2012-04-09 MED ORDER — METFORMIN HCL 500 MG PO TABS: 500.0000 mg | ORAL_TABLET | Freq: Every day | ORAL | Status: DC

## 2012-04-09 MED ORDER — NITROGLYCERIN 0.4 MG SL SUBL: 0.4000 mg | SUBLINGUAL_TABLET | SUBLINGUAL | Status: DC | PRN

## 2012-04-09 MED ORDER — HYDROCHLOROTHIAZIDE 25 MG PO TABS: 25.0000 mg | ORAL_TABLET | Freq: Every day | ORAL | Status: DC

## 2012-04-09 NOTE — Telephone Encounter (Signed)
All meds sent in during office visit

## 2012-04-09 NOTE — Progress Notes (Signed)
  Subjective:    Patient ID: Jaime Murray, female    DOB: 1955-12-11, 56 y.o.   MRN: [id number]  HPI  The PT is here for follow up and re-evaluation of chronic medical conditions, medication management and review of any available recent lab and radiology data.  Preventive health is updated, specifically  Cancer screening and Immunization.   Questions or concerns regarding consultations or procedures which the PT has had in the interim are  addressed. The PT denies any adverse reactions to current medications since the last visit.  2 month h/o painful swelling at wrist increasing in size C/o  Generalized  Uncontrolled joint pain worse in neck and back , keeps up pt at night for the past 3 month Pain  In neck radiates to right hand keep pt awake, and low back pain non radiaiting  Blood sugar fasting generally less than 120       Review of Systems See HPI Denies recent fever or chills. Denies sinus pressure, nasal congestion, ear pain or sore throat. Denies chest congestion, productive cough or wheezing. Denies chest pains, palpitations and leg swelling Denies abdominal pain, nausea, vomiting,diarrhea or constipation.   Denies dysuria, frequency, hesitancy or incontinence.  Denies headaches, seizures, numbness, or tingling. Denies depression, anxiety or insomnia. Denies skin break down or rash.        Objective:   Physical Exam  Patient alert and oriented and in no cardiopulmonary distress.  HEENT: No facial asymmetry, EOMI, no sinus tenderness,  oropharynx pink and moist.  Neck decreased ROM with spasm no adenopathy.  Chest: Clear to auscultation bilaterally.  CVS: S1, S2 no murmurs, no S3.  ABD: Soft non tender. Bowel sounds normal.  Ext: No edema  MS: Adequate ROM spine,tender over lumbar spine,adequate in  shoulders, hips and knees.ganglion cyst on wrist  Skin: Intact, no ulcerations or rash noted.  Psych: Good eye contact, normal affect. Memory intact not  anxious or depressed appearing.  CNS: CN 2-12 intact, power, tone and sensation normal throughout.  Diabetic Foot Check:  Appearance - no lesions, ulcers or calluses Skin - no unusual pallor or redness Sensation - grossly intact to light touch Monofilament testing -  Right - Great toe, medial, central, lateral ball and posterior foot intact Left - Great toe, medial, central, lateral ball and posterior foot intact Pulses Left - Dorsalis Pedis and Posterior Tibia normal Right - Dorsalis Pedis and Posterior Tibia normal      Assessment & Plan:

## 2012-04-09 NOTE — Patient Instructions (Addendum)
cPE in end December.  Fasting lipid, cmp and hBA1C in 3.5 month  Flu vaccine today  Flu vaccine and toradol and depo medrol in office today for neck and back pain and prednisone a dose pack and hydrocodone  to local pharmacy.  You are referred for physical therapy, also need x rays of neck and back today.  If no improvement call for sooner appointment  Continue blood pressure and diabetic medicine.  Start crestor for cholesterol and follow a low fat diet.   Please think about quitting smoking.  This is very important for your health.  Consider setting a quit date, then cutting back or switching brands to prepare to stop.  Also think of the money you will save every day by not smoking.  Quick Tips to Quit Smoking: Fix a date i.e. keep a date in mind from when you would not touch a tobacco product to smoke  Keep yourself busy and block your mind with work loads or reading books or watching movies in malls where smoking is not allowed  Vanish off the things which reminds you about smoking for example match box, or your favorite lighter, or the pipe you used for smoking, or your favorite jeans and shirt with which you used to enjoy smoking, or the club where you used to do smoking  Try to avoid certain people places and incidences where and with whom smoking is a common factor to add on  Praise yourself with some token gifts from the money you saved by stopping smoking  Anti Smoking teams are there to help you. Join their programs  Anti-smoking Gums are there in many medical shops. Try them to quit smoking   Side-effects of Smoking: Disease caused by smoking cigarettes are emphysema, bronchitis, heart failures  Premature death  Cancer is the major side effect of smoking  Heart attacks and strokes are the quick effects of smoking causing sudden death  Some smokers lives end up with limbs amputated  Breathing problem or fast breathing is another side effect of smoking  Due to more  intakes of smokes, carbon mono-oxide goes into your brain and other muscles of the body which leads to swelling of the veins and blockage to the air passage to lungs  Carbon monoxide blocks blood vessels which leads to blockage in the flow of blood to different major body organs like heart lungs and thus leads to attacks and deaths  During pregnancy smoking is very harmful and leads to premature birth of the infant, spontaneous abortions, low weight of the infant during birth  Fat depositions to narrow and blocked blood vessels causing heart attacks  In many cases cigarette smoking caused infertility in men

## 2012-04-10 ENCOUNTER — Telehealth: Payer: Self-pay | Admitting: Family Medicine

## 2012-04-10 NOTE — Telephone Encounter (Signed)
pls change referral to Dr Romeo Apple per pt request, cancel Dr Hilda Lias, let her know

## 2012-04-11 DIAGNOSIS — M67439 Ganglion, unspecified wrist: Secondary | ICD-10-CM | POA: Insufficient documentation

## 2012-04-11 NOTE — Assessment & Plan Note (Signed)
New uncontrolled, anti inflammatories and xray

## 2012-04-11 NOTE — Assessment & Plan Note (Signed)
Severe and disabling, keeps pt awake at niight. Anti inflammatories and PT

## 2012-04-11 NOTE — Assessment & Plan Note (Signed)
Symptomatic cyst of right , requests orhto evalwrist

## 2012-04-11 NOTE — Assessment & Plan Note (Signed)
Controlled, no change in medication DASH diet and commitment to daily physical activity for a minimum of 30 minutes discussed and encouraged, as a part of hypertension management. The importance of attaining a healthy weight is also discussed.  

## 2012-04-11 NOTE — Assessment & Plan Note (Signed)
Unchanged. Patient counseled for approximately 5 minutes regarding the health risks of ongoing nicotine use, specifically all types of cancer, heart disease, stroke and respiratory failure. The options available for help with cessation ,the behavioral changes to assist the process, and the option to either gradully reduce usage  Or abruptly stop.is also discussed. Pt is also encouraged to set specific goals in number of cigarettes used daily, as well as to set a quit date.  

## 2012-04-11 NOTE — Assessment & Plan Note (Signed)
Uncontrolled, change to crestor Hyperlipidemia:Low fat diet discussed and encouraged.   

## 2012-04-15 ENCOUNTER — Telehealth: Payer: Self-pay | Admitting: Family Medicine

## 2012-04-15 DIAGNOSIS — E785 Hyperlipidemia, unspecified: Secondary | ICD-10-CM

## 2012-04-15 MED ORDER — RABEPRAZOLE SODIUM 20 MG PO TBEC: DELAYED_RELEASE_TABLET | ORAL | Status: DC

## 2012-04-15 MED ORDER — CLONIDINE HCL 0.1 MG PO TABS: 0.1000 mg | ORAL_TABLET | Freq: Every day | ORAL | Status: DC

## 2012-04-15 MED ORDER — METFORMIN HCL 500 MG PO TABS: 500.0000 mg | ORAL_TABLET | Freq: Every day | ORAL | Status: DC

## 2012-04-15 MED ORDER — NITROGLYCERIN 0.4 MG SL SUBL: 0.4000 mg | SUBLINGUAL_TABLET | SUBLINGUAL | Status: DC | PRN

## 2012-04-15 MED ORDER — SERTRALINE HCL 25 MG PO TABS: 25.0000 mg | ORAL_TABLET | Freq: Every day | ORAL | Status: DC

## 2012-04-15 MED ORDER — HYDROCHLOROTHIAZIDE 25 MG PO TABS: 25.0000 mg | ORAL_TABLET | Freq: Every day | ORAL | Status: DC

## 2012-04-15 MED ORDER — POTASSIUM CHLORIDE CRYS ER 20 MEQ PO TBCR: 20.0000 meq | EXTENDED_RELEASE_TABLET | Freq: Two times a day (BID) | ORAL | Status: DC

## 2012-04-15 MED ORDER — ROSUVASTATIN CALCIUM 20 MG PO TABS: 20.0000 mg | ORAL_TABLET | Freq: Every day | ORAL | Status: DC

## 2012-04-15 MED ORDER — BENAZEPRIL HCL 20 MG PO TABS: 20.0000 mg | ORAL_TABLET | Freq: Every day | ORAL | Status: DC

## 2012-04-15 NOTE — Telephone Encounter (Signed)
i resent all the meds that were sent in on the 19th

## 2012-04-17 ENCOUNTER — Telehealth: Payer: Self-pay

## 2012-04-17 NOTE — Telephone Encounter (Signed)
States the new cholesterol med you started her on is $400. She wants to go back on what she was taking. Also she stopped the potassium and is getting it OTC since it is a lot cheaper that way.  [facility]

## 2012-04-19 ENCOUNTER — Other Ambulatory Visit: Payer: Self-pay | Admitting: Family Medicine

## 2012-04-19 NOTE — Telephone Encounter (Signed)
Lovastatin 40mg  entered historically, pls fax with note to supplier to d/c crestor and let pt know, thanks

## 2012-04-20 ENCOUNTER — Other Ambulatory Visit: Payer: Self-pay

## 2012-04-20 ENCOUNTER — Telehealth: Payer: Self-pay | Admitting: Family Medicine

## 2012-04-20 MED ORDER — LOVASTATIN 40 MG PO TABS: 40.0000 mg | ORAL_TABLET | Freq: Every day | ORAL | Status: DC

## 2012-04-20 NOTE — Telephone Encounter (Signed)
Med was sent with message to dc crestor

## 2012-04-21 NOTE — Telephone Encounter (Signed)
New med to replace aciphex sent in.

## 2012-05-14 ENCOUNTER — Encounter: Payer: Self-pay | Admitting: Orthopedic Surgery

## 2012-05-14 ENCOUNTER — Encounter (HOSPITAL_COMMUNITY): Payer: Self-pay

## 2012-05-14 ENCOUNTER — Ambulatory Visit (INDEPENDENT_AMBULATORY_CARE_PROVIDER_SITE_OTHER): Payer: PRIVATE HEALTH INSURANCE

## 2012-05-14 ENCOUNTER — Ambulatory Visit (INDEPENDENT_AMBULATORY_CARE_PROVIDER_SITE_OTHER): Payer: PRIVATE HEALTH INSURANCE | Admitting: Orthopedic Surgery

## 2012-05-14 VITALS — BP 110/62 | Ht 62.0 in | Wt 146.0 lb

## 2012-05-14 DIAGNOSIS — M25539 Pain in unspecified wrist: Secondary | ICD-10-CM

## 2012-05-14 DIAGNOSIS — M6740 Ganglion, unspecified site: Secondary | ICD-10-CM | POA: Insufficient documentation

## 2012-05-14 NOTE — Progress Notes (Signed)
Patient ID: Jaime Murray, female   DOB: 11-30-1955, 56 y.o.   MRN: [id number] Chief Complaint  Patient presents with  . Cyst    right hand cyst since March 2013, gradual onset, no known injury   The patient has spontaneous onset of a mass over the dorsum of her left hand/wrist which is really over the extensor compartment #1. It is causing some numbness and tingling. Significant pain initially pain has subsided somewhat but she would like this removed  She does complain of snoring heartburn some chest pain and occasional tingling of her hand and palm. She is otherwise no complaints  Past Medical History  Diagnosis Date  . GERD (gastroesophageal reflux disease)   . Constipation   . Fatigue   . IDA (iron deficiency anemia)   . DM (diabetes mellitus)   . HTN (hypertension)   . Hypercholesteremia     History reviewed. No pertinent past surgical history.  Current Outpatient Prescriptions on File Prior to Visit  Medication Sig Dispense Refill  . aspirin 81 MG tablet Take 81 mg by mouth daily.        . benazepril (LOTENSIN) 20 MG tablet Take 1 tablet (20 mg total) by mouth daily.  90 tablet  1  . cetirizine (ZYRTEC ALLERGY) 10 MG tablet Take 1 tablet (10 mg total) by mouth daily.  90 tablet  1  . cloNIDine (CATAPRES) 0.1 MG tablet Take 1 tablet (0.1 mg total) by mouth at bedtime.  90 tablet  1  . COD LIVER OIL PO Take 1 tablet by mouth daily.      . ferrous sulfate dried (SLOW FE) 160 (50 FE) MG TBCR Take 160 mg by mouth daily.        . hydrochlorothiazide (HYDRODIURIL) 25 MG tablet Take 1 tablet (25 mg total) by mouth daily.  90 tablet  1  . HYDROcodone-acetaminophen (VICODIN) 5-500 MG per tablet One tablet once at bedtime, as needed, for uncontroled neck pain  20 tablet  0  . lovastatin (MEVACOR) 40 MG tablet Take 1 tablet (40 mg total) by mouth daily.  90 tablet  3  . metFORMIN (GLUCOPHAGE) 500 MG tablet Take 1 tablet (500 mg total) by mouth daily with breakfast.  90 tablet  1  .  nitroGLYCERIN (NITROSTAT) 0.4 MG SL tablet Place 1 tablet (0.4 mg total) under the tongue every 5 (five) minutes as needed.  90 tablet  0  . potassium chloride SA (K-DUR,KLOR-CON) 20 MEQ tablet Take 1 tablet (20 mEq total) by mouth 2 (two) times daily.  180 tablet  1  . RABEprazole (ACIPHEX) 20 MG tablet Take 20 mg by mouth daily.  90 tablet  1  . sertraline (ZOLOFT) 25 MG tablet Take 1 tablet (25 mg total) by mouth daily.  90 tablet  1  . clotrimazole-betamethasone (LOTRISONE) cream Apply topically 2 (two) times daily.  30 g  1  . fish oil-omega-3 fatty acids 1000 MG capsule Take 2 g by mouth daily.        Marland Kitchen lovastatin (MEVACOR) 10 MG tablet Take 1 tablet (10 mg total) by mouth at bedtime.  90 tablet  0  . Multiple Vitamin (MULTIVITAMIN) capsule Take 1 capsule by mouth daily.        . predniSONE (STERAPRED UNI-PAK) 5 MG TABS Take 1 tablet (5 mg total) by mouth as directed.  21 tablet  0   Current Facility-Administered Medications on File Prior to Visit  Medication Dose Route Frequency Provider Last Rate Last  Dose  . Influenza (>/= 3 years) inactive virus vaccine (FLVIRIN/FLUZONE) injection SUSP 0.5 mL  0.5 mL Intramuscular Once Kerri Perches, MD        BP 110/62  Ht 5\' 2"  (1.575 m)  Wt 146 lb (66.225 kg)  BMI 26.70 kg/m2 Physical Exam  Nursing note and vitals reviewed. Constitutional: She is oriented to person, place, and time. She appears well-developed and well-nourished.  HENT:  Head: Normocephalic.  Neck: Neck supple.  Cardiovascular: Intact distal pulses.   Pulmonary/Chest: No respiratory distress.  Abdominal: She exhibits no distension.  Neurological: She is alert and oriented to person, place, and time. She has normal reflexes. She displays normal reflexes. She exhibits normal muscle tone. Coordination normal.  Skin: Skin is warm and dry. No rash noted. No erythema. No pallor.  Psychiatric: She has a normal mood and affect. Her behavior is normal. Judgment and thought  content normal.  Ortho Exam   On the dorsum of the left hand/wrist over the first extensor compartment of the thumb is a firm mobile mass which does not move with the tendon, it is tender to palpation. Wrist joint is stable range of motion the wrist is normal Finkelstein's maneuver is negative I do not elicit the tingling or numbness in the finger thumb or hand strength is normal.  X-ray negative except for soft tissue mass  Ganglion cyst of tendon sheath most likely  Recommend excision

## 2012-05-14 NOTE — Patient Instructions (Addendum)
20 SHARDELL BUSTAMANTE  05/14/2012   Your procedure is scheduled on:   05/22/2012  Report to [facility] at  900  AM.  Call this number if you have problems the morning of surgery: [phone]   Remember:   Do not eat food:After Midnight.  May have clear liquids:until Midnight .    Take these medicines the morning of surgery with A SIP OF WATER:  Aciphex,prednisone,zoloft,benazepril,zyrtec,catapress,hydrodiuril,vicodin   Do not wear jewelry, make-up or nail polish.  Do not wear lotions, powders, or perfumes. You may wear deodorant.  Do not shave 48 hours prior to surgery. Men may shave face and neck.  Do not bring valuables to the hospital.  Contacts, dentures or bridgework may not be worn into surgery.  Leave suitcase in the car. After surgery it may be brought to your room.  For patients admitted to the hospital, checkout time is 11:00 AM the day of discharge.   Patients discharged the day of surgery will not be allowed to drive home.  Name and phone number of your driver: family  Special Instructions: Shower using CHG 2 nights before surgery and the night before surgery.  If you shower the day of surgery use CHG.  Use special wash - you have one bottle of CHG for all showers.  You should use approximately 1/3 of the bottle for each shower.   Please read over the following fact sheets that you were given: Pain Booklet, Coughing and Deep Breathing, MRSA Information, Surgical Site Infection Prevention, Anesthesia Post-op Instructions and Care and Recovery After Surgery Ganglion A ganglion is a swelling under the skin that is filled with a thick, jelly-like substance. It is a synovial cyst. This is caused by a break (rupture) of the joint lining from the joint space. A ganglion often occurs near an area of repeated minor trauma (damage caused by an accident). Trauma may also be a repetitive movement at work or in a sport. TREATMENT  It often goes away without treatment. It may reappear  later. Sometimes a ganglion may need to be surgically removed. Often they are drained and injected with a steroid. Sometimes they respond to:  Rest.  Splinting. HOME CARE INSTRUCTIONS   Your caregiver will decide the best way of treating your ganglion. Do not try to break the ganglion yourself by pressing on it, poking it with a needle, or hitting it with a heavy object.  Use medications as directed. SEEK MEDICAL CARE IF:   The ganglion becomes larger or more painful.  You have increased redness or swelling.  You have weakness or numbness in your hand or wrist. MAKE SURE YOU:   Understand these instructions.  Will watch your condition.  Will get help right away if you are not doing well or get worse. Document Released: 07/05/2000 Document Revised: 09/30/2011 Document Reviewed: 09/01/2007 [facility] Patient Information 2013 [place], [place]. PATIENT INSTRUCTIONS POST-ANESTHESIA  IMMEDIATELY FOLLOWING SURGERY:  Do not drive or operate machinery for the first twenty four hours after surgery.  Do not make any important decisions for twenty four hours after surgery or while taking narcotic pain medications or sedatives.  If you develop intractable nausea and vomiting or a severe headache please notify your doctor immediately.  FOLLOW-UP:  Please make an appointment with your surgeon as instructed. You do not need to follow up with anesthesia unless specifically instructed to do so.  WOUND CARE INSTRUCTIONS (if applicable):  Keep a dry clean dressing on the anesthesia/puncture wound site  if there is drainage.  Once the wound has quit draining you may leave it open to air.  Generally you should leave the bandage intact for twenty four hours unless there is drainage.  If the epidural site drains for more than 36-48 hours please call the anesthesia department.  QUESTIONS?:  Please feel free to call your physician or the hospital operator if you have any questions, and they will be happy to  assist you.

## 2012-05-14 NOTE — H&P (Signed)
Patient ID: Jaime Murray, female   DOB: 10-02-1955, 56 y.o.   MRN: [id number] Chief Complaint  Patient presents with  . Cyst    right hand cyst since March 2013, gradual onset, no known injury   The patient has spontaneous onset of a mass over the dorsum of her left hand/wrist which is really over the extensor compartment #1. It is causing some numbness and tingling. Significant pain initially pain has subsided somewhat but she would like this removed  She does complain of snoring heartburn some chest pain and occasional tingling of her hand and palm. She is otherwise no complaints  Past Medical History  Diagnosis Date  . GERD (gastroesophageal reflux disease)   . Constipation   . Fatigue   . IDA (iron deficiency anemia)   . DM (diabetes mellitus)   . HTN (hypertension)   . Hypercholesteremia     History reviewed. No pertinent past surgical history.  Current Outpatient Prescriptions on File Prior to Visit  Medication Sig Dispense Refill  . aspirin 81 MG tablet Take 81 mg by mouth daily.        . benazepril (LOTENSIN) 20 MG tablet Take 1 tablet (20 mg total) by mouth daily.  90 tablet  1  . cetirizine (ZYRTEC ALLERGY) 10 MG tablet Take 1 tablet (10 mg total) by mouth daily.  90 tablet  1  . cloNIDine (CATAPRES) 0.1 MG tablet Take 1 tablet (0.1 mg total) by mouth at bedtime.  90 tablet  1  . COD LIVER OIL PO Take 1 tablet by mouth daily.      . ferrous sulfate dried (SLOW FE) 160 (50 FE) MG TBCR Take 160 mg by mouth daily.        . hydrochlorothiazide (HYDRODIURIL) 25 MG tablet Take 1 tablet (25 mg total) by mouth daily.  90 tablet  1  . HYDROcodone-acetaminophen (VICODIN) 5-500 MG per tablet One tablet once at bedtime, as needed, for uncontroled neck pain  20 tablet  0  . lovastatin (MEVACOR) 40 MG tablet Take 1 tablet (40 mg total) by mouth daily.  90 tablet  3  . metFORMIN (GLUCOPHAGE) 500 MG tablet Take 1 tablet (500 mg total) by mouth daily with breakfast.  90 tablet  1  .  nitroGLYCERIN (NITROSTAT) 0.4 MG SL tablet Place 1 tablet (0.4 mg total) under the tongue every 5 (five) minutes as needed.  90 tablet  0  . potassium chloride SA (K-DUR,KLOR-CON) 20 MEQ tablet Take 1 tablet (20 mEq total) by mouth 2 (two) times daily.  180 tablet  1  . RABEprazole (ACIPHEX) 20 MG tablet Take 20 mg by mouth daily.  90 tablet  1  . sertraline (ZOLOFT) 25 MG tablet Take 1 tablet (25 mg total) by mouth daily.  90 tablet  1  . clotrimazole-betamethasone (LOTRISONE) cream Apply topically 2 (two) times daily.  30 g  1  . fish oil-omega-3 fatty acids 1000 MG capsule Take 2 g by mouth daily.        Marland Kitchen lovastatin (MEVACOR) 10 MG tablet Take 1 tablet (10 mg total) by mouth at bedtime.  90 tablet  0  . Multiple Vitamin (MULTIVITAMIN) capsule Take 1 capsule by mouth daily.        . predniSONE (STERAPRED UNI-PAK) 5 MG TABS Take 1 tablet (5 mg total) by mouth as directed.  21 tablet  0   Current Facility-Administered Medications on File Prior to Visit  Medication Dose Route Frequency Provider Last Rate Last  Dose  . Influenza (>/= 3 years) inactive virus vaccine (FLVIRIN/FLUZONE) injection SUSP 0.5 mL  0.5 mL Intramuscular Once Kerri Perches, MD        BP 110/62  Ht 5\' 2"  (1.575 m)  Wt 146 lb (66.225 kg)  BMI 26.70 kg/m2 Physical Exam  Nursing note and vitals reviewed. Constitutional: She is oriented to person, place, and time. She appears well-developed and well-nourished.  HENT:  Head: Normocephalic.  Neck: Neck supple.  Cardiovascular: Intact distal pulses.   Pulmonary/Chest: No respiratory distress.  Abdominal: She exhibits no distension.  Neurological: She is alert and oriented to person, place, and time. She has normal reflexes. She displays normal reflexes. She exhibits normal muscle tone. Coordination normal.  Skin: Skin is warm and dry. No rash noted. No erythema. No pallor.  Psychiatric: She has a normal mood and affect. Her behavior is normal. Judgment and thought  content normal.  Ortho Exam   On the dorsum of the left hand/wrist over the first extensor compartment of the thumb is a firm mobile mass which does not move with the tendon, it is tender to palpation. Wrist joint is stable range of motion the wrist is normal Finkelstein's maneuver is negative I do not elicit the tingling or numbness in the finger thumb or hand strength is normal.  X-ray negative except for soft tissue mass  Ganglion cyst of tendon sheath most likely  Recommend excision ganglion cyst/mass left hand/wrist

## 2012-05-14 NOTE — Patient Instructions (Addendum)
Surgery   Cyst removal left wrist   This procedure has been fully reviewed with the patient risk of infection and recurrence   OOW Nov 1-5   Ganglion A ganglion is a swelling under the skin that is filled with a thick, jelly-like substance. It is a synovial cyst. This is caused by a break (rupture) of the joint lining from the joint space. A ganglion often occurs near an area of repeated minor trauma (damage caused by an accident). Trauma may also be a repetitive movement at work or in a sport. TREATMENT   It often goes away without treatment. It may reappear later. Sometimes a ganglion may need to be surgically removed. Often they are drained and injected with a steroid. Sometimes they respond to:  Rest.   Splinting.  HOME CARE INSTRUCTIONS    Your caregiver will decide the best way of treating your ganglion. Do not try to break the ganglion yourself by pressing on it, poking it with a needle, or hitting it with a heavy object.   Use medications as directed.  SEEK MEDICAL CARE IF:    The ganglion becomes larger or more painful.   You have increased redness or swelling.   You have weakness or numbness in your hand or wrist.  MAKE SURE YOU:    Understand these instructions.   Will watch your condition.   Will get help right away if you are not doing well or get worse.  Document Released: 07/05/2000 Document Revised: 09/30/2011 Document Reviewed: 09/01/2007 [facility] Patient Information 2013 [place], [place].

## 2012-05-15 ENCOUNTER — Encounter (HOSPITAL_COMMUNITY)
Admission: RE | Admit: 2012-05-15 | Discharge: 2012-05-15 | Disposition: A | Discharge status: Home / Self Care | Payer: Self-pay | Source: Ambulatory Visit | Attending: Orthopedic Surgery | Admitting: Orthopedic Surgery

## 2012-05-15 ENCOUNTER — Encounter (HOSPITAL_COMMUNITY): Payer: Self-pay

## 2012-05-15 ENCOUNTER — Other Ambulatory Visit: Payer: Self-pay

## 2012-05-15 HISTORY — DX: Depression, unspecified: F32.A

## 2012-05-15 HISTORY — DX: Anxiety disorder, unspecified: F41.9

## 2012-05-15 HISTORY — DX: Major depressive disorder, single episode, unspecified: F32.9

## 2012-05-15 LAB — BASIC METABOLIC PANEL
BUN: 7 mg/dL (ref 6–23)
CO2: 26 mEq/L (ref 19–32)
Chloride: 101 mEq/L (ref 96–112)
Creatinine, Ser: 0.57 mg/dL (ref 0.50–1.10)

## 2012-05-15 LAB — HEMOGLOBIN AND HEMATOCRIT, BLOOD: Hemoglobin: 11.9 g/dL — ABNORMAL LOW (ref 12.0–15.0)

## 2012-05-18 ENCOUNTER — Telehealth: Payer: Self-pay | Admitting: Orthopedic Surgery

## 2012-05-18 NOTE — Telephone Encounter (Signed)
RE:  Out-patient surgery scheduled 05/22/12 at [facility] -- CPT [phone], ICD9 [phone], 727.43 -   Contacted primary insurer [place], [place] [phone]; per Peggye Form, no pre-authorization required for out-patient procedure. Her name and today's date, 05/18/12, for reference.  Contacted secondary insurer [employer], per online pre-authorization tool, no pre-certification required as well, 05/18/12, 11:57a.m.Marland Kitchen

## 2012-05-22 ENCOUNTER — Encounter (HOSPITAL_COMMUNITY): Payer: Self-pay | Admitting: *Deleted

## 2012-05-22 ENCOUNTER — Encounter (HOSPITAL_COMMUNITY)
Admission: RE | Disposition: A | Discharge status: Home / Self Care | Payer: Self-pay | Source: Ambulatory Visit | Attending: Orthopedic Surgery

## 2012-05-22 ENCOUNTER — Ambulatory Visit (HOSPITAL_COMMUNITY): Payer: Self-pay | Admitting: Anesthesiology

## 2012-05-22 ENCOUNTER — Encounter (HOSPITAL_COMMUNITY): Payer: Self-pay | Admitting: Anesthesiology

## 2012-05-22 ENCOUNTER — Ambulatory Visit (HOSPITAL_COMMUNITY)
Admission: RE | Admit: 2012-05-22 | Discharge: 2012-05-22 | Disposition: A | Discharge status: Home / Self Care | Payer: PRIVATE HEALTH INSURANCE | Source: Ambulatory Visit | Attending: Orthopedic Surgery | Admitting: Orthopedic Surgery

## 2012-05-22 DIAGNOSIS — Z01810 Encounter for preprocedural cardiovascular examination: Secondary | ICD-10-CM | POA: Insufficient documentation

## 2012-05-22 DIAGNOSIS — Z01812 Encounter for preprocedural laboratory examination: Secondary | ICD-10-CM | POA: Insufficient documentation

## 2012-05-22 DIAGNOSIS — I10 Essential (primary) hypertension: Secondary | ICD-10-CM | POA: Insufficient documentation

## 2012-05-22 DIAGNOSIS — M6740 Ganglion, unspecified site: Secondary | ICD-10-CM | POA: Insufficient documentation

## 2012-05-22 HISTORY — PX: GANGLION CYST EXCISION: SHX1691

## 2012-05-22 LAB — GLUCOSE, CAPILLARY: Glucose-Capillary: 121 mg/dL — ABNORMAL HIGH (ref 70–99)

## 2012-05-22 SURGERY — EXCISION, GANGLION CYST, WRIST
Anesthesia: Regional | Site: Wrist | Laterality: Left | Wound class: Clean

## 2012-05-22 MED ORDER — BUPIVACAINE HCL (PF) 0.5 % IJ SOLN
INTRAMUSCULAR | Status: DC | PRN
  Administered 2012-05-22: 5 mL

## 2012-05-22 MED ORDER — MIDAZOLAM HCL 2 MG/2ML IJ SOLN
1.0000 mg | INTRAMUSCULAR | Status: DC | PRN
  Administered 2012-05-22: 2 mg via INTRAVENOUS

## 2012-05-22 MED ORDER — CEFAZOLIN SODIUM-DEXTROSE 2-3 GM-% IV SOLR
INTRAVENOUS | Status: DC | PRN
  Administered 2012-05-22: 2 g via INTRAVENOUS

## 2012-05-22 MED ORDER — FENTANYL CITRATE 0.05 MG/ML IJ SOLN: 25.0000 ug | INTRAMUSCULAR | Status: DC | PRN

## 2012-05-22 MED ORDER — LIDOCAINE HCL (PF) 0.5 % IJ SOLN
INTRAMUSCULAR | Status: AC
  Filled 2012-05-22: qty 50

## 2012-05-22 MED ORDER — ONDANSETRON HCL 4 MG/2ML IJ SOLN: 4.0000 mg | Freq: Once | INTRAMUSCULAR | Status: DC | PRN

## 2012-05-22 MED ORDER — MIDAZOLAM HCL 2 MG/2ML IJ SOLN
INTRAMUSCULAR | Status: AC
  Filled 2012-05-22: qty 2

## 2012-05-22 MED ORDER — HYDROCODONE-ACETAMINOPHEN 5-325 MG PO TABS: 1.0000 | ORAL_TABLET | ORAL | Status: DC | PRN

## 2012-05-22 MED ORDER — PROPOFOL INFUSION 10 MG/ML OPTIME
INTRAVENOUS | Status: DC | PRN
  Administered 2012-05-22: 75 ug/kg/min via INTRAVENOUS

## 2012-05-22 MED ORDER — CHLORHEXIDINE GLUCONATE 4 % EX LIQD: 60.0000 mL | Freq: Once | CUTANEOUS | Status: DC

## 2012-05-22 MED ORDER — LACTATED RINGERS IV SOLN
INTRAVENOUS | Status: DC
  Administered 2012-05-22: 1000 mL via INTRAVENOUS

## 2012-05-22 MED ORDER — BUPIVACAINE HCL (PF) 0.5 % IJ SOLN
INTRAMUSCULAR | Status: AC
  Filled 2012-05-22: qty 30

## 2012-05-22 MED ORDER — SODIUM CHLORIDE 0.9 % IR SOLN
Status: DC | PRN
  Administered 2012-05-22: 1000 mL

## 2012-05-22 MED ORDER — LIDOCAINE HCL (PF) 1 % IJ SOLN
INTRAMUSCULAR | Status: AC
  Filled 2012-05-22: qty 5

## 2012-05-22 MED ORDER — FENTANYL CITRATE 0.05 MG/ML IJ SOLN
INTRAMUSCULAR | Status: AC
  Filled 2012-05-22: qty 2

## 2012-05-22 MED ORDER — LIDOCAINE HCL (CARDIAC) 10 MG/ML IV SOLN
INTRAVENOUS | Status: DC | PRN
  Administered 2012-05-22: 50 mg via INTRAVENOUS

## 2012-05-22 MED ORDER — LIDOCAINE HCL (PF) 0.5 % IJ SOLN
INTRAMUSCULAR | Status: DC | PRN
  Administered 2012-05-22: 50 mL

## 2012-05-22 MED ORDER — PROPOFOL 10 MG/ML IV EMUL
INTRAVENOUS | Status: AC
  Filled 2012-05-22: qty 20

## 2012-05-22 MED ORDER — CEFAZOLIN SODIUM-DEXTROSE 2-3 GM-% IV SOLR
INTRAVENOUS | Status: AC
  Filled 2012-05-22: qty 50

## 2012-05-22 MED ORDER — CEFAZOLIN SODIUM-DEXTROSE 2-3 GM-% IV SOLR: 2.0000 g | INTRAVENOUS | Status: DC

## 2012-05-22 MED ORDER — FENTANYL CITRATE 0.05 MG/ML IJ SOLN
INTRAMUSCULAR | Status: DC | PRN
  Administered 2012-05-22 (×2): 50 ug via INTRAVENOUS

## 2012-05-22 MED ORDER — LACTATED RINGERS IV SOLN
INTRAVENOUS | Status: DC | PRN
  Administered 2012-05-22: 07:00:00 via INTRAVENOUS

## 2012-05-22 SURGICAL SUPPLY — 45 items
BAG HAMPER (MISCELLANEOUS) ×2 IMPLANT
BANDAGE ELASTIC 3 VELCRO NS (GAUZE/BANDAGES/DRESSINGS) ×2 IMPLANT
BANDAGE ELASTIC 3 VELCRO ST LF (GAUZE/BANDAGES/DRESSINGS) IMPLANT
BANDAGE ESMARK 4X12 BL STRL LF (DISPOSABLE) ×1 IMPLANT
BANDAGE GAUZE ELAST BULKY 4 IN (GAUZE/BANDAGES/DRESSINGS) IMPLANT
BLADE SURG 15 STRL LF DISP TIS (BLADE) ×1 IMPLANT
BLADE SURG 15 STRL SS (BLADE) ×1
BNDG COHESIVE 4X5 TAN NS LF (GAUZE/BANDAGES/DRESSINGS) IMPLANT
BNDG ESMARK 4X12 BLUE STRL LF (DISPOSABLE) ×2
CHLORAPREP W/TINT 26ML (MISCELLANEOUS) ×2 IMPLANT
CLOTH BEACON ORANGE TIMEOUT ST (SAFETY) ×2 IMPLANT
COVER LIGHT HANDLE STERIS (MISCELLANEOUS) ×4 IMPLANT
CUFF TOURNIQUET SINGLE 18IN (TOURNIQUET CUFF) ×2 IMPLANT
DECANTER SPIKE VIAL GLASS SM (MISCELLANEOUS) ×2 IMPLANT
DERMABOND ADVANCED (GAUZE/BANDAGES/DRESSINGS) ×1
DERMABOND ADVANCED .7 DNX12 (GAUZE/BANDAGES/DRESSINGS) ×1 IMPLANT
DRSG XEROFORM 1X8 (GAUZE/BANDAGES/DRESSINGS) IMPLANT
ELECT NEEDLE TIP 2.8 STRL (NEEDLE) ×2 IMPLANT
ELECT REM PT RETURN 9FT ADLT (ELECTROSURGICAL) ×2
ELECTRODE REM PT RTRN 9FT ADLT (ELECTROSURGICAL) ×1 IMPLANT
FORMALIN 10 PREFIL 120ML (MISCELLANEOUS) ×2 IMPLANT
GLOVE BIOGEL PI IND STRL 7.0 (GLOVE) ×1 IMPLANT
GLOVE BIOGEL PI INDICATOR 7.0 (GLOVE) ×1
GLOVE SKINSENSE NS SZ8.0 LF (GLOVE) ×1
GLOVE SKINSENSE STRL SZ8.0 LF (GLOVE) ×1 IMPLANT
GLOVE SS BIOGEL STRL SZ 6.5 (GLOVE) ×1 IMPLANT
GLOVE SS N UNI LF 8.5 STRL (GLOVE) ×2 IMPLANT
GLOVE SUPERSENSE BIOGEL SZ 6.5 (GLOVE) ×1
GOWN STRL REIN XL XLG (GOWN DISPOSABLE) ×6 IMPLANT
HAND ALUMI LG (SOFTGOODS) IMPLANT
HAND ALUMI XLG (SOFTGOODS) ×2 IMPLANT
KIT ROOM TURNOVER APOR (KITS) ×2 IMPLANT
MANIFOLD NEPTUNE II (INSTRUMENTS) ×2 IMPLANT
NEEDLE HYPO 21X1.5 SAFETY (NEEDLE) ×2 IMPLANT
NS IRRIG 1000ML POUR BTL (IV SOLUTION) ×2 IMPLANT
PACK BASIC LIMB (CUSTOM PROCEDURE TRAY) ×2 IMPLANT
PAD ARMBOARD 7.5X6 YLW CONV (MISCELLANEOUS) ×2 IMPLANT
SET BASIN LINEN APH (SET/KITS/TRAYS/PACK) ×2 IMPLANT
SPONGE GAUZE 4X4 12PLY (GAUZE/BANDAGES/DRESSINGS) ×2 IMPLANT
STRIP CLOSURE SKIN 1/2X4 (GAUZE/BANDAGES/DRESSINGS) ×2 IMPLANT
SUT ETHILON 3 0 FSL (SUTURE) ×2 IMPLANT
SUT MON AB 2-0 SH 27 (SUTURE) ×1
SUT MON AB 2-0 SH27 (SUTURE) ×1 IMPLANT
SUT PROLENE 3 0 PS 2 (SUTURE) IMPLANT
SYR CONTROL 10ML LL (SYRINGE) ×2 IMPLANT

## 2012-05-22 NOTE — Brief Op Note (Signed)
05/22/2012  7:56 AM  PATIENT:  Jaime Murray  56 y.o. female  PRE-OPERATIVE DIAGNOSIS:  ganglion cyst left wrist  POST-OPERATIVE DIAGNOSIS:  ganglion cyst of tendon sheath left wrist  PROCEDURE:  Procedure(s) (LRB) with comments: REMOVAL GANGLION OF WRIST (Left)  SURGEON:  Surgeon(s) and Role:    * Vickki Hearing, MD - Primary  PHYSICIAN ASSISTANT:   ASSISTANTS: none   ANESTHESIA:   regional  EBL:  Total I/O In: 300 [I.V.:300] Out: -   BLOOD ADMINISTERED:none  DRAINS: none   LOCAL MEDICATIONS USED:  MARCAINE    and Amount: 5 ml  SPECIMEN:  Ganglion cyst of tendon sheath 1st extensor compartment   DISPOSITION OF SPECIMEN:  PATHOLOGY  COUNTS:  YES  TOURNIQUET:  * Missing tourniquet times found for documented tourniquets in log:  16109 *  DICTATION: .Dragon Dictation  PLAN OF CARE: Discharge to home after PACU  PATIENT DISPOSITION:  PACU - hemodynamically stable.   Delay start of Pharmacological VTE agent (>24hrs) due to surgical blood loss or risk of bleeding: not applicable

## 2012-05-22 NOTE — Preoperative (Signed)
Beta Blockers   Reason not to administer Beta Blockers:Not Applicable 

## 2012-05-22 NOTE — Anesthesia Postprocedure Evaluation (Signed)
  Anesthesia Post-op Note  Patient: Jaime Murray  Procedure(s) Performed: Procedure(s) (LRB) with comments: REMOVAL GANGLION OF WRIST (Left)  Patient Location: PACU  Anesthesia Type: MAC  Level of Consciousness: awake, alert , oriented and patient cooperative  Airway and Oxygen Therapy: Patient Spontanous Breathing and Patient connected to face mask oxygen  Post-op Pain: mild  Post-op Assessment: Post-op Vital signs reviewed, Patient's Cardiovascular Status Stable, Respiratory Function Stable, Patent Airway and No signs of Nausea or vomiting  Post-op Vital Signs: Reviewed and stable  Complications: No apparent anesthesia complications

## 2012-05-22 NOTE — Transfer of Care (Signed)
  Anesthesia Post-op Note  Patient: Jaime Murray  Procedure(s) Performed: Procedure(s) (LRB) with comments: REMOVAL GANGLION OF WRIST (Left)  Patient Location: PACU  Anesthesia Type: MAC  Level of Consciousness: awake, alert , oriented and patient cooperative  Airway and Oxygen Therapy: Patient Spontanous Breathing and Patient connected to face mask oxygen  Post-op Pain: mild  Post-op Assessment: Post-op Vital signs reviewed, Patient's Cardiovascular Status Stable, Respiratory Function Stable, Patent Airway and No signs of Nausea or vomiting  Post-op Vital Signs: Reviewed and stable  Complications: No apparent anesthesia complications  

## 2012-05-22 NOTE — H&P (Signed)
Patient ID: Jaime Murray, female   DOB: 12/01/1955, 56 y.o.   MRN: [id number] Chief Complaint  Patient presents with  . Cyst    right hand cyst since March 2013, gradual onset, no known injury   The patient has spontaneous onset of a mass over the dorsum of her left hand/wrist which is really over the extensor compartment #1. It is causing some numbness and tingling. Significant pain initially pain has subsided somewhat but she would like this removed  She does complain of snoring heartburn some chest pain and occasional tingling of her hand and palm. She is otherwise no complaints  Past Medical History  Diagnosis Date  . GERD (gastroesophageal reflux disease)   . Constipation   . Fatigue   . IDA (iron deficiency anemia)   . DM (diabetes mellitus)   . HTN (hypertension)   . Hypercholesteremia     History reviewed. No pertinent past surgical history.  Current Outpatient Prescriptions on File Prior to Visit  Medication Sig Dispense Refill  . aspirin 81 MG tablet Take 81 mg by mouth daily.        . benazepril (LOTENSIN) 20 MG tablet Take 1 tablet (20 mg total) by mouth daily.  90 tablet  1  . cetirizine (ZYRTEC ALLERGY) 10 MG tablet Take 1 tablet (10 mg total) by mouth daily.  90 tablet  1  . cloNIDine (CATAPRES) 0.1 MG tablet Take 1 tablet (0.1 mg total) by mouth at bedtime.  90 tablet  1  . COD LIVER OIL PO Take 1 tablet by mouth daily.      . ferrous sulfate dried (SLOW FE) 160 (50 FE) MG TBCR Take 160 mg by mouth daily.        . hydrochlorothiazide (HYDRODIURIL) 25 MG tablet Take 1 tablet (25 mg total) by mouth daily.  90 tablet  1  . HYDROcodone-acetaminophen (VICODIN) 5-500 MG per tablet One tablet once at bedtime, as needed, for uncontroled neck pain  20 tablet  0  . lovastatin (MEVACOR) 40 MG tablet Take 1 tablet (40 mg total) by mouth daily.  90 tablet  3  . metFORMIN (GLUCOPHAGE) 500 MG tablet Take 1 tablet (500 mg total) by mouth daily with breakfast.  90 tablet  1  .  nitroGLYCERIN (NITROSTAT) 0.4 MG SL tablet Place 1 tablet (0.4 mg total) under the tongue every 5 (five) minutes as needed.  90 tablet  0  . potassium chloride SA (K-DUR,KLOR-CON) 20 MEQ tablet Take 1 tablet (20 mEq total) by mouth 2 (two) times daily.  180 tablet  1  . RABEprazole (ACIPHEX) 20 MG tablet Take 20 mg by mouth daily.  90 tablet  1  . sertraline (ZOLOFT) 25 MG tablet Take 1 tablet (25 mg total) by mouth daily.  90 tablet  1  . clotrimazole-betamethasone (LOTRISONE) cream Apply topically 2 (two) times daily.  30 g  1  . fish oil-omega-3 fatty acids 1000 MG capsule Take 2 g by mouth daily.        . lovastatin (MEVACOR) 10 MG tablet Take 1 tablet (10 mg total) by mouth at bedtime.  90 tablet  0  . Multiple Vitamin (MULTIVITAMIN) capsule Take 1 capsule by mouth daily.        . predniSONE (STERAPRED UNI-PAK) 5 MG TABS Take 1 tablet (5 mg total) by mouth as directed.  21 tablet  0   Current Facility-Administered Medications on File Prior to Visit  Medication Dose Route Frequency Provider Last Rate Last   Dose  . Influenza (>/= 3 years) inactive virus vaccine (FLVIRIN/FLUZONE) injection SUSP 0.5 mL  0.5 mL Intramuscular Once Margaret E Simpson, MD        BP 110/62  Ht 5' 2" (1.575 m)  Wt 146 lb (66.225 kg)  BMI 26.70 kg/m2 Physical Exam  Nursing note and vitals reviewed. Constitutional: She is oriented to person, place, and time. She appears well-developed and well-nourished.  HENT:  Head: Normocephalic.  Neck: Neck supple.  Cardiovascular: Intact distal pulses.   Pulmonary/Chest: No respiratory distress.  Abdominal: She exhibits no distension.  Neurological: She is alert and oriented to person, place, and time. She has normal reflexes. She displays normal reflexes. She exhibits normal muscle tone. Coordination normal.  Skin: Skin is warm and dry. No rash noted. No erythema. No pallor.  Psychiatric: She has a normal mood and affect. Her behavior is normal. Judgment and thought  content normal.  Ortho Exam   On the dorsum of the left hand/wrist over the first extensor compartment of the thumb is a firm mobile mass which does not move with the tendon, it is tender to palpation. Wrist joint is stable range of motion the wrist is normal Finkelstein's maneuver is negative I do not elicit the tingling or numbness in the finger thumb or hand strength is normal.  X-ray negative except for soft tissue mass  Ganglion cyst of tendon sheath most likely  Recommend excision ganglion cyst/mass left hand/wrist   

## 2012-05-22 NOTE — Op Note (Signed)
05/22/2012  7:56 AM  PATIENT:  Jaime Murray  56 y.o. female  PRE-OPERATIVE DIAGNOSIS:  ganglion cyst left wrist  POST-OPERATIVE DIAGNOSIS:  ganglion cyst of tendon sheath left wrist  FINDINGS: SMALL CYST FROM 1ST EXTENSOR COMPARTMENT, FROM TENDON SHEATH   Procedure details: The patient was identified in the preop holding area and the left wrist was confirmed as a surgical site and the cyst was marked. Chart review was completed. Update was completed.  The patient was taken to the operating room for regional anesthesia. Sterile prep and drape. Timeout completed.  A transverse incision was made in Langer's lines. Blunt dissection was carried out around the cyst. The cyst appeared to be coming from the first extensor compartment tendon sheath. The tendons were protected in this cyst was excised. The wound was irrigated and closed with 2-0 Monocryl Dermabond and Steri-Strips. 2 half cc were injected proximal and distal to the wound. Tourniquet was released. Sterile dressing was applied prior to tourniquet release  Patient taken recovery room in stable condition PROCEDURE:  Procedure(s) (LRB) with comments: REMOVAL GANGLION OF WRIST (Left)  SURGEON:  Surgeon(s) and Role:    * Vickki Hearing, MD - Primary  PHYSICIAN ASSISTANT:   ASSISTANTS: none   ANESTHESIA:   regional  EBL:  Total I/O In: 300 [I.V.:300] Out: -   BLOOD ADMINISTERED:none  DRAINS: none   LOCAL MEDICATIONS USED:  MARCAINE    and Amount: 5 ml  SPECIMEN:  Ganglion cyst of tendon sheath 1st extensor compartment   DISPOSITION OF SPECIMEN:  PATHOLOGY  COUNTS:  YES  TOURNIQUET:  * Missing tourniquet times found for documented tourniquets in log:  16109 *  DICTATION: .Dragon Dictation  PLAN OF CARE: Discharge to home after PACU  PATIENT DISPOSITION:  PACU - hemodynamically stable.   Delay start of Pharmacological VTE agent (>24hrs) due to surgical blood loss or risk of bleeding: not applicable

## 2012-05-22 NOTE — Interval H&P Note (Signed)
History and Physical Interval Note:  05/22/2012 7:20 AM  Jaime Murray  has presented today for surgery, with the diagnosis of ganglion cyst left wrist  The various methods of treatment have been discussed with the patient and family. After consideration of risks, benefits and other options for treatment, the patient has consented to  Procedure(s) (LRB) with comments: REMOVAL GANGLION OF WRIST (Left) - wrist as a surgical intervention .  The patient's history has been reviewed, patient examined, no change in status, stable for surgery.  I have reviewed the patient's chart and labs.  Questions were answered to the patient's satisfaction.     Fuller Canada

## 2012-05-22 NOTE — Anesthesia Procedure Notes (Addendum)
Performed by: Carolyne Littles, Maekayla Giorgio L Oxygen Delivery Method: Simple face mask    Procedure Name: MAC Date/Time: 05/22/2012 7:25 AM Performed by: Carolyne Littles, Litsy Epting L Pre-anesthesia Checklist: Patient identified, Patient being monitored, Emergency Drugs available, Timeout performed and Suction available Patient Re-evaluated:Patient Re-evaluated prior to inductionOxygen Delivery Method: Nasal cannula    Anesthesia Regional Block:  Bier block (IV Regional)  Pre-Anesthetic Checklist: ,, timeout performed, Correct Patient, Correct Site, Correct Laterality, Correct Procedure,, site marked, surgical consent,, at surgeon's request  Laterality: Left     Needles:  Injection technique: Single-shot  Needle Type: Other      Needle Gauge: 22 and 22 G    Additional Needles: Bier block (IV Regional)  Nerve Stimulator or Paresthesia:   Additional Responses:  Pulse checked post tourniquet inflation. IV NSL discontinued post injection. Narrative:  End time: 05/22/2012 7:31 AM  Performed by: Personally   Additional Notes: Lidocaine .5%  50cc   Bier block (IV Regional)

## 2012-05-22 NOTE — Anesthesia Preprocedure Evaluation (Signed)
Anesthesia Evaluation  Patient identified by MRN, date of birth, ID band Patient awake    Reviewed: Allergy & Precautions, H&P , NPO status , Patient's Chart, lab work & pertinent test results  Airway Mallampati: I TM Distance: >3 FB     Dental  (+) Poor Dentition   Pulmonary neg pulmonary ROS, Current Smoker,  breath sounds clear to auscultation        Cardiovascular hypertension, Pt. on medications Rhythm:Regular Rate:Normal     Neuro/Psych PSYCHIATRIC DISORDERS Anxiety Depression    GI/Hepatic GERD-  Medicated and Controlled,  Endo/Other  diabetes, Well Controlled, Type 2, Oral Hypoglycemic Agents  Renal/GU      Musculoskeletal   Abdominal   Peds  Hematology   Anesthesia Other Findings   Reproductive/Obstetrics                           Anesthesia Physical Anesthesia Plan  ASA: II  Anesthesia Plan: Bier Block   Post-op Pain Management:    Induction: Intravenous  Airway Management Planned: Nasal Cannula  Additional Equipment:   Intra-op Plan:   Post-operative Plan:   Informed Consent: I have reviewed the patients History and Physical, chart, labs and discussed the procedure including the risks, benefits and alternatives for the proposed anesthesia with the patient or authorized representative who has indicated his/her understanding and acceptance.     Plan Discussed with:   Anesthesia Plan Comments:         Anesthesia Quick Evaluation

## 2012-05-26 ENCOUNTER — Encounter (HOSPITAL_COMMUNITY): Payer: Self-pay | Admitting: Orthopedic Surgery

## 2012-06-01 ENCOUNTER — Ambulatory Visit (INDEPENDENT_AMBULATORY_CARE_PROVIDER_SITE_OTHER): Payer: PRIVATE HEALTH INSURANCE | Admitting: Orthopedic Surgery

## 2012-06-01 ENCOUNTER — Encounter: Payer: Self-pay | Admitting: Orthopedic Surgery

## 2012-06-01 VITALS — Ht 62.0 in | Wt 146.0 lb

## 2012-06-01 DIAGNOSIS — M6740 Ganglion, unspecified site: Secondary | ICD-10-CM

## 2012-06-01 DIAGNOSIS — M67439 Ganglion, unspecified wrist: Secondary | ICD-10-CM

## 2012-06-01 NOTE — Patient Instructions (Signed)
activities as tolerated 

## 2012-06-01 NOTE — Progress Notes (Signed)
Patient ID: Jaime Murray, female   DOB: [birth year], 56 y.o.   MRN: [id number] Chief Complaint  Patient presents with  . Follow-up    Post op #1, right wrist.    Suture removal clean wound follow up as needed

## 2012-07-29 ENCOUNTER — Encounter: Admitting: Family Medicine

## 2012-09-21 ENCOUNTER — Encounter: Payer: Self-pay | Admitting: Family Medicine

## 2012-09-21 ENCOUNTER — Other Ambulatory Visit: Payer: Self-pay | Admitting: Family Medicine

## 2012-09-21 ENCOUNTER — Ambulatory Visit (INDEPENDENT_AMBULATORY_CARE_PROVIDER_SITE_OTHER): Admitting: Family Medicine

## 2012-09-21 VITALS — BP 116/72 | HR 100 | Resp 18 | Ht 62.0 in | Wt 141.0 lb

## 2012-09-21 DIAGNOSIS — I10 Essential (primary) hypertension: Secondary | ICD-10-CM

## 2012-09-21 DIAGNOSIS — D509 Iron deficiency anemia, unspecified: Secondary | ICD-10-CM

## 2012-09-21 DIAGNOSIS — F17200 Nicotine dependence, unspecified, uncomplicated: Secondary | ICD-10-CM

## 2012-09-21 DIAGNOSIS — Z139 Encounter for screening, unspecified: Secondary | ICD-10-CM

## 2012-09-21 DIAGNOSIS — E119 Type 2 diabetes mellitus without complications: Secondary | ICD-10-CM

## 2012-09-21 DIAGNOSIS — E785 Hyperlipidemia, unspecified: Secondary | ICD-10-CM

## 2012-09-21 LAB — COMPLETE METABOLIC PANEL WITH GFR
AST: 16 U/L (ref 0–37)
Albumin: 3.6 g/dL (ref 3.5–5.2)
Alkaline Phosphatase: 91 U/L (ref 39–117)
GFR, Est Non African American: >89 mL/min
Glucose, Bld: 93 mg/dL (ref 70–99)
Potassium: 4.2 mEq/L (ref 3.5–5.3)
Sodium: 139 mEq/L (ref 135–145)
Total Bilirubin: 0.2 mg/dL — ABNORMAL LOW (ref 0.3–1.2)
Total Protein: 6.6 g/dL (ref 6.0–8.3)

## 2012-09-21 LAB — CBC
HCT: 38.1 % (ref 36.0–46.0)
Hemoglobin: 12.7 g/dL (ref 12.0–15.0)
MCH: 25.6 pg — ABNORMAL LOW (ref 26.0–34.0)
MCHC: 33.3 g/dL (ref 30.0–36.0)
RDW: 15.4 % (ref 11.5–15.5)

## 2012-09-21 LAB — LIPID PANEL
Cholesterol: 136 mg/dL (ref 0–200)
HDL: 37 mg/dL — ABNORMAL LOW (ref >39)
Total CHOL/HDL Ratio: 3.7 Ratio
VLDL: 23 mg/dL (ref 0–40)

## 2012-09-21 LAB — HEMOGLOBIN A1C
Hgb A1c MFr Bld: 6.3 % — ABNORMAL HIGH (ref <5.7)
Mean Plasma Glucose: 134 mg/dL — ABNORMAL HIGH (ref <117)

## 2012-09-21 NOTE — Patient Instructions (Addendum)
CPE and pap 11/9 or after  Lipid, cmp and EGFr, HBa1C today, cbc and iron  Microalb from office today  It is important that you exercise regularly at least 30 minutes 5 times a week. If you develop chest pain, have severe difficulty breathing, or feel very tired, stop exercising immediately and seek medical attention   You need to set a quit date and stop smoking to reduce your risk of disease and improve your health  Fasting lipid,cmp and EGFR, HBA1C in Novemeber  You need to star once daily calcium with vit D 1200mg /1000IU gel capsule for bone health. Exercise is also good for bone health

## 2012-09-21 NOTE — Assessment & Plan Note (Signed)
Updated lab needed  Patient advised to reduce carb and sweets, commit to regular physical activity, take meds as prescribed, test blood as directed, and attempt to lose weight, to improve blood sugar control.  

## 2012-09-21 NOTE — Assessment & Plan Note (Signed)
Repeat lab today

## 2012-09-21 NOTE — Assessment & Plan Note (Signed)
Current 4 cigarettes per day, advised pt to discontinue cigarette use No definite quit date set Patient counseled for approximately 5 minutes regarding the health risks of ongoing nicotine use, specifically all types of cancer, heart disease, stroke and respiratory failure. The options available for help with cessation ,the behavioral changes to assist the process, and the option to either gradully reduce usage  Or abruptly stop.is also discussed. Pt is also encouraged to set specific goals in number of cigarettes used daily, as well as to set a quit date.

## 2012-09-21 NOTE — Assessment & Plan Note (Signed)
Hyperlipidemia:Low fat diet discussed and encouraged.  Updated lab today 

## 2012-09-21 NOTE — Addendum Note (Signed)
Addended by: Kandis Fantasia B on: 09/21/2012 09:31 AM   Modules accepted: Orders

## 2012-09-21 NOTE — Progress Notes (Signed)
  Subjective:    Patient ID: Jaime Murray, female    DOB: [birth year], 57 y.o.   MRN: [id number]  HPI The PT is here for follow up and re-evaluation of chronic medical conditions, medication management and review of any available recent lab and radiology data.  Preventive health is updated, specifically  Cancer screening and Immunization.   Questions or concerns regarding consultations or procedures which the PT has had in the interim are  addressed. The PT denies any adverse reactions to current medications since the last visit.  There are no new concerns.  There are no specific complaints       Review of Systems    See HPI Denies recent fever or chills. Denies sinus pressure, nasal congestion, ear pain or sore throat. Denies chest congestion, productive cough or wheezing. Denies chest pains, palpitations and leg swelling Denies abdominal pain, nausea, vomiting,diarrhea or constipation.   Denies dysuria, frequency, hesitancy or incontinence. Denies joint pain, swelling and limitation in mobility. Denies headaches, seizures, numbness, or tingling. Denies depression, anxiety or insomnia. Denies skin break down or rash.     Objective:   Physical Exam Patient alert and oriented and in no cardiopulmonary distress.  HEENT: No facial asymmetry, EOMI, no sinus tenderness,  oropharynx pink and moist.  Neck supple no adenopathy.  Chest: Clear to auscultation bilaterally.  CVS: S1, S2 no murmurs, no S3.  ABD: Soft non tender. Bowel sounds normal.  Ext: No edema  MS: Adequate ROM spine, shoulders, hips and knees.  Skin: Intact, no ulcerations or rash noted.  Psych: Good eye contact, normal affect. Memory intact not anxious or depressed appearing.  CNS: CN 2-12 intact, power, tone and sensation normal throughout.        Assessment & Plan:

## 2012-09-21 NOTE — Assessment & Plan Note (Signed)
Controlled, no change in medication DASH diet and commitment to daily physical activity for a minimum of 30 minutes discussed and encouraged, as a part of hypertension management. The importance of attaining a healthy weight is also discussed.  

## 2012-09-22 LAB — MICROALBUMIN / CREATININE URINE RATIO
Creatinine, Urine: 62.3 mg/dL
Microalb Creat Ratio: 8 mg/g (ref 0.0–30.0)
Microalb, Ur: 0.5 mg/dL (ref 0.00–1.89)

## 2012-09-28 ENCOUNTER — Ambulatory Visit (HOSPITAL_COMMUNITY)
Admission: RE | Admit: 2012-09-28 | Discharge: 2012-09-28 | Disposition: A | Discharge status: Home / Self Care | Source: Ambulatory Visit | Attending: Family Medicine | Admitting: Family Medicine

## 2012-09-28 DIAGNOSIS — Z139 Encounter for screening, unspecified: Secondary | ICD-10-CM

## 2012-09-28 DIAGNOSIS — Z1231 Encounter for screening mammogram for malignant neoplasm of breast: Secondary | ICD-10-CM | POA: Insufficient documentation

## 2012-12-22 ENCOUNTER — Other Ambulatory Visit: Payer: Self-pay | Admitting: Family Medicine

## 2013-03-04 ENCOUNTER — Other Ambulatory Visit: Payer: Self-pay | Admitting: Family Medicine

## 2013-04-09 ENCOUNTER — Ambulatory Visit (INDEPENDENT_AMBULATORY_CARE_PROVIDER_SITE_OTHER)

## 2013-04-09 ENCOUNTER — Telehealth: Payer: Self-pay

## 2013-04-09 DIAGNOSIS — E785 Hyperlipidemia, unspecified: Secondary | ICD-10-CM

## 2013-04-09 DIAGNOSIS — E119 Type 2 diabetes mellitus without complications: Secondary | ICD-10-CM

## 2013-04-09 DIAGNOSIS — Z23 Encounter for immunization: Secondary | ICD-10-CM

## 2013-04-09 NOTE — Telephone Encounter (Signed)
Had to order labs for patient to have done before appt

## 2013-04-15 ENCOUNTER — Other Ambulatory Visit: Payer: Self-pay | Admitting: Family Medicine

## 2013-04-16 ENCOUNTER — Other Ambulatory Visit: Payer: Self-pay | Admitting: Family Medicine

## 2013-05-15 ENCOUNTER — Other Ambulatory Visit: Payer: Self-pay | Admitting: Family Medicine

## 2013-06-01 ENCOUNTER — Telehealth: Payer: Self-pay | Admitting: Family Medicine

## 2013-06-04 NOTE — Telephone Encounter (Signed)
Pt need an app

## 2013-06-04 NOTE — Telephone Encounter (Signed)
pls see if CPE can be put in for next week instead , she also needs to have fasting labs before visit

## 2013-06-08 ENCOUNTER — Encounter: Admitting: Family Medicine

## 2013-06-08 LAB — COMPLETE METABOLIC PANEL WITH GFR
ALT: 9 U/L (ref 0–35)
AST: 14 U/L (ref 0–37)
Alkaline Phosphatase: 73 U/L (ref 39–117)
BUN: 7 mg/dL (ref 6–23)
Calcium: 9.3 mg/dL (ref 8.4–10.5)
Creat: 0.57 mg/dL (ref 0.50–1.10)
Total Bilirubin: 0.3 mg/dL (ref 0.3–1.2)

## 2013-06-08 LAB — LIPID PANEL
Cholesterol: 179 mg/dL (ref 0–200)
HDL: 45 mg/dL (ref >39)
LDL Cholesterol: 107 mg/dL — ABNORMAL HIGH (ref 0–99)
Total CHOL/HDL Ratio: 4 Ratio
Triglycerides: 136 mg/dL (ref <150)
VLDL: 27 mg/dL (ref 0–40)

## 2013-06-08 LAB — HEMOGLOBIN A1C
Hgb A1c MFr Bld: 6.5 % — ABNORMAL HIGH (ref <5.7)
Mean Plasma Glucose: 140 mg/dL — ABNORMAL HIGH (ref <117)

## 2013-06-10 ENCOUNTER — Encounter (INDEPENDENT_AMBULATORY_CARE_PROVIDER_SITE_OTHER): Payer: Self-pay

## 2013-06-10 ENCOUNTER — Encounter: Payer: Self-pay | Admitting: Family Medicine

## 2013-06-10 ENCOUNTER — Ambulatory Visit (INDEPENDENT_AMBULATORY_CARE_PROVIDER_SITE_OTHER): Admitting: Family Medicine

## 2013-06-10 ENCOUNTER — Encounter: Admitting: Family Medicine

## 2013-06-10 VITALS — BP 130/84 | HR 100 | Resp 16 | Ht 62.0 in | Wt 145.8 lb

## 2013-06-10 DIAGNOSIS — E119 Type 2 diabetes mellitus without complications: Secondary | ICD-10-CM

## 2013-06-10 DIAGNOSIS — I10 Essential (primary) hypertension: Secondary | ICD-10-CM

## 2013-06-10 DIAGNOSIS — E785 Hyperlipidemia, unspecified: Secondary | ICD-10-CM

## 2013-06-10 DIAGNOSIS — F17200 Nicotine dependence, unspecified, uncomplicated: Secondary | ICD-10-CM

## 2013-06-10 DIAGNOSIS — M545 Low back pain: Secondary | ICD-10-CM | POA: Insufficient documentation

## 2013-06-10 MED ORDER — PREDNISONE (PAK) 5 MG PO TABS: 5.0000 mg | ORAL_TABLET | ORAL | Status: DC

## 2013-06-10 MED ORDER — IBUPROFEN 800 MG PO TABS: 800.0000 mg | ORAL_TABLET | Freq: Three times a day (TID) | ORAL | Status: DC | PRN

## 2013-06-10 MED ORDER — KETOROLAC TROMETHAMINE 60 MG/2ML IM SOLN
60.0000 mg | Freq: Once | INTRAMUSCULAR | Status: AC
  Administered 2013-06-10: 60 mg via INTRAMUSCULAR

## 2013-06-10 MED ORDER — METHYLPREDNISOLONE ACETATE 80 MG/ML IJ SUSP
80.0000 mg | Freq: Once | INTRAMUSCULAR | Status: AC
  Administered 2013-06-10: 80 mg via INTRAMUSCULAR

## 2013-06-10 NOTE — Progress Notes (Signed)
  Subjective:    Patient ID: Jaime Murray, female    DOB: May 31, 1956, 57 y.o.   MRN: [id number]  HPI 2 week h/o acute back pain radiating down left lower extremity, no aggravating reason identified. Denies lower extremity weakness or numbness, no incontinence, first ever episode Has had a stressful year with the unexpected sudden death of her son in law, but is coping. Ongoing nicotine use with no plan to quit in the near future, down to 7 per day   Review of Systems See HPI Denies recent fever or chills. Denies sinus pressure, nasal congestion, ear pain or sore throat. Denies chest congestion, productive cough or wheezing. Denies chest pains, palpitations and leg swelling Denies abdominal pain, nausea, vomiting,diarrhea or constipation.   Denies dysuria, frequency, hesitancy or incontinence.  Denies headaches, seizures, numbness, or tingling. Denies uncontrolled  depression, anxiety or insomnia. Denies skin break down or rash.        Objective:   Physical Exam  Patient alert and oriented and in no cardiopulmonary distress.Pt in pain  HEENT: No facial asymmetry, EOMI, no sinus tenderness,  oropharynx pink and moist.  Neck supple no adenopathy.  Chest: Clear to auscultation bilaterally.  CVS: S1, S2 no murmurs, no S3.  ABD: Soft non tender. Bowel sounds normal.  Ext: No edema  MS: decreased  ROM lumbar spine,adequate in  shoulders, hips and knees.  Skin: Intact, no ulcerations or rash noted.  Psych: Good eye contact, normal affect. Memory intact not anxious or depressed appearing.  CNS: CN 2-12 intact, power, tone and sensation normal throughout.       Assessment & Plan:

## 2013-06-10 NOTE — Patient Instructions (Addendum)
cPE as before  Please cut back on cigarettes by one each month, so start  6 per day in December.  Toradol 60mg  and depo medrol 80mg  IM today for back pain for past 2 weeks, also xray of back today  Inbuprofen and prednisone are sent in for short term use to take care of the pain also.  Sorry that you did not get a response from the office   Please cut back on BUTTER, your bad cholesterol is higher than it should be  PLEASE bring ALL medication to next visit  It is important that you exercise regularly at least 30 minutes 5 times a week. If you develop chest pain, have severe difficulty breathing, or feel very tired, stop exercising immediately and seek medical attention

## 2013-06-12 NOTE — Assessment & Plan Note (Signed)
Unchanged, and unwilling to set quit date Patient counseled for approximately 5 minutes regarding the health risks of ongoing nicotine use, specifically all types of cancer, heart disease, stroke and respiratory failure. The options available for help with cessation ,the behavioral changes to assist the process, and the option to either gradully reduce usage  Or abruptly stop.is also discussed. Pt is also encouraged to set specific goals in number of cigarettes used daily, as well as to set a quit date.

## 2013-06-12 NOTE — Assessment & Plan Note (Signed)
Acute onset, first episode. Aggressive anti inflammatories, pt to call back if worsens or does not resolve within 1 week

## 2013-06-12 NOTE — Assessment & Plan Note (Signed)
Controlled, no change in medication Patient advised to reduce carb and sweets, commit to regular physical activity, take meds as prescribed, test blood as directed, and attempt to lose weight, to improve blood sugar control.  

## 2013-06-12 NOTE — Assessment & Plan Note (Signed)
Controlled, no change in medication DASH diet and commitment to daily physical activity for a minimum of 30 minutes discussed and encouraged, as a part of hypertension management. The importance of attaining a healthy weight is also discussed.  

## 2013-06-12 NOTE — Assessment & Plan Note (Signed)
Uncontrolled Hyperlipidemia:Low fat diet discussed and encouraged.  Compliance with medication stressed

## 2013-07-07 ENCOUNTER — Encounter: Admitting: Family Medicine

## 2013-07-29 ENCOUNTER — Other Ambulatory Visit: Payer: Self-pay | Admitting: Family Medicine

## 2013-09-10 ENCOUNTER — Encounter: Admitting: Family Medicine

## 2013-10-11 ENCOUNTER — Other Ambulatory Visit: Payer: Self-pay | Admitting: Family Medicine

## 2013-10-12 ENCOUNTER — Other Ambulatory Visit: Payer: Self-pay | Admitting: Family Medicine

## 2013-10-30 ENCOUNTER — Other Ambulatory Visit: Payer: Self-pay | Admitting: Family Medicine

## 2013-11-17 ENCOUNTER — Encounter (INDEPENDENT_AMBULATORY_CARE_PROVIDER_SITE_OTHER): Payer: Self-pay

## 2013-11-17 ENCOUNTER — Encounter: Payer: Self-pay | Admitting: Family Medicine

## 2013-11-17 ENCOUNTER — Ambulatory Visit (INDEPENDENT_AMBULATORY_CARE_PROVIDER_SITE_OTHER): Admitting: Family Medicine

## 2013-11-17 ENCOUNTER — Other Ambulatory Visit (HOSPITAL_COMMUNITY)
Admission: RE | Admit: 2013-11-17 | Discharge: 2013-11-17 | Disposition: A | Discharge status: Home / Self Care | Source: Ambulatory Visit | Attending: Family Medicine | Admitting: Family Medicine

## 2013-11-17 VITALS — BP 132/88 | HR 89 | Resp 16 | Wt 148.1 lb

## 2013-11-17 DIAGNOSIS — Z01419 Encounter for gynecological examination (general) (routine) without abnormal findings: Secondary | ICD-10-CM | POA: Insufficient documentation

## 2013-11-17 DIAGNOSIS — E119 Type 2 diabetes mellitus without complications: Secondary | ICD-10-CM

## 2013-11-17 DIAGNOSIS — Z0000 Encounter for general adult medical examination without abnormal findings: Secondary | ICD-10-CM

## 2013-11-17 DIAGNOSIS — Z124 Encounter for screening for malignant neoplasm of cervix: Secondary | ICD-10-CM

## 2013-11-17 DIAGNOSIS — Z1151 Encounter for screening for human papillomavirus (HPV): Secondary | ICD-10-CM | POA: Insufficient documentation

## 2013-11-17 DIAGNOSIS — R5381 Other malaise: Secondary | ICD-10-CM

## 2013-11-17 DIAGNOSIS — I10 Essential (primary) hypertension: Secondary | ICD-10-CM

## 2013-11-17 DIAGNOSIS — E785 Hyperlipidemia, unspecified: Secondary | ICD-10-CM

## 2013-11-17 DIAGNOSIS — F17200 Nicotine dependence, unspecified, uncomplicated: Secondary | ICD-10-CM

## 2013-11-17 DIAGNOSIS — R079 Chest pain, unspecified: Secondary | ICD-10-CM

## 2013-11-17 DIAGNOSIS — R5383 Other fatigue: Secondary | ICD-10-CM

## 2013-11-17 DIAGNOSIS — Z1211 Encounter for screening for malignant neoplasm of colon: Secondary | ICD-10-CM

## 2013-11-17 DIAGNOSIS — N3090 Cystitis, unspecified without hematuria: Secondary | ICD-10-CM

## 2013-11-17 LAB — POCT URINALYSIS DIPSTICK
GLUCOSE UA: NEGATIVE
Leukocytes, UA: NEGATIVE
Nitrite, UA: NEGATIVE
Protein, UA: NEGATIVE
SPEC GRAV UA: 1.02
Urobilinogen, UA: 2
pH, UA: 6

## 2013-11-17 LAB — POC HEMOCCULT BLD/STL (OFFICE/1-CARD/DIAGNOSTIC): Fecal Occult Blood, POC: NEGATIVE

## 2013-11-17 NOTE — Assessment & Plan Note (Addendum)
Will reduce by 1 cigarette per month, understands the need to quit, referred for chest scan Patient counseled for approximately 5 minutes regarding the health risks of ongoing nicotine use, specifically all types of cancer, heart disease, stroke and respiratory failure. The options available for help with cessation ,the behavioral changes to assist the process, and the option to either gradully reduce usage  Or abruptly stop.is also discussed. Pt is also encouraged to set specific goals in number of cigarettes used daily, as well as to set a quit date.

## 2013-11-17 NOTE — Patient Instructions (Addendum)
F/u in 4 month, call if you need me before  Labs this week fasting, CBC, TSH, hBA1C lipid, cmp and EGFR, microalb from office   Labs in 4 month, HBA1c , chem 7 and EGFR  When you get back hopefully you will be smoking no more than 10 cigarettes daily  You are referred for chest scan due to long smoking history.   EKG oin office toiday and referral to cardiologyu due to symptoms described

## 2013-11-17 NOTE — Progress Notes (Signed)
Subjective:    Patient ID: Jaime Murray, female    DOB: 1956-05-21, 58 y.o.   MRN: [id number]  HPI Pt in for annual exam C/o left sided chest pain with increased exertional fatigue in the past approximately 3 months. She continues to smoke , however is cutting back and acknowledges the need to quit to reduce her health risk. Denies polyuria , polydipsia or hypoglycemic episodes Denies depression or anxiety or insomnia   Review of Systems See HPI Denies recent fever or chills. Denies sinus pressure, nasal congestion, ear pain or sore throat. Denies chest congestion, productive cough or wheezing. Denies PND, orthopnea, palpitations and leg swelling Denies abdominal pain, nausea, vomiting,diarrhea  c/o left flank pain and urinary frequency with mild dysuria x 2 days . Denies joint pain, swelling and limitation in mobility. Denies headaches, seizures, numbness, or tingling. Denies depression, anxiety or insomnia. Denies skin break down or rash.        Objective:   Physical Exam BP 132/88  Pulse 89  Resp 16  Wt 148 lb 1.9 oz (67.187 kg)  SpO2 97% Pleasant well nourished female, alert and oriented x 3, in no cardio-pulmonary distress. Afebrile. HEENT No facial trauma or asymetry. Sinuses non tender.  EOMI, PERTL, fundoscopic exam , no hemorhage or exudate.  External ears normal, tympanic membranes clear. Oropharynx moist, no exudate, good dentition. Neck: supple, no adenopathy,JVD or thyromegaly.No bruits.  Chest: Clear to ascultation bilaterally.No crackles or wheezes.Decreased though adequate air entry, non tender to palpation Breast: No asymetry,no masses. No nipple discharge or inversion. No axillary or supraclavicular adenopathy  Cardiovascular system; Heart sounds normal,  S1 and  S2 ,no S3.  No murmur, or thrill. Apical beat not displaced Peripheral pulses normal.  Abdomen: Soft, non tender, no organomegaly or masses. No bruits. Bowel sounds  normal. No guarding, tenderness or rebound.  Rectal:  No mass. Guaiac negative stool.  GU: External genitalia normal. No lesions. Vaginal canal normal.No discharge. Uterus normal size, no adnexal masses, no cervical motion or adnexal tenderness.  Musculoskeletal exam: Full ROM of spine, hips , shoulders and knees. No deformity ,swelling or crepitus noted. No muscle wasting or atrophy.   Neurologic: Cranial nerves 2 to 12 intact. Power, tone ,sensation and reflexes normal throughout. No disturbance in gait. No tremor.  Skin: Intact, no ulceration, erythema , scaling or rash noted. Pigmentation normal throughout  Psych; Normal mood and affect. Judgement and concentration normal        Assessment & Plan:  NICOTINE ADDICTION Will reduce by 1 cigarette per month, understands the need to quit, referred for chest scan Patient counseled for approximately 5 minutes regarding the health risks of ongoing nicotine use, specifically all types of cancer, heart disease, stroke and respiratory failure. The options available for help with cessation ,the behavioral changes to assist the process, and the option to either gradully reduce usage  Or abruptly stop.is also discussed. Pt is also encouraged to set specific goals in number of cigarettes used daily, as well as to set a quit date.   Exertional chest pain Increased risk of CVD due to nicotine and type 2 DM. EKG suggestive of possible past  ischemia refer to cardiology for further eval Pt encouraged to work consistently on smoking cessation She knows to go to ED for worsened symptoms of chest pain  Unspecified essential hypertension Sub optimal control, hCTZ needs to be resumed , med sent in. DASH diet and commitment to daily physical activity for a minimum of 30  minutes discussed and encouraged, as a part of hypertension management. The importance of attaining a healthy weight is also discussed.   DM (diabetes  mellitus) Controlled, no change in medication Patient advised to reduce carb and sweets, commit to regular physical activity, take meds as prescribed, test blood as directed, and attempt to lose weight, to improve blood sugar control.   Hyperlipidemia LDL goal < 100 Elevated TG, needs to reduce fried and fatty foods and red meat. No med change  Routine general medical examination at a health care facility Annual exam as documented. Pt counseled and encouraged to work intentionally on smoking cessation by reducing nicotine usage slowly but intentionally Needs diabetic eye exam and is aware. Other health concerns voiced at visit and her chronic conditions were reviewed when labs  Became available, tele contact made   Cystitis Urinalysis checked in office and is negative for infection

## 2013-11-18 ENCOUNTER — Other Ambulatory Visit: Payer: Self-pay

## 2013-11-18 LAB — COMPLETE METABOLIC PANEL WITH GFR
ALBUMIN: 3.9 g/dL (ref 3.5–5.2)
ALT: 10 U/L (ref 0–35)
AST: 15 U/L (ref 0–37)
Alkaline Phosphatase: 65 U/L (ref 39–117)
BUN: 6 mg/dL (ref 6–23)
CHLORIDE: 101 meq/L (ref 96–112)
CO2: 30 mEq/L (ref 19–32)
CREATININE: 0.55 mg/dL (ref 0.50–1.10)
Calcium: 9.5 mg/dL (ref 8.4–10.5)
GFR, Est Non African American: >89 mL/min
Glucose, Bld: 113 mg/dL — ABNORMAL HIGH (ref 70–99)
Potassium: 3.8 mEq/L (ref 3.5–5.3)
Sodium: 142 mEq/L (ref 135–145)
Total Bilirubin: 0.4 mg/dL (ref 0.2–1.2)
Total Protein: 6.6 g/dL (ref 6.0–8.3)

## 2013-11-18 LAB — LIPID PANEL
CHOL/HDL RATIO: 3.7 ratio
Cholesterol: 176 mg/dL (ref 0–200)
HDL: 48 mg/dL (ref >39)
LDL Cholesterol: 95 mg/dL (ref 0–99)
Triglycerides: 167 mg/dL — ABNORMAL HIGH (ref <150)
VLDL: 33 mg/dL (ref 0–40)

## 2013-11-18 LAB — CBC WITH DIFFERENTIAL/PLATELET
BASOS ABS: 0 10*3/uL (ref 0.0–0.1)
Basophils Relative: 0 % (ref 0–1)
EOS ABS: 0.3 10*3/uL (ref 0.0–0.7)
EOS PCT: 3 % (ref 0–5)
HCT: 38.2 % (ref 36.0–46.0)
Hemoglobin: 13.1 g/dL (ref 12.0–15.0)
LYMPHS PCT: 31 % (ref 12–46)
Lymphs Abs: 3 10*3/uL (ref 0.7–4.0)
MCH: 27.6 pg (ref 26.0–34.0)
MCHC: 34.3 g/dL (ref 30.0–36.0)
MCV: 80.6 fL (ref 78.0–100.0)
Monocytes Absolute: 0.5 10*3/uL (ref 0.1–1.0)
Monocytes Relative: 5 % (ref 3–12)
NEUTROS PCT: 61 % (ref 43–77)
Neutro Abs: 5.9 10*3/uL (ref 1.7–7.7)
PLATELETS: 275 10*3/uL (ref 150–400)
RBC: 4.74 MIL/uL (ref 3.87–5.11)
RDW: 13.6 % (ref 11.5–15.5)
WBC: 9.6 10*3/uL (ref 4.0–10.5)

## 2013-11-18 LAB — MICROALBUMIN / CREATININE URINE RATIO
Creatinine, Urine: 393.3 mg/dL
Microalb Creat Ratio: 4.1 mg/g (ref 0.0–30.0)
Microalb, Ur: 1.6 mg/dL (ref 0.00–1.89)

## 2013-11-18 LAB — HEMOGLOBIN A1C
Hgb A1c MFr Bld: 6.6 % — ABNORMAL HIGH (ref <5.7)
MEAN PLASMA GLUCOSE: 143 mg/dL — AB (ref <117)

## 2013-11-18 MED ORDER — LOVASTATIN 40 MG PO TABS: ORAL_TABLET | ORAL | Status: DC

## 2013-11-18 MED ORDER — NITROGLYCERIN 0.4 MG SL SUBL: 0.4000 mg | SUBLINGUAL_TABLET | SUBLINGUAL | Status: DC | PRN

## 2013-11-18 MED ORDER — SERTRALINE HCL 25 MG PO TABS: ORAL_TABLET | ORAL | Status: DC

## 2013-11-18 MED ORDER — METFORMIN HCL 500 MG PO TABS: ORAL_TABLET | ORAL | Status: DC

## 2013-11-18 MED ORDER — BENAZEPRIL HCL 20 MG PO TABS: ORAL_TABLET | ORAL | Status: DC

## 2013-11-18 MED ORDER — OMEPRAZOLE 20 MG PO CPDR: DELAYED_RELEASE_CAPSULE | ORAL | Status: DC

## 2013-11-18 MED ORDER — HYDROCHLOROTHIAZIDE 25 MG PO TABS: ORAL_TABLET | ORAL | Status: DC

## 2013-11-20 DIAGNOSIS — N3090 Cystitis, unspecified without hematuria: Secondary | ICD-10-CM | POA: Insufficient documentation

## 2013-11-20 DIAGNOSIS — Z0000 Encounter for general adult medical examination without abnormal findings: Secondary | ICD-10-CM | POA: Insufficient documentation

## 2013-11-20 NOTE — Assessment & Plan Note (Signed)
Urinalysis checked in office and is negative for infection

## 2013-11-20 NOTE — Assessment & Plan Note (Signed)
Increased risk of CVD due to nicotine and type 2 DM. EKG suggestive of possible past  ischemia refer to cardiology for further eval Pt encouraged to work consistently on smoking cessation She knows to go to ED for worsened symptoms of chest pain

## 2013-11-20 NOTE — Assessment & Plan Note (Signed)
Elevated TG, needs to reduce fried and fatty foods and red meat. No med change

## 2013-11-20 NOTE — Assessment & Plan Note (Addendum)
Sub optimal control, hCTZ needs to be resumed , med sent in. DASH diet and commitment to daily physical activity for a minimum of 30 minutes discussed and encouraged, as a part of hypertension management. The importance of attaining a healthy weight is also discussed.

## 2013-11-20 NOTE — Assessment & Plan Note (Signed)
Controlled, no change in medication Patient advised to reduce carb and sweets, commit to regular physical activity, take meds as prescribed, test blood as directed, and attempt to lose weight, to improve blood sugar control.  

## 2013-11-20 NOTE — Assessment & Plan Note (Signed)
Annual exam as documented. Pt counseled and encouraged to work intentionally on smoking cessation by reducing nicotine usage slowly but intentionally Needs diabetic eye exam and is aware. Other health concerns voiced at visit and her chronic conditions were reviewed when labs  Became available, tele contact made

## 2013-11-25 ENCOUNTER — Ambulatory Visit (HOSPITAL_COMMUNITY)
Admission: RE | Admit: 2013-11-25 | Discharge: 2013-11-25 | Disposition: A | Discharge status: Home / Self Care | Source: Ambulatory Visit | Attending: Family Medicine | Admitting: Family Medicine

## 2013-11-25 DIAGNOSIS — R05 Cough: Secondary | ICD-10-CM | POA: Insufficient documentation

## 2013-11-25 DIAGNOSIS — F17200 Nicotine dependence, unspecified, uncomplicated: Secondary | ICD-10-CM

## 2013-11-25 DIAGNOSIS — K7689 Other specified diseases of liver: Secondary | ICD-10-CM | POA: Insufficient documentation

## 2013-11-25 DIAGNOSIS — R0602 Shortness of breath: Secondary | ICD-10-CM | POA: Insufficient documentation

## 2013-11-25 DIAGNOSIS — K449 Diaphragmatic hernia without obstruction or gangrene: Secondary | ICD-10-CM | POA: Insufficient documentation

## 2013-11-25 DIAGNOSIS — R918 Other nonspecific abnormal finding of lung field: Secondary | ICD-10-CM | POA: Insufficient documentation

## 2013-11-25 DIAGNOSIS — R059 Cough, unspecified: Secondary | ICD-10-CM | POA: Insufficient documentation

## 2013-11-25 MED ORDER — IOHEXOL 300 MG/ML  SOLN
80.0000 mL | Freq: Once | INTRAMUSCULAR | Status: AC | PRN
  Administered 2013-11-25: 80 mL via INTRAVENOUS

## 2013-12-01 ENCOUNTER — Encounter: Payer: Self-pay | Admitting: Cardiovascular Disease

## 2013-12-01 ENCOUNTER — Ambulatory Visit (INDEPENDENT_AMBULATORY_CARE_PROVIDER_SITE_OTHER): Admitting: Cardiovascular Disease

## 2013-12-01 ENCOUNTER — Encounter: Payer: Self-pay | Admitting: *Deleted

## 2013-12-01 VITALS — BP 100/60 | HR 78 | Ht 59.0 in | Wt 142.0 lb

## 2013-12-01 DIAGNOSIS — R9431 Abnormal electrocardiogram [ECG] [EKG]: Secondary | ICD-10-CM

## 2013-12-01 DIAGNOSIS — E785 Hyperlipidemia, unspecified: Secondary | ICD-10-CM

## 2013-12-01 DIAGNOSIS — R079 Chest pain, unspecified: Secondary | ICD-10-CM

## 2013-12-01 DIAGNOSIS — R0602 Shortness of breath: Secondary | ICD-10-CM

## 2013-12-01 DIAGNOSIS — F17200 Nicotine dependence, unspecified, uncomplicated: Secondary | ICD-10-CM

## 2013-12-01 DIAGNOSIS — Z720 Tobacco use: Secondary | ICD-10-CM

## 2013-12-01 DIAGNOSIS — E119 Type 2 diabetes mellitus without complications: Secondary | ICD-10-CM

## 2013-12-01 DIAGNOSIS — I10 Essential (primary) hypertension: Secondary | ICD-10-CM

## 2013-12-01 NOTE — Progress Notes (Signed)
Patient ID: Jaime Murray, female   DOB: [birth year], 58 y.o.   MRN: [id number]       CARDIOLOGY CONSULT NOTE  Patient ID: Jaime Murray MRN: [id number] DOB/AGE: 09-24-1955 58 y.o.  Admit date: (Not on file) Primary Physician Tula Nakayama, MD  Reason for Consultation: chest pain  HPI: The patient is a 58 year old woman with a past medical history significant for hypertension, hyperlipidemia, tobacco abuse, and diabetes mellitus. She is referred for the evaluation of chest pain. An ECG at her primary care provider's office reportedly showed evidence of ischemia, but this is not currently available to me. She underwent normal nuclear stress testing in August 2006. Chest CT performed earlier this month demonstrated a large hiatal hernia, small bilateral pulmonary nodules, and a fatty liver. She said that she has not experienced any chest pain in approximately 2 months. She had been experiencing intermittent chest pains over the past one year. They are located on the left upper portion of her chest. They can occur both with and without exertion. They have been associated with diaphoresis and shortness of breath. She has also noticed that over the past one year, she gets markedly short of breath after vacuuming or sweeping. She had been smoking 1-1/2 packs of cigarettes daily and now smokes a half a pack a day. She denies orthopnea, palpitations, leg swelling, lightheadedness, and paroxysmal nocturnal dyspnea.  An ECG performed in the office today shows normal sinus rhythm with sinus arrhythmia, and a diffuse nonspecific T wave abnormality.  Fam: Father with HTN and TIA's, mother with HTN.    No Known Allergies  Current Outpatient Prescriptions  Medication Sig Dispense Refill  . acetaminophen (TYLENOL) 325 MG tablet Take 650 mg by mouth every 6 (six) hours as needed. For pain      . aspirin 81 MG tablet Take 81 mg by mouth daily.        . benazepril (LOTENSIN) 20 MG tablet TAKE 1  TABLET DAILY  90 tablet  1  . cetirizine (ZYRTEC) 10 MG tablet Take 10 mg by mouth daily as needed. Allergies      . cloNIDine (CATAPRES) 0.1 MG tablet TAKE 1 TABLET AT BEDTIME  90 tablet  0  . COD LIVER OIL PO Take 1 tablet by mouth daily.      . ferrous sulfate dried (SLOW FE) 160 (50 FE) MG TBCR Take 160 mg by mouth daily.        . hydrochlorothiazide (HYDRODIURIL) 25 MG tablet TAKE 1 TABLET DAILY  90 tablet  1  . ibuprofen (ADVIL,MOTRIN) 800 MG tablet Take 1 tablet (800 mg total) by mouth every 8 (eight) hours as needed.  30 tablet  0  . lovastatin (MEVACOR) 40 MG tablet TAKE 1 TABLET AT BEDTIME  90 tablet  1  . metFORMIN (GLUCOPHAGE) 500 MG tablet TAKE 1 TABLET DAILY WITH BREAKFAST  90 tablet  1  . nitroGLYCERIN (NITROSTAT) 0.4 MG SL tablet Place 1 tablet (0.4 mg total) under the tongue every 5 (five) minutes x 3 doses as needed. For chest pain  30 tablet  1  . omeprazole (PRILOSEC) 20 MG capsule TAKE 1 CAPSULE DAILY  90 capsule  1  . potassium chloride SA (K-DUR,KLOR-CON) 20 MEQ tablet Take 1 tablet (20 mEq total) by mouth 2 (two) times daily.  180 tablet  1  . RABEprazole (ACIPHEX) 20 MG tablet Take 20 mg by mouth daily.      . sertraline (ZOLOFT) 25 MG tablet TAKE 1  TABLET DAILY  90 tablet  1   Current Facility-Administered Medications  Medication Dose Route Frequency Provider Last Rate Last Dose  . Influenza (>/= 3 years) inactive virus vaccine (FLVIRIN/FLUZONE) injection SUSP 0.5 mL  0.5 mL Intramuscular Once Fayrene Helper, MD        Past Medical History  Diagnosis Date  . GERD (gastroesophageal reflux disease)   . Constipation   . Fatigue   . IDA (iron deficiency anemia)   . DM (diabetes mellitus)   . HTN (hypertension)   . Hypercholesteremia   . Anxiety   . Depression     Past Surgical History  Procedure Laterality Date  . Ganglion cyst excision  05/22/2012    Procedure: REMOVAL GANGLION OF WRIST;  Surgeon: Carole Civil, MD;  Location: [facility];  Service:  Orthopedics;  Laterality: Left;    History   Social History  . Marital Status: Married    Spouse Name: N/A    Number of Children: N/A  . Years of Education: 12   Occupational History  .     Social History Main Topics  . Smoking status: Current Every Day Smoker -- 0.50 packs/day for 30 years    Types: Cigarettes  . Smokeless tobacco: Not on file  . Alcohol Use: 4.8 oz/week    8 Glasses of wine per week  . Drug Use: No  . Sexual Activity: Yes    Birth Control/ Protection: Post-menopausal   Other Topics Concern  . Not on file   Social History Narrative  . No narrative on file     No family history of premature CAD in 1st degree relatives.  Prior to Admission medications   Medication Sig Start Date End Date Taking? Authorizing Provider  acetaminophen (TYLENOL) 325 MG tablet Take 650 mg by mouth every 6 (six) hours as needed. For pain   Yes Historical Provider, MD  aspirin 81 MG tablet Take 81 mg by mouth daily.     Yes Historical Provider, MD  benazepril (LOTENSIN) 20 MG tablet TAKE 1 TABLET DAILY 11/18/13  Yes Fayrene Helper, MD  cetirizine (ZYRTEC) 10 MG tablet Take 10 mg by mouth daily as needed. Allergies   Yes Historical Provider, MD  cloNIDine (CATAPRES) 0.1 MG tablet TAKE 1 TABLET AT BEDTIME 07/29/13  Yes Fayrene Helper, MD  COD LIVER OIL PO Take 1 tablet by mouth daily.   Yes Historical Provider, MD  ferrous sulfate dried (SLOW FE) 160 (50 FE) MG TBCR Take 160 mg by mouth daily.     Yes Historical Provider, MD  hydrochlorothiazide (HYDRODIURIL) 25 MG tablet TAKE 1 TABLET DAILY 11/18/13  Yes Fayrene Helper, MD  ibuprofen (ADVIL,MOTRIN) 800 MG tablet Take 1 tablet (800 mg total) by mouth every 8 (eight) hours as needed. 06/10/13  Yes Fayrene Helper, MD  lovastatin (MEVACOR) 40 MG tablet TAKE 1 TABLET AT BEDTIME 11/18/13  Yes Fayrene Helper, MD  metFORMIN (GLUCOPHAGE) 500 MG tablet TAKE 1 TABLET DAILY WITH BREAKFAST 11/18/13  Yes Fayrene Helper, MD    nitroGLYCERIN (NITROSTAT) 0.4 MG SL tablet Place 1 tablet (0.4 mg total) under the tongue every 5 (five) minutes x 3 doses as needed. For chest pain 11/18/13  Yes Fayrene Helper, MD  omeprazole (PRILOSEC) 20 MG capsule TAKE 1 CAPSULE DAILY 11/18/13  Yes Fayrene Helper, MD  potassium chloride SA (K-DUR,KLOR-CON) 20 MEQ tablet Take 1 tablet (20 mEq total) by mouth 2 (two) times daily. 04/15/12  Yes  Fayrene Helper, MD  RABEprazole (ACIPHEX) 20 MG tablet Take 20 mg by mouth daily.   Yes Historical Provider, MD  sertraline (ZOLOFT) 25 MG tablet TAKE 1 TABLET DAILY 11/18/13  Yes Fayrene Helper, MD     Review of systems complete and found to be negative unless listed above in HPI     Physical exam Blood pressure 100/60, pulse 78, height 4\' 11"  (1.499 m), weight 142 lb (64.411 kg). General: NAD Neck: No JVD, no thyromegaly or thyroid nodule.  Lungs: Clear to auscultation bilaterally with normal respiratory effort. CV: Nondisplaced PMI.  Heart regular S1/S2, no S3/S4, no murmur.  No peripheral edema.  No carotid bruit.  Normal pedal pulses.  Abdomen: Soft, nontender, no hepatosplenomegaly, no distention.  Skin: Intact without lesions or rashes.  Neurologic: Alert and oriented x 3.  Psych: Normal affect. Extremities: No clubbing or cyanosis.  HEENT: Normal.   Labs:   Lab Results  Component Value Date   WBC 9.6 11/18/2013   HGB 13.1 11/18/2013   HCT 38.2 11/18/2013   MCV 80.6 11/18/2013   PLT 275 11/18/2013   No results found for this basename: NA, K, CL, CO2, BUN, CREATININE, CALCIUM, LABALBU, PROT, BILITOT, ALKPHOS, ALT, AST, GLUCOSE,  in the last 168 hours Lab Results  Component Value Date   CKTOTAL 62 02/17/2009   TROPONINI 0.02 02/17/2009    Lab Results  Component Value Date   CHOL 176 11/18/2013   CHOL 179 06/07/2013   CHOL 136 09/21/2012   Lab Results  Component Value Date   HDL 48 11/18/2013   HDL 45 06/07/2013   HDL 37* 09/21/2012   Lab Results  Component Value  Date   LDLCALC 95 11/18/2013   LDLCALC 107* 06/07/2013   LDLCALC 76 09/21/2012   Lab Results  Component Value Date   TRIG 167* 11/18/2013   TRIG 136 06/07/2013   TRIG 115 09/21/2012   Lab Results  Component Value Date   CHOLHDL 3.7 11/18/2013   CHOLHDL 4.0 06/07/2013   CHOLHDL 3.7 09/21/2012   No results found for this basename: LDLDIRECT         Studies: See HPI  ASSESSMENT AND PLAN:  1. Chest pain and shortness of breath: She has multiple cardiovascular risk factors as stated above. Her ECG findings, while abnormal, are nonspecific. I will proceed with an echocardiogram to assess left ventricular systolic function and regional wall motion. I will obtain an exercise Cardiolite to evaluate for ischemia. While her shortness of breath may represent an anginal equivalent, she may also have underlying COPD given her long history of tobacco abuse. I will follow up with her in one month. 2. HTN: Controlled on present therapy which includes benazepril, clonidine, and hydrochlorothiazide. 3. Hyperlipidemia: Currently on lovastatin 40 mg daily.  Dispo: f/u 1 month.  Signed: Kate Sable, M.D., F.A.C.C.  12/01/2013, 8:51 AM

## 2013-12-01 NOTE — Addendum Note (Signed)
Addended by: Truett Mainland on: 12/01/2013 09:35 AM   Modules accepted: Orders

## 2013-12-01 NOTE — Patient Instructions (Signed)
Your physician recommends that you schedule a follow-up appointment in: 1 month with Dr Bronson Ing  Your physician has requested that you have en exercise stress myoview. For further information please visit [email or web address]. Please follow instruction sheet, as given.  Your physician has requested that you have an echocardiogram. Echocardiography is a painless test that uses sound waves to create images of your heart. It provides your doctor with information about the size and shape of your heart and how well your heart's chambers and valves are working. This procedure takes approximately one hour. There are no restrictions for this procedure.

## 2013-12-08 ENCOUNTER — Encounter (HOSPITAL_COMMUNITY)
Admission: RE | Admit: 2013-12-08 | Discharge: 2013-12-08 | Disposition: A | Discharge status: Home / Self Care | Source: Ambulatory Visit | Attending: Cardiovascular Disease | Admitting: Cardiovascular Disease

## 2013-12-08 ENCOUNTER — Encounter (HOSPITAL_COMMUNITY): Payer: Self-pay

## 2013-12-08 ENCOUNTER — Ambulatory Visit (HOSPITAL_COMMUNITY)
Admission: RE | Admit: 2013-12-08 | Discharge: 2013-12-08 | Disposition: A | Discharge status: Home / Self Care | Source: Ambulatory Visit | Attending: Cardiovascular Disease | Admitting: Cardiovascular Disease

## 2013-12-08 DIAGNOSIS — R079 Chest pain, unspecified: Secondary | ICD-10-CM | POA: Insufficient documentation

## 2013-12-08 DIAGNOSIS — E785 Hyperlipidemia, unspecified: Secondary | ICD-10-CM | POA: Insufficient documentation

## 2013-12-08 DIAGNOSIS — R0609 Other forms of dyspnea: Secondary | ICD-10-CM | POA: Insufficient documentation

## 2013-12-08 DIAGNOSIS — E119 Type 2 diabetes mellitus without complications: Secondary | ICD-10-CM | POA: Insufficient documentation

## 2013-12-08 DIAGNOSIS — R0989 Other specified symptoms and signs involving the circulatory and respiratory systems: Secondary | ICD-10-CM | POA: Insufficient documentation

## 2013-12-08 DIAGNOSIS — F17200 Nicotine dependence, unspecified, uncomplicated: Secondary | ICD-10-CM | POA: Insufficient documentation

## 2013-12-08 DIAGNOSIS — R072 Precordial pain: Secondary | ICD-10-CM

## 2013-12-08 DIAGNOSIS — I10 Essential (primary) hypertension: Secondary | ICD-10-CM | POA: Insufficient documentation

## 2013-12-08 MED ORDER — TECHNETIUM TC 99M SESTAMIBI GENERIC - CARDIOLITE
10.0000 | Freq: Once | INTRAVENOUS | Status: AC | PRN
  Administered 2013-12-08: 10 via INTRAVENOUS

## 2013-12-08 MED ORDER — TECHNETIUM TC 99M SESTAMIBI - CARDIOLITE
30.0000 | Freq: Once | INTRAVENOUS | Status: AC | PRN
  Administered 2013-12-08: 12:00:00 30 via INTRAVENOUS

## 2013-12-08 NOTE — Progress Notes (Signed)
  Echocardiogram 2D Echocardiogram has been performed.  Jaime Murray Jaime Murray 12/08/2013, 8:55 AM

## 2013-12-08 NOTE — Progress Notes (Signed)
Stress Lab Nurses Notes - [place] 12/08/2013 Reason for doing test: Chest Pain and Dyspnea Type of test: stress cardiolite Nurse performing test: Carvel Getting, RN Nuclear Medicine Tech: Dyanne Carrel Echo Tech: Not Applicable MD performing test: Clearance Coots.Bonnell Public PA Family MD: Tula Nakayama Test explained and consent signed: yes IV started: 22g jelco, Saline lock flushed and Saline lock started in radiology Symptoms:  legs tired and SOB Treatment/Intervention: None Reason test stopped: reached target HR After recovery IV was: Discontinued via X-ray tech, No redness or edema and Saline Lock flushed Patient to return to [facility]. Med at :12:00 Patient discharged: Home Patient's Condition upon discharge was: stable Comments: Patient walked 7 minutes 31 seconds, Exercise HR was 112 and BP was 152/85 and Recovery HR was 80and Recovery BP was 115/83. Symptoms resolved in recovery. Norlene Duel

## 2013-12-10 ENCOUNTER — Encounter: Payer: Self-pay | Admitting: Family Medicine

## 2013-12-29 ENCOUNTER — Ambulatory Visit (INDEPENDENT_AMBULATORY_CARE_PROVIDER_SITE_OTHER): Admitting: Cardiovascular Disease

## 2013-12-29 ENCOUNTER — Encounter: Payer: Self-pay | Admitting: Cardiovascular Disease

## 2013-12-29 VITALS — BP 120/78 | HR 84 | Ht 59.0 in | Wt 145.0 lb

## 2013-12-29 DIAGNOSIS — I10 Essential (primary) hypertension: Secondary | ICD-10-CM

## 2013-12-29 DIAGNOSIS — E119 Type 2 diabetes mellitus without complications: Secondary | ICD-10-CM

## 2013-12-29 DIAGNOSIS — Z720 Tobacco use: Secondary | ICD-10-CM

## 2013-12-29 DIAGNOSIS — F17200 Nicotine dependence, unspecified, uncomplicated: Secondary | ICD-10-CM

## 2013-12-29 DIAGNOSIS — IMO0001 Reserved for inherently not codable concepts without codable children: Secondary | ICD-10-CM

## 2013-12-29 DIAGNOSIS — R0602 Shortness of breath: Secondary | ICD-10-CM

## 2013-12-29 DIAGNOSIS — Z136 Encounter for screening for cardiovascular disorders: Secondary | ICD-10-CM

## 2013-12-29 DIAGNOSIS — E785 Hyperlipidemia, unspecified: Secondary | ICD-10-CM

## 2013-12-29 NOTE — Progress Notes (Signed)
Patient ID: Jaime Murray, female   DOB: 1956-01-02, 58 y.o.   MRN: [id number]      SUBJECTIVE: The patient is here to followup on the results of cardiovascular testing performed for the evaluation of chest pain and shortness of breath, in the context of multiple cardiovascular risk factors. Nuclear stress testing was normal. Echocardiography demonstrated normal left ventricular systolic function, EF 01-02%, with grade 2 diastolic dysfunction. She has since cut back her smoking from one pack daily to one pack every 2-1/2 days. She has not had any further chest pains. Her previous CT did show a large hiatal hernia.  No Known Allergies  Current Outpatient Prescriptions  Medication Sig Dispense Refill  . acetaminophen (TYLENOL) 325 MG tablet Take 650 mg by mouth every 6 (six) hours as needed. For pain      . aspirin 81 MG tablet Take 81 mg by mouth daily.        . benazepril (LOTENSIN) 20 MG tablet TAKE 1 TABLET DAILY  90 tablet  1  . cetirizine (ZYRTEC) 10 MG tablet Take 10 mg by mouth daily as needed. Allergies      . cloNIDine (CATAPRES) 0.1 MG tablet TAKE 1 TABLET AT BEDTIME  90 tablet  0  . COD LIVER OIL PO Take 1 tablet by mouth daily.      . ferrous sulfate dried (SLOW FE) 160 (50 FE) MG TBCR Take 160 mg by mouth daily.        . hydrochlorothiazide (HYDRODIURIL) 25 MG tablet TAKE 1 TABLET DAILY  90 tablet  1  . ibuprofen (ADVIL,MOTRIN) 800 MG tablet Take 1 tablet (800 mg total) by mouth every 8 (eight) hours as needed.  30 tablet  0  . lovastatin (MEVACOR) 40 MG tablet TAKE 1 TABLET AT BEDTIME  90 tablet  1  . metFORMIN (GLUCOPHAGE) 500 MG tablet TAKE 1 TABLET DAILY WITH BREAKFAST  90 tablet  1  . nitroGLYCERIN (NITROSTAT) 0.4 MG SL tablet Place 1 tablet (0.4 mg total) under the tongue every 5 (five) minutes x 3 doses as needed. For chest pain  30 tablet  1  . omeprazole (PRILOSEC) 20 MG capsule TAKE 1 CAPSULE DAILY  90 capsule  1  . potassium chloride SA (K-DUR,KLOR-CON) 20 MEQ  tablet Take 1 tablet (20 mEq total) by mouth 2 (two) times daily.  180 tablet  1  . RABEprazole (ACIPHEX) 20 MG tablet Take 20 mg by mouth daily.      . sertraline (ZOLOFT) 25 MG tablet TAKE 1 TABLET DAILY  90 tablet  1   Current Facility-Administered Medications  Medication Dose Route Frequency Provider Last Rate Last Dose  . Influenza (>/= 3 years) inactive virus vaccine (FLVIRIN/FLUZONE) injection SUSP 0.5 mL  0.5 mL Intramuscular Once Fayrene Helper, MD        Past Medical History  Diagnosis Date  . GERD (gastroesophageal reflux disease)   . Constipation   . Fatigue   . IDA (iron deficiency anemia)   . DM (diabetes mellitus)   . HTN (hypertension)   . Hypercholesteremia   . Anxiety   . Depression     Past Surgical History  Procedure Laterality Date  . Ganglion cyst excision  05/22/2012    Procedure: REMOVAL GANGLION OF WRIST;  Surgeon: Carole Civil, MD;  Location: [facility];  Service: Orthopedics;  Laterality: Left;    History   Social History  . Marital Status: Married    Spouse Name: N/A  Number of Children: N/A  . Years of Education: 12   Occupational History  .     Social History Main Topics  . Smoking status: Current Every Day Smoker -- 0.50 packs/day for 30 years    Types: Cigarettes  . Smokeless tobacco: Not on file  . Alcohol Use: 4.8 oz/week    8 Glasses of wine per week  . Drug Use: No  . Sexual Activity: Yes    Birth Control/ Protection: Post-menopausal   Other Topics Concern  . Not on file   Social History Narrative  . No narrative on file     Filed Vitals:   12/29/13 0923  BP: 120/78  Pulse: 84  Height: 4\' 11"  (1.499 m)  Weight: 145 lb (65.772 kg)  SpO2: 97%    PHYSICAL EXAM General: NAD Neck: No JVD, no thyromegaly. Lungs: Diminished at the bases but otherwise clear to auscultation bilaterally. CV: Nondisplaced PMI.  Regular rate and rhythm, normal S1/S2, no S3/S4, no murmur. No pretibial or periankle edema.  No carotid  bruit.  Normal pedal pulses.  Abdomen: Soft, nontender, no hepatosplenomegaly, no distention.  Neurologic: Alert and oriented x 3.  Psych: Normal affect. Extremities: No clubbing or cyanosis.   ECG: reviewed and available in electronic records.      ASSESSMENT AND PLAN: 1. Chest pain and shortness of breath: She has multiple cardiovascular risk factors as stated above. Test results as noted above. Chest pains could be related to her large hiatal hernia. She may also have underlying COPD given her long history of tobacco abuse. I will proceed with PFT's. 2. HTN: Controlled on present therapy which includes benazepril, clonidine, and hydrochlorothiazide. She has grade II diastolic dysfunction. 3. Hyperlipidemia: Currently on lovastatin 40 mg daily.  4. Grade II diastolic dysfunction: No evidence of pulmonary or leg edema. Blood pressure is optimally controlled.  Dispo: f/u 6 months.  Kate Sable, M.D., F.A.C.C.

## 2013-12-29 NOTE — Patient Instructions (Signed)
Your physician recommends that you schedule a follow-up appointment in: 6 months with Dr Virgina Jock will receive a reminder letter two months in advance reminding you to call and schedule your appointment. If you don't receive this letter, please contact our office.  Your physician has recommended that you have a pulmonary function test. Pulmonary Function Tests are a group of tests that measure how well air moves in and out of your lungs.

## 2014-03-21 ENCOUNTER — Ambulatory Visit: Admitting: Family Medicine

## 2014-03-31 ENCOUNTER — Ambulatory Visit (INDEPENDENT_AMBULATORY_CARE_PROVIDER_SITE_OTHER)

## 2014-03-31 DIAGNOSIS — Z23 Encounter for immunization: Secondary | ICD-10-CM

## 2014-04-22 ENCOUNTER — Other Ambulatory Visit: Payer: Self-pay

## 2014-04-22 LAB — BASIC METABOLIC PANEL WITH GFR
BUN: 6 mg/dL (ref 6–23)
CALCIUM: 9.4 mg/dL (ref 8.4–10.5)
CO2: 28 mEq/L (ref 19–32)
Chloride: 102 mEq/L (ref 96–112)
Creat: 0.61 mg/dL (ref 0.50–1.10)
GFR, Est African American: >89 mL/min
Glucose, Bld: 100 mg/dL — ABNORMAL HIGH (ref 70–99)
Potassium: 3.9 mEq/L (ref 3.5–5.3)
SODIUM: 141 meq/L (ref 135–145)

## 2014-04-22 LAB — HEMOGLOBIN A1C
HEMOGLOBIN A1C: 6.7 % — AB (ref <5.7)
Mean Plasma Glucose: 146 mg/dL — ABNORMAL HIGH (ref <117)

## 2014-04-22 LAB — TSH: TSH: 1.979 u[IU]/mL (ref 0.350–4.500)

## 2014-04-22 MED ORDER — CLONIDINE HCL 0.1 MG PO TABS: ORAL_TABLET | ORAL | Status: DC

## 2014-04-28 ENCOUNTER — Encounter: Payer: Self-pay | Admitting: Family Medicine

## 2014-04-28 ENCOUNTER — Ambulatory Visit (INDEPENDENT_AMBULATORY_CARE_PROVIDER_SITE_OTHER): Admitting: Family Medicine

## 2014-04-28 VITALS — BP 120/80 | HR 87 | Resp 16 | Ht 59.0 in | Wt 148.0 lb

## 2014-04-28 DIAGNOSIS — Z008 Encounter for other general examination: Secondary | ICD-10-CM

## 2014-04-28 DIAGNOSIS — I10 Essential (primary) hypertension: Secondary | ICD-10-CM

## 2014-04-28 DIAGNOSIS — Z0189 Encounter for other specified special examinations: Secondary | ICD-10-CM

## 2014-04-28 DIAGNOSIS — Z720 Tobacco use: Secondary | ICD-10-CM

## 2014-04-28 DIAGNOSIS — G4730 Sleep apnea, unspecified: Secondary | ICD-10-CM

## 2014-04-28 DIAGNOSIS — E785 Hyperlipidemia, unspecified: Secondary | ICD-10-CM

## 2014-04-28 DIAGNOSIS — E119 Type 2 diabetes mellitus without complications: Secondary | ICD-10-CM

## 2014-04-28 DIAGNOSIS — F17200 Nicotine dependence, unspecified, uncomplicated: Secondary | ICD-10-CM

## 2014-04-28 DIAGNOSIS — G478 Other sleep disorders: Secondary | ICD-10-CM

## 2014-04-28 DIAGNOSIS — M159 Polyosteoarthritis, unspecified: Secondary | ICD-10-CM | POA: Insufficient documentation

## 2014-04-28 MED ORDER — PREDNISONE 5 MG PO TABS: 5.0000 mg | ORAL_TABLET | Freq: Two times a day (BID) | ORAL | Status: AC

## 2014-04-28 NOTE — Patient Instructions (Signed)
Annual physical exam May 1 or after  Fasting lipid, cmp and EGFR, hBA1C, CBC  amd microalb 4/30 or after  You are referred to Dr Merlene Laughter to be evaluated for sleep apnea, due to excess snoring and the fact that you stop breathing while asleep, impt to keep appt  You are referred to my eye Doctor for eye exam  Use tylenol, muscle rubs and stretching exercises for joint pains.  Prednisone sent in for 5 days only  Plan to STOP cigarettes by next May, you cAN do this!  Check you ins for Prevnar coverage (pneumonia vaccine)

## 2014-04-29 DIAGNOSIS — G4730 Sleep apnea, unspecified: Secondary | ICD-10-CM | POA: Insufficient documentation

## 2014-04-29 NOTE — Assessment & Plan Note (Signed)
Chronic fatigue, excessive snoring and h/o stopping breathing, needs eval for sleep apnea, referral entered

## 2014-04-29 NOTE — Assessment & Plan Note (Signed)
Patient advised to reduce carb and sweets, commit to regular physical activity, take meds as prescribed, test blood as directed, and attempt to lose weight, to improve blood sugar control. Controlled, no change in medication   

## 2014-04-29 NOTE — Assessment & Plan Note (Signed)
Controlled, no change in medication DASH diet and commitment to daily physical activity for a minimum of 30 minutes discussed and encouraged, as a part of hypertension management. The importance of attaining a healthy weight is also discussed.  

## 2014-04-29 NOTE — Progress Notes (Signed)
   Subjective:    Patient ID: Jaime Murray, female    DOB: 1955/09/06, 58 y.o.   MRN: [id number]  HPI The PT is here for follow up and re-evaluation of chronic medical conditions, medication management and review of any available recent lab and radiology data.  Preventive health is updated, specifically  Cancer screening and Immunization.   . The PT denies any adverse reactions to current medications since the last visit.  Questions about meds for generalized arthritic pains  C/o chronic fatigue and excessive daytime sleepiness, also has a h/o snoring excessively    Review of Systems See HPI Denies recent fever or chills. Denies sinus pressure, nasal congestion, ear pain or sore throat. Denies chest congestion, productive cough or wheezing. Denies chest pains, palpitations and leg swelling Denies abdominal pain, nausea, vomiting,diarrhea or constipation.   Denies dysuria, frequency, hesitancy or incontinence.  Denies headaches, seizures, numbness, or tingling. Denies depression, anxiety or insomnia. Denies skin break down or rash.        Objective:   Physical Exam BP 120/80  Pulse 87  Resp 16  Ht 4\' 11"  (1.499 m)  Wt 148 lb (67.132 kg)  BMI 29.88 kg/m2  SpO2 97% Patient alert and oriented and in no cardiopulmonary distress.  HEENT: No facial asymmetry, EOMI,   oropharynx pink and moist.  Neck supple no JVD, no mass.  Chest: Clear to auscultation bilaterally.  CVS: S1, S2 no murmurs, no S3.Regular rate.  ABD: Soft non tender.   Ext: No edema  MS: Adequate ROM spine, shoulders, hips and knees.  Skin: Intact, no ulcerations or rash noted.  Psych: Good eye contact, normal affect. Memory intact not anxious or depressed appearing.  CNS: CN 2-12 intact, power,  normal throughout.no focal deficits noted.        Assessment & Plan:  Hypertension goal BP (blood pressure) < 130/80 Controlled, no change in medication DASH diet and commitment to daily physical  activity for a minimum of 30 minutes discussed and encouraged, as a part of hypertension management. The importance of attaining a healthy weight is also discussed.   DM (diabetes mellitus) Patient advised to reduce carb and sweets, commit to regular physical activity, take meds as prescribed, test blood as directed, and attempt to lose weight, to improve blood sugar control. Controlled, no change in medication   Generalized osteoarthritis Short course prednisone, topical rub and stretches and tylenol  Hyperlipidemia Elevated Tg Hyperlipidemia:Low fat diet discussed and encouraged.  Updated lab needed at/ before next visit.   NICOTINE ADDICTION Improving, no quit  Date set Patient counseled for approximately 5 minutes regarding the health risks of ongoing nicotine use, specifically all types of cancer, heart disease, stroke and respiratory failure. The options available for help with cessation ,the behavioral changes to assist the process, and the option to either gradully reduce usage  Or abruptly stop.is also discussed. Pt is also encouraged to set specific goals in number of cigarettes used daily, as well as to set a quit date.   Sleep disorder breathing Chronic fatigue, excessive snoring and h/o stopping breathing, needs eval for sleep apnea, referral entered

## 2014-04-29 NOTE — Assessment & Plan Note (Signed)
Improving, no quit  Date set Patient counseled for approximately 5 minutes regarding the health risks of ongoing nicotine use, specifically all types of cancer, heart disease, stroke and respiratory failure. The options available for help with cessation ,the behavioral changes to assist the process, and the option to either gradully reduce usage  Or abruptly stop.is also discussed. Pt is also encouraged to set specific goals in number of cigarettes used daily, as well as to set a quit date.

## 2014-04-29 NOTE — Assessment & Plan Note (Signed)
Short course prednisone, topical rub and stretches and tylenol

## 2014-04-29 NOTE — Assessment & Plan Note (Signed)
Elevated Tg  Hyperlipidemia:Low fat diet discussed and encouraged.  Updated lab needed at/ before next visit.  

## 2014-05-01 ENCOUNTER — Telehealth: Payer: Self-pay | Admitting: Family Medicine

## 2014-05-01 NOTE — Telephone Encounter (Signed)
Pls call pt, let her know that after thinking about it some more, I do blieve that  a topical anti inflammatory pain med , like the one she was asking for from [facility], if used sparingly should not put her at significant  increased risk , so will send in for her, she wanted one of the [facility] formulations, she will need to give the name of the specific one she wanted , unless she states "anyone of them " will do, pls ask her to provide the name if she wants to puruse this still, I will sign a script

## 2014-05-02 NOTE — Telephone Encounter (Signed)
done

## 2014-05-02 NOTE — Telephone Encounter (Signed)
Form in box for signature

## 2014-05-05 ENCOUNTER — Other Ambulatory Visit: Payer: Self-pay | Admitting: Family Medicine

## 2014-05-25 ENCOUNTER — Other Ambulatory Visit: Payer: Self-pay | Admitting: Family Medicine

## 2014-07-01 LAB — HM DIABETES EYE EXAM

## 2014-07-16 ENCOUNTER — Other Ambulatory Visit: Payer: Self-pay | Admitting: Family Medicine

## 2014-08-05 ENCOUNTER — Other Ambulatory Visit: Payer: Self-pay | Admitting: Family Medicine

## 2014-09-10 ENCOUNTER — Other Ambulatory Visit: Payer: Self-pay | Admitting: Family Medicine

## 2014-09-28 ENCOUNTER — Telehealth: Payer: Self-pay

## 2014-09-28 NOTE — Telephone Encounter (Signed)
Patient given appointment to come in on 3/10

## 2014-09-28 NOTE — Telephone Encounter (Signed)
agree

## 2014-09-29 ENCOUNTER — Ambulatory Visit (INDEPENDENT_AMBULATORY_CARE_PROVIDER_SITE_OTHER): Admitting: Family Medicine

## 2014-09-29 ENCOUNTER — Encounter: Payer: Self-pay | Admitting: Family Medicine

## 2014-09-29 VITALS — BP 122/80 | HR 80 | Resp 16 | Ht 59.0 in | Wt 153.4 lb

## 2014-09-29 DIAGNOSIS — I10 Essential (primary) hypertension: Secondary | ICD-10-CM

## 2014-09-29 DIAGNOSIS — R51 Headache: Secondary | ICD-10-CM

## 2014-09-29 DIAGNOSIS — R519 Headache, unspecified: Secondary | ICD-10-CM | POA: Insufficient documentation

## 2014-09-29 DIAGNOSIS — J309 Allergic rhinitis, unspecified: Secondary | ICD-10-CM

## 2014-09-29 MED ORDER — KETOROLAC TROMETHAMINE 60 MG/2ML IM SOLN
60.0000 mg | Freq: Once | INTRAMUSCULAR | Status: AC
  Administered 2014-09-29: 60 mg via INTRAMUSCULAR

## 2014-09-29 MED ORDER — METHYLPREDNISOLONE ACETATE 80 MG/ML IJ SUSP
80.0000 mg | Freq: Once | INTRAMUSCULAR | Status: AC
  Administered 2014-09-29: 80 mg via INTRAMUSCULAR

## 2014-09-29 MED ORDER — FLUTICASONE PROPIONATE 50 MCG/ACT NA SUSP: 2.0000 | Freq: Every day | NASAL | Status: DC

## 2014-09-29 MED ORDER — PREDNISONE 5 MG PO TABS: 5.0000 mg | ORAL_TABLET | Freq: Two times a day (BID) | ORAL | Status: DC

## 2014-09-29 MED ORDER — CLONIDINE HCL 0.1 MG PO TABS: 0.1000 mg | ORAL_TABLET | Freq: Every day | ORAL | Status: DC

## 2014-09-29 MED ORDER — SERTRALINE HCL 25 MG PO TABS: 25.0000 mg | ORAL_TABLET | Freq: Every day | ORAL | Status: DC

## 2014-09-29 MED ORDER — OMEPRAZOLE 20 MG PO CPDR: 20.0000 mg | DELAYED_RELEASE_CAPSULE | Freq: Every day | ORAL | Status: DC

## 2014-09-29 MED ORDER — PREDNISONE 5 MG PO TABS: 5.0000 mg | ORAL_TABLET | Freq: Two times a day (BID) | ORAL | Status: AC

## 2014-09-29 MED ORDER — CETIRIZINE HCL 10 MG PO TABS: 10.0000 mg | ORAL_TABLET | Freq: Every day | ORAL | Status: DC

## 2014-09-29 NOTE — Progress Notes (Signed)
   Subjective:    Patient ID: Jaime Murray, female    DOB: 17-Jun-1956, 59 y.o.   MRN: [id number]  HPI 2 day h/o left periorbital headache, had ringing in right ear the week before, now c/o reduced vision in left ey since headache , like a glaze across the eye. Headache relieved from a 10 to a 4 with allergy tab only one, denies fever, chills , or cough Denies polyuria, polydipsia, blurred vision , or hypoglycemic episodes. No commitment to nicotine cessation, and February was very stressful for her as she buried her Dad Denies polyuria, polydipsia, blurred vision , or hypoglycemic episodes.       Review of Systems See HPI Denies recent fever or chills.  Denies chest congestion, productive cough or wheezing. Denies chest pains, palpitations and leg swelling Denies abdominal pain, nausea, vomiting,diarrhea or constipation.   Denies joint pain, swelling and limitation in mobility. Denies  seizures, numbness, or tingling.      Objective:   Physical Exam  BP 122/80 mmHg  Pulse 80  Resp 16  Ht 4\' 11"  (1.499 m)  Wt 153 lb 6.4 oz (69.582 kg)  BMI 30.97 kg/m2  SpO2 97% Patient alert and oriented and in no cardiopulmonary distress.  HEENT: No facial asymmetry, EOMI,   oropharynx pink and moist.  Neck supple no JVD, no mass. No sinus tenderness,  Chest: Clear to auscultation bilaterally.  CVS: S1, S2 no murmurs, no S3.Regular rate.  ABD: Soft non tender.   Ext: No edema  Psych: Good eye contact, slightly sad affect. Memory intact not anxious or depressed appearing.  CNS: CN 2-12 intact, power,  normal throughout.no focal deficits noted.       Assessment & Plan:  Periorbital headache Acute onset, normal neurologic exam appears to be primarily related to uncontrolled allergies Anti inflammatory injections in office folowed by short steroid course, pt to call if symptoms worsen    Hypertension goal BP (blood pressure) < 130/80 Controlled, no change in  medication DASH diet and commitment to daily physical activity for a minimum of 30 minutes discussed and encouraged, as a part of hypertension management. The importance of attaining a healthy weight is also discussed.    Allergic rhinitis Uncontrolled, pt to resume daily medication, seroidin office and short course orally

## 2014-09-29 NOTE — Patient Instructions (Signed)
F/u as before  Injections in office and medication sent to local pharmacy for headache and allergies  Need to commit to daily allergy medication  Call if not better in next 3 days or if worsens go to ED  Vision is good at this time  Condolence at your recent loss  Thanks for choosing [facility], we consider it a privelige to serve you.

## 2014-10-01 NOTE — Assessment & Plan Note (Signed)
Uncontrolled, pt to resume daily medication, seroidin office and short course orally

## 2014-10-01 NOTE — Assessment & Plan Note (Signed)
Controlled, no change in medication DASH diet and commitment to daily physical activity for a minimum of 30 minutes discussed and encouraged, as a part of hypertension management. The importance of attaining a healthy weight is also discussed.  

## 2014-10-01 NOTE — Assessment & Plan Note (Signed)
Acute onset, normal neurologic exam appears to be primarily related to uncontrolled allergies Anti inflammatory injections in office folowed by short steroid course, pt to call if symptoms worsen

## 2014-11-23 ENCOUNTER — Ambulatory Visit (INDEPENDENT_AMBULATORY_CARE_PROVIDER_SITE_OTHER): Admitting: Family Medicine

## 2014-11-23 ENCOUNTER — Other Ambulatory Visit: Payer: Self-pay | Admitting: Family Medicine

## 2014-11-23 ENCOUNTER — Encounter: Payer: Self-pay | Admitting: Family Medicine

## 2014-11-23 ENCOUNTER — Other Ambulatory Visit (HOSPITAL_COMMUNITY)
Admission: RE | Admit: 2014-11-23 | Discharge: 2014-11-23 | Disposition: A | Discharge status: Home / Self Care | Source: Ambulatory Visit | Attending: Family Medicine | Admitting: Family Medicine

## 2014-11-23 VITALS — BP 122/80 | HR 90 | Resp 16 | Ht 62.0 in | Wt 150.0 lb

## 2014-11-23 DIAGNOSIS — R079 Chest pain, unspecified: Secondary | ICD-10-CM | POA: Diagnosis not present

## 2014-11-23 DIAGNOSIS — Z720 Tobacco use: Secondary | ICD-10-CM

## 2014-11-23 DIAGNOSIS — Z23 Encounter for immunization: Secondary | ICD-10-CM

## 2014-11-23 DIAGNOSIS — Z1211 Encounter for screening for malignant neoplasm of colon: Secondary | ICD-10-CM | POA: Diagnosis not present

## 2014-11-23 DIAGNOSIS — Z0000 Encounter for general adult medical examination without abnormal findings: Secondary | ICD-10-CM | POA: Diagnosis not present

## 2014-11-23 DIAGNOSIS — E785 Hyperlipidemia, unspecified: Secondary | ICD-10-CM

## 2014-11-23 DIAGNOSIS — Z01419 Encounter for gynecological examination (general) (routine) without abnormal findings: Secondary | ICD-10-CM | POA: Insufficient documentation

## 2014-11-23 DIAGNOSIS — D509 Iron deficiency anemia, unspecified: Secondary | ICD-10-CM

## 2014-11-23 DIAGNOSIS — R072 Precordial pain: Secondary | ICD-10-CM

## 2014-11-23 DIAGNOSIS — E119 Type 2 diabetes mellitus without complications: Secondary | ICD-10-CM

## 2014-11-23 DIAGNOSIS — Z1231 Encounter for screening mammogram for malignant neoplasm of breast: Secondary | ICD-10-CM

## 2014-11-23 DIAGNOSIS — Z124 Encounter for screening for malignant neoplasm of cervix: Secondary | ICD-10-CM

## 2014-11-23 DIAGNOSIS — F17200 Nicotine dependence, unspecified, uncomplicated: Secondary | ICD-10-CM

## 2014-11-23 DIAGNOSIS — I10 Essential (primary) hypertension: Secondary | ICD-10-CM

## 2014-11-23 LAB — COMPLETE METABOLIC PANEL WITH GFR
ALT: 17 U/L (ref 0–35)
AST: 19 U/L (ref 0–37)
Albumin: 3.8 g/dL (ref 3.5–5.2)
Alkaline Phosphatase: 72 U/L (ref 39–117)
BILIRUBIN TOTAL: 0.5 mg/dL (ref 0.2–1.2)
BUN: 5 mg/dL — ABNORMAL LOW (ref 6–23)
CO2: 30 mEq/L (ref 19–32)
CREATININE: 0.54 mg/dL (ref 0.50–1.10)
Calcium: 9.3 mg/dL (ref 8.4–10.5)
Chloride: 98 mEq/L (ref 96–112)
Glucose, Bld: 112 mg/dL — ABNORMAL HIGH (ref 70–99)
Potassium: 3.3 mEq/L — ABNORMAL LOW (ref 3.5–5.3)
Sodium: 138 mEq/L (ref 135–145)
Total Protein: 6.7 g/dL (ref 6.0–8.3)

## 2014-11-23 LAB — HEMOGLOBIN A1C
Hgb A1c MFr Bld: 6.7 % — ABNORMAL HIGH (ref <5.7)
MEAN PLASMA GLUCOSE: 146 mg/dL — AB (ref <117)

## 2014-11-23 LAB — CBC WITH DIFFERENTIAL/PLATELET
BASOS ABS: 0 10*3/uL (ref 0.0–0.1)
Basophils Relative: 0 % (ref 0–1)
Eosinophils Absolute: 0.3 10*3/uL (ref 0.0–0.7)
Eosinophils Relative: 3 % (ref 0–5)
HEMATOCRIT: 38.2 % (ref 36.0–46.0)
Hemoglobin: 12.9 g/dL (ref 12.0–15.0)
LYMPHS PCT: 30 % (ref 12–46)
Lymphs Abs: 2.9 10*3/uL (ref 0.7–4.0)
MCH: 27.2 pg (ref 26.0–34.0)
MCHC: 33.8 g/dL (ref 30.0–36.0)
MCV: 80.6 fL (ref 78.0–100.0)
MONO ABS: 0.4 10*3/uL (ref 0.1–1.0)
MONOS PCT: 4 % (ref 3–12)
MPV: 12.6 fL — ABNORMAL HIGH (ref 8.6–12.4)
NEUTROS PCT: 63 % (ref 43–77)
Neutro Abs: 6 10*3/uL (ref 1.7–7.7)
Platelets: 294 10*3/uL (ref 150–400)
RBC: 4.74 MIL/uL (ref 3.87–5.11)
RDW: 14.2 % (ref 11.5–15.5)
WBC: 9.5 10*3/uL (ref 4.0–10.5)

## 2014-11-23 LAB — LIPID PANEL
Cholesterol: 151 mg/dL (ref 0–200)
HDL: 45 mg/dL — ABNORMAL LOW (ref >=46)
LDL Cholesterol: 83 mg/dL (ref 0–99)
Total CHOL/HDL Ratio: 3.4 Ratio
Triglycerides: 115 mg/dL (ref <150)
VLDL: 23 mg/dL (ref 0–40)

## 2014-11-23 LAB — POC HEMOCCULT BLD/STL (OFFICE/1-CARD/DIAGNOSTIC): FECAL OCCULT BLD: NEGATIVE

## 2014-11-23 NOTE — Progress Notes (Signed)
Subjective:    Patient ID: Jaime Murray, female    DOB: 1956/06/05, 59 y.o.   MRN: [id number]  HPI Patient is in for annual physical exam. Immunization is reviewed , and  updated if needed. Ongoing nicotine use, 7 per day, no intent to quit as yet, counseling done and resources provided Intermittent left chest pain x 6 months and increased exertional fatigue with sweating , needs  Cardiology f/u. No pain at the time of the visit   Review of Systems See HPI     Objective:   Physical Exam BP 122/80 mmHg  Pulse 90  Resp 16  Ht 5\' 2"  (1.575 m)  Wt 150 lb (68.04 kg)  BMI 27.43 kg/m2  SpO2 96% Pleasant well nourished female, alert and oriented x 3, in no cardio-pulmonary distress. Afebrile. HEENT No facial trauma or asymetry. Sinuses non tender.  Extra occullar muscles intact, pupils equally reactive to light. External ears normal, tympanic membranes clear. Oropharynx moist, no exudate, poor  dentition. Neck: supple, no adenopathy,JVD or thyromegaly.No bruits.  Chest: Clear to ascultation bilaterally.No crackles or wheezes.No reproducible chest wall pain Non tender to palpation  Breast: No asymetry,no masses or lumps. No tenderness. No nipple discharge or inversion. No axillary or supraclavicular adenopathy  Cardiovascular system; Heart sounds normal,  S1 and  S2 ,no S3.  No murmur, or thrill.No reproducible chest pain Apical beat not displaced Peripheral pulses normal.  Abdomen: Soft, non tender, no organomegaly or masses. No bruits. Bowel sounds normal. No guarding, tenderness or rebound.  Rectal:  Normal sphincter tone. No mass.No rectal masses.  Guaiac negative stool.  GU: External genitalia normal female genitalia , female distribution of hair. No lesions. Urethral meatus normal in size, no  Prolapse, no lesions visibly  Present. Bladder non tender. Vagina pink and moist , with no visible lesions , discharge present . Adequate pelvic support no   cystocele or rectocele noted Cervix pink and appears healthy, no lesions or ulcerations noted, no discharge noted from os Uterus normal size, no adnexal masses, no cervical motion or adnexal tenderness.   Musculoskeletal exam: Full ROM of spine, hips , shoulders and knees. No deformity ,swelling or crepitus noted. No muscle wasting or atrophy.   Neurologic: Cranial nerves 2 to 12 intact. Power, tone ,sensation and reflexes normal throughout. No disturbance in gait. No tremor.  Skin: Intact, no ulceration, erythema , scaling or rash noted. Pigmentation normal throughout  Psych; Normal mood and affect. Judgement and concentration normal        Assessment & Plan:  Annual physical exam Annual exam as documented. Counseling done  re healthy lifestyle involving commitment to 150 minutes exercise per week, heart healthy diet, and attaining healthy weight.The importance of adequate sleep also discussed. Regular seat belt use and home safety, is also discussed. Changes in health habits are decided on by the patient with goals and time frames  set for achieving them. Immunization and cancer screening needs are specifically addressed at this visit.    Need for vaccination with 13-polyvalent pneumococcal conjugate vaccine After obtaining informed consent, the vaccine is  administered by LPN.    NICOTINE ADDICTION Patient counseled for approximately 5 minutes regarding the health risks of ongoing nicotine use, specifically all types of cancer, heart disease, stroke and respiratory failure. The options available for help with cessation ,the behavioral changes to assist the process, and the option to either gradully reduce usage  Or abruptly stop.is also discussed. Pt is also encouraged to set specific  goals in number of cigarettes used daily, as well as to set a quit date.  Number of cigarettes/cigars currently smoking daily: 7    Exertional chest pain Reports in creased left  chest discomfort and exertional fatigue in past 4 months. Multiple CV risk factors present , neeeds to f/u with cardiology

## 2014-11-23 NOTE — Assessment & Plan Note (Signed)

## 2014-11-23 NOTE — Assessment & Plan Note (Signed)
After obtaining informed consent, the vaccine is  administered by LPN.  

## 2014-11-23 NOTE — Assessment & Plan Note (Signed)

## 2014-11-23 NOTE — Patient Instructions (Addendum)
F/u in 5.5 month, call if you need me before  You are referred to cardiology.  You are referred for screening mammogram  Prevnar today  Work on quitting smoking, I know you are thinking about this  It is important that you exercise regularly at least 30 minutes 5 times a week. If you develop chest pain, have severe difficulty breathing, or feel very tired, stop exercising immediately and seek medical attention   Labs today please  Thanks for choosing [facility], we consider it a privelige to serve you.

## 2014-11-24 LAB — CYTOLOGY - PAP

## 2014-11-25 MED ORDER — POTASSIUM CHLORIDE CRYS ER 20 MEQ PO TBCR: 20.0000 meq | EXTENDED_RELEASE_TABLET | Freq: Two times a day (BID) | ORAL | Status: DC

## 2014-11-26 DIAGNOSIS — R079 Chest pain, unspecified: Secondary | ICD-10-CM | POA: Insufficient documentation

## 2014-11-26 NOTE — Assessment & Plan Note (Signed)
Reports in creased left chest discomfort and exertional fatigue in past 4 months. Multiple CV risk factors present , neeeds to f/u with cardiology

## 2014-11-29 ENCOUNTER — Ambulatory Visit: Admitting: Cardiology

## 2014-12-21 ENCOUNTER — Ambulatory Visit (HOSPITAL_COMMUNITY)

## 2014-12-25 ENCOUNTER — Other Ambulatory Visit: Payer: Self-pay | Admitting: Family Medicine

## 2015-01-03 ENCOUNTER — Encounter: Payer: Self-pay | Admitting: Internal Medicine

## 2015-01-12 ENCOUNTER — Telehealth: Payer: Self-pay

## 2015-01-12 NOTE — Telephone Encounter (Signed)
Patient aware and letter given to that effect

## 2015-01-12 NOTE — Telephone Encounter (Signed)
Needs to have fleas eliminated from her environment, use benadryl at night to help with itch, avoid scratching if the get painful red or pus drains then this is infected skin which is a complication, not due to the fleas  BOTTOM LINE, needs to get rid of fleas

## 2015-03-25 ENCOUNTER — Other Ambulatory Visit: Payer: Self-pay | Admitting: Family Medicine

## 2015-03-31 ENCOUNTER — Other Ambulatory Visit: Payer: Self-pay | Admitting: Family Medicine

## 2015-04-19 ENCOUNTER — Ambulatory Visit (INDEPENDENT_AMBULATORY_CARE_PROVIDER_SITE_OTHER)

## 2015-04-19 DIAGNOSIS — Z23 Encounter for immunization: Secondary | ICD-10-CM | POA: Diagnosis not present

## 2015-05-04 ENCOUNTER — Other Ambulatory Visit: Payer: Self-pay | Admitting: Family Medicine

## 2015-05-08 ENCOUNTER — Other Ambulatory Visit: Payer: Self-pay | Admitting: Family Medicine

## 2015-05-16 ENCOUNTER — Ambulatory Visit (INDEPENDENT_AMBULATORY_CARE_PROVIDER_SITE_OTHER): Admitting: Family Medicine

## 2015-05-16 ENCOUNTER — Encounter: Payer: Self-pay | Admitting: Family Medicine

## 2015-05-16 VITALS — BP 120/80 | HR 93 | Resp 16 | Ht 62.0 in | Wt 147.4 lb

## 2015-05-16 DIAGNOSIS — E119 Type 2 diabetes mellitus without complications: Secondary | ICD-10-CM

## 2015-05-16 DIAGNOSIS — E785 Hyperlipidemia, unspecified: Secondary | ICD-10-CM

## 2015-05-16 DIAGNOSIS — I10 Essential (primary) hypertension: Secondary | ICD-10-CM

## 2015-05-16 DIAGNOSIS — L309 Dermatitis, unspecified: Secondary | ICD-10-CM | POA: Insufficient documentation

## 2015-05-16 DIAGNOSIS — D509 Iron deficiency anemia, unspecified: Secondary | ICD-10-CM | POA: Diagnosis not present

## 2015-05-16 DIAGNOSIS — F17200 Nicotine dependence, unspecified, uncomplicated: Secondary | ICD-10-CM

## 2015-05-16 DIAGNOSIS — Z1211 Encounter for screening for malignant neoplasm of colon: Secondary | ICD-10-CM

## 2015-05-16 LAB — BASIC METABOLIC PANEL WITH GFR
BUN: 5 mg/dL — AB (ref 7–25)
CHLORIDE: 101 mmol/L (ref 98–110)
CO2: 26 mmol/L (ref 20–31)
Calcium: 9.3 mg/dL (ref 8.6–10.4)
Creat: 0.59 mg/dL (ref 0.50–1.05)
GFR, Est African American: >89 mL/min (ref >=60)
Glucose, Bld: 133 mg/dL — ABNORMAL HIGH (ref 65–99)
POTASSIUM: 3.8 mmol/L (ref 3.5–5.3)
Sodium: 138 mmol/L (ref 135–146)

## 2015-05-16 LAB — TSH: TSH: 1.983 u[IU]/mL (ref 0.350–4.500)

## 2015-05-16 MED ORDER — HYDROXYZINE HCL 50 MG PO TABS: ORAL_TABLET | ORAL | Status: DC

## 2015-05-16 MED ORDER — BENAZEPRIL HCL 5 MG PO TABS
5.0000 mg | ORAL_TABLET | Freq: Every day | ORAL | Status: DC
Start: 2015-05-16 — End: 2016-08-06

## 2015-05-16 MED ORDER — PREDNISONE 5 MG PO TABS: 5.0000 mg | ORAL_TABLET | Freq: Two times a day (BID) | ORAL | Status: AC

## 2015-05-16 NOTE — Patient Instructions (Addendum)
F/u in 5 month, call if you need me before  Labs today, HBA1C, chem 7 and EGFr and hBA1C and TSH  Cigarettes down to 2 per day during the week and 5 per day on weekends is the new goal  You NEED TO QUIT   Microalb from office  Fasting lipid, cmp and EGFR and hBA1C in 5 months before visit, 1 week pls  Two medications sent for itch, you are no longer infected  PLS sched mammogram  You are referred to Dr Gala Romney for colonoscopy Please work on good  health habits so that your health will improve. 1. Commitment to daily physical activity for 30 to 60  minutes, if you are able to do this.  2. Commitment to wise food choices. Aim for half of your  food intake to be vegetable and fruit, one quarter starchy foods, and one quarter protein. Try to eat on a regular schedule  3 meals per day, snacking between meals should be limited to vegetables or fruits or small portions of nuts. 64 ounces of water per day is generally recommended, unless you have specific health conditions, like heart failure or kidney failure where you will need to limit fluid intake.  3. Commitment to sufficient and a  good quality of physical and mental rest daily, generally between 6 to 8 hours per day.  WITH PERSISTANCE AND PERSEVERANCE, THE IMPOSSIBLE , BECOMES THE NORM!  Thanks for choosing [facility] we consider it a privelige to serve you.  New is benazepril 5 mg daily for kidney protection

## 2015-05-16 NOTE — Progress Notes (Signed)
Subjective:    Patient ID: Jaime Murray, female    DOB: 04/04/1956, 59 y.o.   MRN: [id number]  HPI  The PT is here for follow up and re-evaluation of chronic medical conditions, medication management and review of any available recent lab and radiology data.  Preventive health is updated, specifically  Cancer screening and Immunization.   Questions or concerns regarding consultations or procedures which the PT has had in the interim are  addressed. The PT denies any adverse reactions to current medications since the last visit.  Itching x 5 months, after she realized she was exposed to bed bugs abnd fleas, no pus, or fever Denies polyuria, polydipsia, blurred vision , or hypoglycemic episodes.     Review of Systems See HPI Denies recent fever or chills. Denies sinus pressure, nasal congestion, ear pain or sore throat. Denies chest congestion, productive cough or wheezing. Denies chest pains, palpitations and leg swelling Denies abdominal pain, nausea, vomiting,diarrhea or constipation.   Denies dysuria, frequency, hesitancy or incontinence. Denies joint pain, swelling and limitation in mobility. Denies headaches, seizures, numbness, or tingling. Denies depression, anxiety or insomnia.        Objective:   Physical Exam BP 120/80 mmHg  Pulse 93  Resp 16  Ht 5\' 2"  (1.575 m)  Wt 147 lb 6.4 oz (66.86 kg)  BMI 26.95 kg/m2  SpO2 96% Patient alert and oriented and in no cardiopulmonary distress.  HEENT: No facial asymmetry, EOMI,   oropharynx pink and moist.  Neck supple no JVD, no mass.  Chest: Clear to auscultation bilaterally.  CVS: S1, S2 no murmurs, no S3.Regular rate.  ABD: Soft non tender.   Ext: No edema  MS: Adequate ROM spine, shoulders, hips and knees.  Skin: Intact, healed scars in places where pt has been scratching and also potentially bitten. No eryhtema , drainage or warmth Psych: Good eye contact, normal affect. Memory intact not anxious or  depressed appearing.  CNS: CN 2-12 intact, power,  normal throughout.no focal deficits noted.       Assessment & Plan:  Hypertension goal BP (blood pressure) < 130/80 Controlled, no change in medication DASH diet and commitment to daily physical activity for a minimum of 30 minutes discussed and encouraged, as a part of hypertension management. The importance of attaining a healthy weight is also discussed.  BP/Weight 05/16/2015 11/23/2014 09/29/2014 04/28/2014 12/29/2013 12/01/2013 12/07/8414  Systolic BP 606 301 601 093 235 573 220  Diastolic BP 80 80 80 80 78 60 88  Wt. (Lbs) 147.4 150 153.4 148 145 142 148.12  BMI 26.95 27.43 30.97 29.88 29.27 28.67 27.08        Hyperlipidemia Controlled, no change in medication Hyperlipidemia:Low fat diet discussed and encouraged.   Lipid Panel  Lab Results  Component Value Date   CHOL 151 11/23/2014   HDL 45* 11/23/2014   LDLCALC 83 11/23/2014   TRIG 115 11/23/2014   CHOLHDL 3.4 11/23/2014   Need  To commit to daily exercise     Dermatitis 4 month h/o itchy rash affecting upper neck and shouldrrs and arms, was exposed bed bugs and fleas, stilll itches every time she hinks of this. No superinfection noted  DM (diabetes mellitus) Controlled, no change in medication Jaime Murray is reminded of the importance of commitment to daily physical activity for 30 minutes or more, as able and the need to limit carbohydrate intake to 30 to 60 grams per meal to help with blood sugar control.   The  need to take medication as prescribed, test blood sugar as directed, and to call between visits if there is a concern that blood sugar is uncontrolled is also discussed.   Jaime Murray is reminded of the importance of daily foot exam, annual eye examination, and good blood sugar, blood pressure and cholesterol control.  Diabetic Labs Latest Ref Rng 05/16/2015 11/23/2014 04/21/2014 11/18/2013 11/17/2013  HbA1c <5.7 % 6.5(H) 6.7(H) 6.7(H) 6.6(H) -    Microalbumin Not estab mg/dL 1.1 - - - 1.60  Micro/Creat Ratio <30 mcg/mg creat 5 - - - 4.1  Chol 0 - 200 mg/dL - 151 - 176 -  HDL >=46 mg/dL - 45(L) - 48 -  Calc LDL 0 - 99 mg/dL - 83 - 95 -  Triglycerides <150 mg/dL - 115 - 167(H) -  Creatinine 0.50 - 1.05 mg/dL 0.59 0.54 0.61 0.55 -   BP/Weight 05/16/2015 11/23/2014 09/29/2014 04/28/2014 12/29/2013 12/01/2013 02/13/2034  Systolic BP 597 416 384 536 468 032 122  Diastolic BP 80 80 80 80 78 60 88  Wt. (Lbs) 147.4 150 153.4 148 145 142 148.12  BMI 26.95 27.43 30.97 29.88 29.27 28.67 27.08   Foot/eye exam completion dates Latest Ref Rng 11/23/2014 07/01/2014  Eye Exam No Retinopathy - No Retinopathy  Foot Form Completion - Done -         ADENOMATOUS COLONIC POLYP Colonoscopy past due , pt encouraged to do same, referral entered

## 2015-05-16 NOTE — Assessment & Plan Note (Signed)
Controlled, no change in medication Hyperlipidemia:Low fat diet discussed and encouraged.   Lipid Panel  Lab Results  Component Value Date   CHOL 151 11/23/2014   HDL 45* 11/23/2014   LDLCALC 83 11/23/2014   TRIG 115 11/23/2014   CHOLHDL 3.4 11/23/2014   Need  To commit to daily exercise

## 2015-05-16 NOTE — Assessment & Plan Note (Signed)
Controlled, no change in medication DASH diet and commitment to daily physical activity for a minimum of 30 minutes discussed and encouraged, as a part of hypertension management. The importance of attaining a healthy weight is also discussed.  BP/Weight 05/16/2015 11/23/2014 09/29/2014 04/28/2014 12/29/2013 12/01/2013 6/72/0919  Systolic BP 802 217 981 025 486 282 417  Diastolic BP 80 80 80 80 78 60 88  Wt. (Lbs) 147.4 150 153.4 148 145 142 148.12  BMI 26.95 27.43 30.97 29.88 29.27 28.67 27.08

## 2015-05-16 NOTE — Assessment & Plan Note (Signed)
4 month h/o itchy rash affecting upper neck and shouldrrs and arms, was exposed bed bugs and fleas, stilll itches every time she hinks of this. No superinfection noted

## 2015-05-17 LAB — HEMOGLOBIN A1C
Hgb A1c MFr Bld: 6.5 % — ABNORMAL HIGH (ref <5.7)
Mean Plasma Glucose: 140 mg/dL — ABNORMAL HIGH (ref <117)

## 2015-05-17 LAB — MICROALBUMIN / CREATININE URINE RATIO
CREATININE, URINE: 202 mg/dL (ref 20–320)
Microalb Creat Ratio: 5 mcg/mg creat (ref <30)
Microalb, Ur: 1.1 mg/dL

## 2015-05-22 NOTE — Assessment & Plan Note (Signed)
Colonoscopy past due , pt encouraged to do same, referral entered

## 2015-05-22 NOTE — Assessment & Plan Note (Signed)
Controlled, no change in medication Jaime Murray is reminded of the importance of commitment to daily physical activity for 30 minutes or more, as able and the need to limit carbohydrate intake to 30 to 60 grams per meal to help with blood sugar control.   The need to take medication as prescribed, test blood sugar as directed, and to call between visits if there is a concern that blood sugar is uncontrolled is also discussed.   Jaime Murray is reminded of the importance of daily foot exam, annual eye examination, and good blood sugar, blood pressure and cholesterol control.  Diabetic Labs Latest Ref Rng 05/16/2015 11/23/2014 04/21/2014 11/18/2013 11/17/2013  HbA1c <5.7 % 6.5(H) 6.7(H) 6.7(H) 6.6(H) -  Microalbumin Not estab mg/dL 1.1 - - - 1.60  Micro/Creat Ratio <30 mcg/mg creat 5 - - - 4.1  Chol 0 - 200 mg/dL - 151 - 176 -  HDL >=46 mg/dL - 45(L) - 48 -  Calc LDL 0 - 99 mg/dL - 83 - 95 -  Triglycerides <150 mg/dL - 115 - 167(H) -  Creatinine 0.50 - 1.05 mg/dL 0.59 0.54 0.61 0.55 -   BP/Weight 05/16/2015 11/23/2014 09/29/2014 04/28/2014 12/29/2013 12/01/2013 09/02/2480  Systolic BP 500 370 488 891 694 503 888  Diastolic BP 80 80 80 80 78 60 88  Wt. (Lbs) 147.4 150 153.4 148 145 142 148.12  BMI 26.95 27.43 30.97 29.88 29.27 28.67 27.08   Foot/eye exam completion dates Latest Ref Rng 11/23/2014 07/01/2014  Eye Exam No Retinopathy - No Retinopathy  Foot Form Completion - Done -

## 2015-05-24 ENCOUNTER — Other Ambulatory Visit: Payer: Self-pay | Admitting: Family Medicine

## 2015-05-26 ENCOUNTER — Telehealth: Payer: Self-pay | Admitting: Family Medicine

## 2015-05-26 MED ORDER — ONDANSETRON HCL 4 MG PO TABS: 4.0000 mg | ORAL_TABLET | Freq: Every day | ORAL | Status: DC

## 2015-05-26 NOTE — Telephone Encounter (Signed)
Vomiting Yes.    zofran called in and patient is aware and will go to the ER if it doesn't get better   Recommended treatment Hydration is important Fluids small frequent amounts as tolerated Good hygiene reduces transmission among family members Review Brat diet  Zofran 4 mg 1 tablet daily as needed for nausea and vomiting no more than 6 tablets   DiarrheaNo.  Recommended treatment  Imodium OTC  Can also offer Lomotil 1 tablet 4 times daily as needed no more than 10 tablets Good hygiene reduces transmission among family members Review Brat Diet  If patient starts to feel light headed or not passing much urine or becoming dehydrated will need to go to emergency room for IV hydration  Please call office if symptoms worsen or do not improve after 2-3 days

## 2015-05-26 NOTE — Telephone Encounter (Signed)
Jaime Murray has been vomiting for the last 24 hrs cant keep anything down, please advise?

## 2015-06-27 ENCOUNTER — Other Ambulatory Visit: Payer: Self-pay | Admitting: Family Medicine

## 2015-07-31 ENCOUNTER — Other Ambulatory Visit: Payer: Self-pay | Admitting: Family Medicine

## 2015-09-13 ENCOUNTER — Encounter (HOSPITAL_COMMUNITY): Payer: Self-pay | Admitting: Emergency Medicine

## 2015-09-13 ENCOUNTER — Emergency Department (HOSPITAL_COMMUNITY)

## 2015-09-13 ENCOUNTER — Emergency Department (HOSPITAL_COMMUNITY)
Admission: EM | Admit: 2015-09-13 | Discharge: 2015-09-13 | Disposition: A | Discharge status: Home / Self Care | Attending: Emergency Medicine | Admitting: Emergency Medicine

## 2015-09-13 DIAGNOSIS — Z7982 Long term (current) use of aspirin: Secondary | ICD-10-CM | POA: Insufficient documentation

## 2015-09-13 DIAGNOSIS — K219 Gastro-esophageal reflux disease without esophagitis: Secondary | ICD-10-CM | POA: Insufficient documentation

## 2015-09-13 DIAGNOSIS — Z7984 Long term (current) use of oral hypoglycemic drugs: Secondary | ICD-10-CM | POA: Insufficient documentation

## 2015-09-13 DIAGNOSIS — E119 Type 2 diabetes mellitus without complications: Secondary | ICD-10-CM | POA: Diagnosis not present

## 2015-09-13 DIAGNOSIS — K5900 Constipation, unspecified: Secondary | ICD-10-CM | POA: Diagnosis not present

## 2015-09-13 DIAGNOSIS — F17210 Nicotine dependence, cigarettes, uncomplicated: Secondary | ICD-10-CM | POA: Diagnosis not present

## 2015-09-13 DIAGNOSIS — Z791 Long term (current) use of non-steroidal anti-inflammatories (NSAID): Secondary | ICD-10-CM | POA: Insufficient documentation

## 2015-09-13 DIAGNOSIS — E7800 Pure hypercholesterolemia, unspecified: Secondary | ICD-10-CM | POA: Insufficient documentation

## 2015-09-13 DIAGNOSIS — F419 Anxiety disorder, unspecified: Secondary | ICD-10-CM | POA: Insufficient documentation

## 2015-09-13 DIAGNOSIS — Z79899 Other long term (current) drug therapy: Secondary | ICD-10-CM | POA: Insufficient documentation

## 2015-09-13 DIAGNOSIS — F329 Major depressive disorder, single episode, unspecified: Secondary | ICD-10-CM | POA: Diagnosis not present

## 2015-09-13 DIAGNOSIS — D509 Iron deficiency anemia, unspecified: Secondary | ICD-10-CM | POA: Diagnosis not present

## 2015-09-13 DIAGNOSIS — R05 Cough: Secondary | ICD-10-CM | POA: Diagnosis present

## 2015-09-13 DIAGNOSIS — Z7951 Long term (current) use of inhaled steroids: Secondary | ICD-10-CM | POA: Diagnosis not present

## 2015-09-13 DIAGNOSIS — B349 Viral infection, unspecified: Secondary | ICD-10-CM

## 2015-09-13 DIAGNOSIS — I10 Essential (primary) hypertension: Secondary | ICD-10-CM | POA: Insufficient documentation

## 2015-09-13 LAB — CBG MONITORING, ED: GLUCOSE-CAPILLARY: 117 mg/dL — AB (ref 65–99)

## 2015-09-13 MED ORDER — ACETAMINOPHEN 500 MG PO TABS
1000.0000 mg | ORAL_TABLET | Freq: Once | ORAL | Status: AC
  Administered 2015-09-13: 1000 mg via ORAL
  Filled 2015-09-13: qty 2

## 2015-09-13 MED ORDER — IBUPROFEN 600 MG PO TABS: 600.0000 mg | ORAL_TABLET | Freq: Four times a day (QID) | ORAL | Status: DC | PRN

## 2015-09-13 MED ORDER — OSELTAMIVIR PHOSPHATE 75 MG PO CAPS: 75.0000 mg | ORAL_CAPSULE | Freq: Two times a day (BID) | ORAL | Status: DC

## 2015-09-13 NOTE — ED Notes (Addendum)
Pt c/o np cough with intermittent ha since Sunday. Reports worsening sx since Sunday with blurred vision today. nad noted.

## 2015-09-15 NOTE — ED Provider Notes (Signed)
CSN: [id number]     Arrival date & time 09/13/15  1636 History   First MD Initiated Contact with Patient 09/13/15 1729     Chief Complaint  Patient presents with  . Cough     (Consider location/radiation/quality/duration/timing/severity/associated sxs/prior Treatment) HPI  Jaime Murray is a 60 y.o. female who presents to the Emergency Department complaining of cough, nasal congestion, intermittent frontal headache, body aches for three days.  Fever onset on day of arrival with associated chills.  She has tried tylenol but not regularly.  She denies neck pain or stiffness, dysuria, shortness of breath or chest pain.  Cough has been non-productive.     Past Medical History  Diagnosis Date  . GERD (gastroesophageal reflux disease)   . Constipation   . Fatigue   . IDA (iron deficiency anemia)   . DM (diabetes mellitus) ([place])   . HTN (hypertension)   . Hypercholesteremia   . Anxiety   . Depression    Past Surgical History  Procedure Laterality Date  . Ganglion cyst excision  05/22/2012    Procedure: REMOVAL GANGLION OF WRIST;  Surgeon: Carole Civil, MD;  Location: [facility];  Service: Orthopedics;  Laterality: Left;   Family History  Problem Relation Age of Onset  . Heart disease    . Arthritis    . Hypertension Mother   . Cancer Mother     colon   . Hypertension Father   . Stroke Father    Social History  Substance Use Topics  . Smoking status: Current Every Day Smoker -- 0.25 packs/day for 30 years    Types: Cigarettes  . Smokeless tobacco: None  . Alcohol Use: 4.8 oz/week    8 Glasses of wine per week   OB History    No data available     Review of Systems  Constitutional: Positive for fever and chills. Negative for activity change and appetite change.  HENT: Positive for congestion, rhinorrhea and sore throat. Negative for facial swelling and trouble swallowing.   Eyes: Negative for visual disturbance.  Respiratory: Positive for cough. Negative for  chest tightness, shortness of breath, wheezing and stridor.   Gastrointestinal: Negative for nausea and vomiting.  Genitourinary: Negative for dysuria, flank pain and decreased urine volume.  Musculoskeletal: Positive for myalgias. Negative for neck pain and neck stiffness.  Skin: Negative.  Negative for rash.  Neurological: Negative for dizziness, weakness, numbness and headaches.  Hematological: Negative for adenopathy.  Psychiatric/Behavioral: Negative for confusion.  All other systems reviewed and are negative.     Allergies  Review of patient's allergies indicates no known allergies.  Home Medications   Prior to Admission medications   Medication Sig Start Date End Date Taking? Authorizing Provider  acetaminophen (TYLENOL) 325 MG tablet Take 650 mg by mouth every 6 (six) hours as needed. For pain    Historical Provider, MD  aspirin 81 MG tablet Take 81 mg by mouth daily.      Historical Provider, MD  benazepril (LOTENSIN) 5 MG tablet Take 1 tablet (5 mg total) by mouth daily. 05/16/15   Fayrene Helper, MD  cetirizine (ZYRTEC) 10 MG tablet Take 1 tablet (10 mg total) by mouth daily. 09/29/14   Fayrene Helper, MD  cloNIDine (CATAPRES) 0.1 MG tablet TAKE 1 TABLET AT BEDTIME 05/08/15   Fayrene Helper, MD  Diclofenac Sodium POWD APPLY 1 TO 2 PUMPS TO THE AFFECTED AREA(S) THREE TO FOUR TIMES DAILY. 05/24/15   Fayrene Helper, MD  ferrous sulfate dried (SLOW FE) 160 (50 FE) MG TBCR Take 160 mg by mouth daily.      Historical Provider, MD  fluticasone (FLONASE) 50 MCG/ACT nasal spray Place 2 sprays into both nostrils daily. 09/29/14   Fayrene Helper, MD  hydrochlorothiazide (HYDRODIURIL) 25 MG tablet TAKE 1 TABLET DAILY 03/28/15   Fayrene Helper, MD  hydrOXYzine (ATARAX/VISTARIL) 50 MG tablet One at bedtime as needed, for itch 05/16/15   Fayrene Helper, MD  ibuprofen (ADVIL,MOTRIN) 600 MG tablet Take 1 tablet (600 mg total) by mouth every 6 (six) hours as needed. Take  with meals 09/13/15   Arcangel Minion, PA-C  KLOR-CON M20 20 MEQ tablet TAKE 1 TABLET TWICE A DAY 08/01/15   Fayrene Helper, MD  lovastatin (MEVACOR) 40 MG tablet TAKE 1 TABLET AT BEDTIME 03/28/15   Fayrene Helper, MD  metFORMIN (GLUCOPHAGE) 500 MG tablet TAKE 1 TABLET DAILY WITH BREAKFAST 03/28/15   Fayrene Helper, MD  nitroGLYCERIN (NITROSTAT) 0.4 MG SL tablet Place 1 tablet (0.4 mg total) under the tongue every 5 (five) minutes x 3 doses as needed. For chest pain 11/18/13   Fayrene Helper, MD  Omega 3 1000 MG CAPS Take 1 capsule by mouth daily.    Historical Provider, MD  omeprazole (PRILOSEC) 20 MG capsule TAKE 1 CAPSULE DAILY 06/28/15   Fayrene Helper, MD  ondansetron (ZOFRAN) 4 MG tablet Take 1 tablet (4 mg total) by mouth daily. 05/26/15   Fayrene Helper, MD  oseltamivir (TAMIFLU) 75 MG capsule Take 1 capsule (75 mg total) by mouth 2 (two) times daily. For 5 days 09/13/15   Kem Parkinson, PA-C  Probiotic Product (PROBIOTIC DAILY) CAPS Take 1 capsule by mouth daily.    Historical Provider, MD  sertraline (ZOLOFT) 25 MG tablet TAKE 1 TABLET DAILY 05/24/15   Fayrene Helper, MD   BP 113/67 mmHg  Pulse 102  Temp(Src) 101.2 F (38.4 C) (Oral)  Resp 20  Ht 4\' 11"  (1.499 m)  Wt 63.504 kg  BMI 28.26 kg/m2  SpO2 96% Physical Exam  Constitutional: She is oriented to person, place, and time. She appears well-developed and well-nourished. No distress.  HENT:  Head: Normocephalic and atraumatic.  Right Ear: Tympanic membrane and ear canal normal.  Left Ear: Tympanic membrane and ear canal normal.  Nose: Mucosal edema and rhinorrhea present.  Mouth/Throat: Uvula is midline and mucous membranes are normal. No trismus in the jaw. No uvula swelling. Posterior oropharyngeal erythema present. No oropharyngeal exudate, posterior oropharyngeal edema or tonsillar abscesses.  Eyes: Conjunctivae and EOM are normal. Pupils are equal, round, and reactive to light.  Neck: Normal range of  motion and phonation normal. Neck supple. No Brudzinski's sign and no Kernig's sign noted.  Cardiovascular: Normal rate, regular rhythm, normal heart sounds and intact distal pulses.   No murmur heard. Pulmonary/Chest: Effort normal and breath sounds normal. No respiratory distress. She has no wheezes. She has no rales.  Abdominal: Soft. She exhibits no distension. There is no tenderness. There is no rebound and no guarding.  Musculoskeletal: She exhibits no edema.  Lymphadenopathy:    She has no cervical adenopathy.  Neurological: She is alert and oriented to person, place, and time. She exhibits normal muscle tone. Coordination normal.  Skin: Skin is warm and dry.  Nursing note and vitals reviewed.   ED Course  Procedures (including critical care time) Labs Review Labs Reviewed  CBG MONITORING, ED - Abnormal; Notable for the following:  Glucose-Capillary 117 (*)    All other components within normal limits    Imaging Review Dg Chest 2 View  09/13/2015  CLINICAL DATA:  Nonproductive cough, fever, body aches EXAM: CHEST  2 VIEW COMPARISON:  CT chest dated 11/25/2013 FINDINGS: Lungs are clear.  No pleural effusion or pneumothorax. The heart is normal in size. Moderate hiatal hernia. Degenerative changes of the visualized thoracolumbar spine. IMPRESSION: Normal chest radiographs. Electronically Signed   By: Julian Hy M.D.   On: 09/13/2015 17:03    I have personally reviewed and evaluated these images and lab results as part of my medical decision-making.   EKG Interpretation None      MDM   Final diagnoses:  Viral illness    Pt well appearing.  Fever improved after tylenol.  Sx's c/w flu like illness.  Pt is feeling better and appears stable for d/c,  Agrees to symptomatic tx and close PMD f/u.  i have advised to return for any worsening sx;s.      Kem Parkinson, PA-C 09/15/15 2341  Merrily Pew, MD 09/16/15 1300

## 2015-09-22 ENCOUNTER — Other Ambulatory Visit: Payer: Self-pay | Admitting: Family Medicine

## 2015-10-16 ENCOUNTER — Encounter: Payer: Self-pay | Admitting: Family Medicine

## 2015-10-16 ENCOUNTER — Ambulatory Visit (INDEPENDENT_AMBULATORY_CARE_PROVIDER_SITE_OTHER): Admitting: Family Medicine

## 2015-10-16 VITALS — BP 114/72 | HR 88 | Resp 16 | Ht 59.0 in | Wt 147.0 lb

## 2015-10-16 DIAGNOSIS — E119 Type 2 diabetes mellitus without complications: Secondary | ICD-10-CM

## 2015-10-16 DIAGNOSIS — F17200 Nicotine dependence, unspecified, uncomplicated: Secondary | ICD-10-CM | POA: Diagnosis not present

## 2015-10-16 DIAGNOSIS — D126 Benign neoplasm of colon, unspecified: Secondary | ICD-10-CM | POA: Diagnosis not present

## 2015-10-16 DIAGNOSIS — Z1159 Encounter for screening for other viral diseases: Secondary | ICD-10-CM

## 2015-10-16 DIAGNOSIS — Z91199 Patient's noncompliance with other medical treatment and regimen due to unspecified reason: Secondary | ICD-10-CM | POA: Insufficient documentation

## 2015-10-16 DIAGNOSIS — E785 Hyperlipidemia, unspecified: Secondary | ICD-10-CM

## 2015-10-16 DIAGNOSIS — N951 Menopausal and female climacteric states: Secondary | ICD-10-CM

## 2015-10-16 DIAGNOSIS — I10 Essential (primary) hypertension: Secondary | ICD-10-CM | POA: Diagnosis not present

## 2015-10-16 DIAGNOSIS — E663 Overweight: Secondary | ICD-10-CM | POA: Insufficient documentation

## 2015-10-16 DIAGNOSIS — Z9119 Patient's noncompliance with other medical treatment and regimen: Secondary | ICD-10-CM | POA: Insufficient documentation

## 2015-10-16 NOTE — Assessment & Plan Note (Signed)
Advised use of Replens, and more frequent sexual activity. Discussed the option of sparing topical estrogen if this fails

## 2015-10-16 NOTE — Assessment & Plan Note (Signed)
Hyperlipidemia:Low fat diet discussed and encouraged.   Lipid Panel  Lab Results  Component Value Date   CHOL 151 11/23/2014   HDL 45* 11/23/2014   LDLCALC 83 11/23/2014   TRIG 115 11/23/2014   CHOLHDL 3.4 11/23/2014   Updated lab needed at/ before next visit.

## 2015-10-16 NOTE — Assessment & Plan Note (Signed)
rept colonoscopy past due, pt will call and follow through

## 2015-10-16 NOTE — Assessment & Plan Note (Signed)
Deteriorated. Patient re-educated about  the importance of commitment to a  minimum of 150 minutes of exercise per week.  The importance of healthy food choices with portion control discussed. Encouraged to start a food diary, count calories and to consider  joining a support group. Sample diet sheets offered. Goals set by the patient for the next several months.   Weight /BMI 10/16/2015 09/13/2015 05/16/2015  WEIGHT 147 lb 140 lb 147 lb 6.4 oz  HEIGHT 4\' 11"  4\' 11"  5\' 2"   BMI 29.67 kg/m2 28.26 kg/m2 26.95 kg/m2    Current exercise per week 90 minutes.

## 2015-10-16 NOTE — Progress Notes (Signed)
Subjective:    Patient ID: Jaime Murray, female    DOB: May 17, 1956, 60 y.o.   MRN: [id number]  HPI   Jaime Murray     MRN: [id number]      DOB: [birth year]   HPI Jaime Murray is here for follow up and re-evaluation of chronic medical conditions, medication management and review of any available recent lab and radiology data.  Preventive health is updated, specifically  Cancer screening and Immunization.   Questions or concerns regarding consultations or procedures which the PT has had in the interim are  addressed. The PT denies any adverse reactions to current medications since the last visit.  C/o vaginal dryness ROS Denies recent fever or chills. Denies sinus pressure, nasal congestion, ear pain or sore throat. Denies chest congestion, productive cough or wheezing. Denies chest pains, palpitations and leg swelling Denies abdominal pain, nausea, vomiting,diarrhea or constipation.   Denies dysuria, frequency, hesitancy or incontinence. Denies joint pain, swelling and limitation in mobility. Denies headaches, seizures, numbness, or tingling. Denies depression, anxiety or insomnia. Denies skin break down or rash.   PE  BP 114/72 mmHg  Pulse 88  Resp 16  Ht 4\' 11"  (1.499 m)  Wt 147 lb (66.679 kg)  BMI 29.67 kg/m2  SpO2 98%  Patient alert and oriented and in no cardiopulmonary distress.  HEENT: No facial asymmetry, EOMI,   oropharynx pink and moist.  Neck supple no JVD, no mass.  Chest: Clear to auscultation bilaterally.  CVS: S1, S2 no murmurs, no S3.Regular rate.  ABD: Soft non tender.   Ext: No edema  MS: Adequate ROM spine, shoulders, hips and knees.  Skin: Intact, no ulcerations or rash noted.  Psych: Good eye contact, normal affect. Memory intact not anxious or depressed appearing.  CNS: CN 2-12 intact, power,  normal throughout.no focal deficits noted.   Assessment & Plan   NICOTINE ADDICTION Patient counseled for approximately 5 minutes  regarding the health risks of ongoing nicotine use, specifically all types of cancer, heart disease, stroke and respiratory failure. The options available for help with cessation ,the behavioral changes to assist the process, and the option to either gradully reduce usage  Or abruptly stop.is also discussed. Pt is also encouraged to set specific goals in number of cigarettes used daily, as well as to set a quit date.  Number of cigarettes/cigars currently smoking daily:5   Hypertension goal BP (blood pressure) < 130/80 Controlled, no change in medication DASH diet and commitment to daily physical activity for a minimum of 30 minutes discussed and encouraged, as a part of hypertension management. The importance of attaining a healthy weight is also discussed.  BP/Weight 10/16/2015 09/13/2015 05/16/2015 11/23/2014 09/29/2014 04/28/2014 [id number]  Systolic BP 99991111 [id number] [id number] [id number] [id number]  Diastolic BP 72 67 80 80 80 80 78  Wt. (Lbs) 147 140 147.4 150 153.4 148 145  BMI 29.67 28.26 26.95 27.43 30.97 29.88 29.27        DM (diabetes mellitus) Jaime Murray is reminded of the importance of commitment to daily physical activity for 30 minutes or more, as able and the need to limit carbohydrate intake to 30 to 60 grams per meal to help with blood sugar control.   The need to take medication as prescribed, test blood sugar as directed, and to call between visits if there is a concern that blood sugar is uncontrolled is also discussed.   Jaime Murray is reminded of the importance of daily foot exam, annual  eye examination, and good blood sugar, blood pressure and cholesterol control.  Diabetic Labs Latest Ref Rng 05/16/2015 11/23/2014 04/21/2014 11/18/2013 11/17/2013  HbA1c <5.7 % 6.5(H) 6.7(H) 6.7(H) 6.6(H) -  Microalbumin Not estab mg/dL 1.1 - - - 1.60  Micro/Creat Ratio <30 mcg/mg creat 5 - - - 4.1  Chol 0 - 200 mg/dL - 151 - 176 -  HDL >=46 mg/dL - 45(L) - 48 -  Calc LDL 0 - 99 mg/dL - 83 - 95 -    Triglycerides <150 mg/dL - 115 - 167(H) -  Creatinine 0.50 - 1.05 mg/dL 0.59 0.54 0.61 0.55 -   BP/Weight 10/16/2015 09/13/2015 05/16/2015 11/23/2014 09/29/2014 04/28/2014 [id number]  Systolic BP 99991111 123456 123456 [id number] 123456 123456  Diastolic BP 72 67 80 80 80 80 78  Wt. (Lbs) 147 140 147.4 150 153.4 148 145  BMI 29.67 28.26 26.95 27.43 30.97 29.88 29.27   Foot/eye exam completion dates Latest Ref Rng 11/23/2014 07/01/2014  Eye Exam No Retinopathy - No Retinopathy  Foot Form Completion - Done -       Updated lab needed at/ before next visit.   Hyperlipidemia Hyperlipidemia:Low fat diet discussed and encouraged.   Lipid Panel  Lab Results  Component Value Date   CHOL 151 11/23/2014   HDL 45* 11/23/2014   LDLCALC 83 11/23/2014   TRIG 115 11/23/2014   CHOLHDL 3.4 11/23/2014   Updated lab needed at/ before next visit.      ADENOMATOUS COLONIC POLYP rept colonoscopy past due, pt will call and follow through  Vaginal dryness, menopausal Advised use of Replens, and more frequent sexual activity. Discussed the option of sparing topical estrogen if this fails  Overweight (BMI 25.0-29.9) Deteriorated. Patient re-educated about  the importance of commitment to a  minimum of 150 minutes of exercise per week.  The importance of healthy food choices with portion control discussed. Encouraged to start a food diary, count calories and to consider  joining a support group. Sample diet sheets offered. Goals set by the patient for the next several months.   Weight /BMI 10/16/2015 09/13/2015 05/16/2015  WEIGHT 147 lb 140 lb 147 lb 6.4 oz  HEIGHT 4\' 11"  4\' 11"  5\' 2"   BMI 29.67 kg/m2 28.26 kg/m2 26.95 kg/m2    Current exercise per week 90 minutes.   Medical non-compliance Colonoscopy and mammogram past due, re iterated the importance of having both tests done as soon as possible       Review of Systems     Objective:   Physical Exam        Assessment & Plan:

## 2015-10-16 NOTE — Assessment & Plan Note (Signed)
Jaime Murray is reminded of the importance of commitment to daily physical activity for 30 minutes or more, as able and the need to limit carbohydrate intake to 30 to 60 grams per meal to help with blood sugar control.   The need to take medication as prescribed, test blood sugar as directed, and to call between visits if there is a concern that blood sugar is uncontrolled is also discussed.   Jaime Murray is reminded of the importance of daily foot exam, annual eye examination, and good blood sugar, blood pressure and cholesterol control.  Diabetic Labs Latest Ref Rng 05/16/2015 11/23/2014 04/21/2014 11/18/2013 11/17/2013  HbA1c <5.7 % 6.5(H) 6.7(H) 6.7(H) 6.6(H) -  Microalbumin Not estab mg/dL 1.1 - - - 1.60  Micro/Creat Ratio <30 mcg/mg creat 5 - - - 4.1  Chol 0 - 200 mg/dL - 151 - 176 -  HDL >=46 mg/dL - 45(L) - 48 -  Calc LDL 0 - 99 mg/dL - 83 - 95 -  Triglycerides <150 mg/dL - 115 - 167(H) -  Creatinine 0.50 - 1.05 mg/dL 0.59 0.54 0.61 0.55 -   BP/Weight 10/16/2015 09/13/2015 05/16/2015 11/23/2014 09/29/2014 04/28/2014 [id number]  Systolic BP 99991111 123456 123456 [id number] 123456 123456  Diastolic BP 72 67 80 80 80 80 78  Wt. (Lbs) 147 140 147.4 150 153.4 148 145  BMI 29.67 28.26 26.95 27.43 30.97 29.88 29.27   Foot/eye exam completion dates Latest Ref Rng 11/23/2014 07/01/2014  Eye Exam No Retinopathy - No Retinopathy  Foot Form Completion - Done -       Updated lab needed at/ before next visit.

## 2015-10-16 NOTE — Assessment & Plan Note (Signed)
Colonoscopy and mammogram past due, re iterated the importance of having both tests done as soon as possible

## 2015-10-16 NOTE — Patient Instructions (Signed)
F/u in 5 ,5 month, call if you need me sooner  Pls schedule and get mammogram, past due  Need to call and schedule colonoscopy, past due  Now on 5 cigarettes per day, you CAN QUIT, call 1800 QUIT NOW for help  Start riding for 30 minutes every day Use REPLENS for vaginal dryness  Labs today, lipid, cmp and EGFr, HBA1c, HIV  Non fast labs in 5.5 months, HBA1Cchem 7 and EGFR  Thanks for choosing [facility], we consider it a privelige to serve you.  

## 2015-10-16 NOTE — Assessment & Plan Note (Signed)

## 2015-10-16 NOTE — Assessment & Plan Note (Signed)
Controlled, no change in medication DASH diet and commitment to daily physical activity for a minimum of 30 minutes discussed and encouraged, as a part of hypertension management. The importance of attaining a healthy weight is also discussed.  BP/Weight 10/16/2015 09/13/2015 05/16/2015 11/23/2014 09/29/2014 04/28/2014 [id number]  Systolic BP 99991111 123456 123456 [id number] 123456 123456  Diastolic BP 72 67 80 80 80 80 78  Wt. (Lbs) 147 140 147.4 150 153.4 148 145  BMI 29.67 28.26 26.95 27.43 30.97 29.88 29.27

## 2015-10-20 LAB — COMPLETE METABOLIC PANEL WITH GFR
ALT: 13 U/L (ref 6–29)
AST: 21 U/L (ref 10–35)
Albumin: 3.9 g/dL (ref 3.6–5.1)
Alkaline Phosphatase: 66 U/L (ref 33–130)
BUN: 4 mg/dL — AB (ref 7–25)
CALCIUM: 8.7 mg/dL (ref 8.6–10.4)
CHLORIDE: 101 mmol/L (ref 98–110)
CO2: 28 mmol/L (ref 20–31)
CREATININE: 0.55 mg/dL (ref 0.50–1.05)
GFR, Est African American: >89 mL/min (ref >=60)
GFR, Est Non African American: >89 mL/min (ref >=60)
Glucose, Bld: 133 mg/dL — ABNORMAL HIGH (ref 65–99)
Potassium: 3.5 mmol/L (ref 3.5–5.3)
Sodium: 140 mmol/L (ref 135–146)
TOTAL PROTEIN: 6.4 g/dL (ref 6.1–8.1)
Total Bilirubin: 0.4 mg/dL (ref 0.2–1.2)

## 2015-10-20 LAB — HEMOGLOBIN A1C
HEMOGLOBIN A1C: 7.3 % — AB (ref <5.7)
Mean Plasma Glucose: 163 mg/dL

## 2015-10-20 LAB — LIPID PANEL
CHOLESTEROL: 176 mg/dL (ref 125–200)
HDL: 50 mg/dL (ref >=46)
LDL Cholesterol: 100 mg/dL (ref <130)
Total CHOL/HDL Ratio: 3.5 Ratio (ref <=5.0)
Triglycerides: 131 mg/dL (ref <150)
VLDL: 26 mg/dL (ref <30)

## 2015-10-20 LAB — HIV ANTIBODY (ROUTINE TESTING W REFLEX): HIV 1&2 Ab, 4th Generation: NONREACTIVE

## 2015-10-25 ENCOUNTER — Ambulatory Visit (HOSPITAL_COMMUNITY)
Admission: RE | Admit: 2015-10-25 | Discharge: 2015-10-25 | Disposition: A | Discharge status: Home / Self Care | Source: Ambulatory Visit | Attending: Family Medicine | Admitting: Family Medicine

## 2015-10-25 DIAGNOSIS — Z1231 Encounter for screening mammogram for malignant neoplasm of breast: Secondary | ICD-10-CM | POA: Insufficient documentation

## 2015-10-28 ENCOUNTER — Other Ambulatory Visit: Payer: Self-pay | Admitting: Family Medicine

## 2015-10-30 ENCOUNTER — Other Ambulatory Visit: Payer: Self-pay | Admitting: Family Medicine

## 2015-11-02 ENCOUNTER — Other Ambulatory Visit: Payer: Self-pay | Admitting: Family Medicine

## 2015-11-17 ENCOUNTER — Encounter: Payer: Self-pay | Admitting: Gastroenterology

## 2015-11-17 ENCOUNTER — Other Ambulatory Visit: Payer: Self-pay

## 2015-11-17 ENCOUNTER — Ambulatory Visit (INDEPENDENT_AMBULATORY_CARE_PROVIDER_SITE_OTHER): Admitting: Gastroenterology

## 2015-11-17 VITALS — BP 133/88 | HR 87 | Temp 98.8°F | Ht 59.0 in | Wt 145.2 lb

## 2015-11-17 DIAGNOSIS — Z860100 Personal history of colon polyps, unspecified: Secondary | ICD-10-CM | POA: Insufficient documentation

## 2015-11-17 DIAGNOSIS — Z800 Family history of malignant neoplasm of digestive organs: Secondary | ICD-10-CM | POA: Insufficient documentation

## 2015-11-17 MED ORDER — PEG-KCL-NACL-NASULF-NA ASC-C 100 G PO SOLR: 1.0000 | ORAL | Status: DC

## 2015-11-17 NOTE — Patient Instructions (Addendum)
We have scheduled you for a colonoscopy with Dr. Gala Romney in the near future.   Do not take metformin the day of the procedure.

## 2015-11-17 NOTE — Progress Notes (Signed)
cc'ed to pcp °

## 2015-11-17 NOTE — Progress Notes (Signed)
Primary Care Physician:  Tula Nakayama, MD Primary Gastroenterologist:  Dr. Gala Romney   Chief Complaint  Patient presents with  . Follow-up  . set up TCS    HPI:   Jaime Murray is a 60 y.o. female presenting today to arrange a surveillance colonoscopy. She has a personal history of adenomas and a family history of colon cancer in her mother, age of diagnosis unknown. Her mother is still living.    Takes Prilosec once daily. May take a TUMS here and there. No dysphagia. No concerning upper or lower GI symptoms. No abdominal pain, N/V.    Past Medical History  Diagnosis Date  . GERD (gastroesophageal reflux disease)   . Constipation   . Fatigue   . IDA (iron deficiency anemia)   . DM (diabetes mellitus) ([place])   . HTN (hypertension)   . Hypercholesteremia   . Anxiety   . Depression     Past Surgical History  Procedure Laterality Date  . Ganglion cyst excision  05/22/2012    Procedure: REMOVAL GANGLION OF WRIST;  Surgeon: Carole Civil, MD;  Location: [facility];  Service: Orthopedics;  Laterality: Left;  . Colonoscopy  01/01/10    Dr. Hansel Feinstein and sigmoid polyps, diminutive rectosigmoid polyp, tubular adenomas  . Esophagogastroduodenoscopy  12/14/09    Dr. Rourk:tight schatzki's ring s/p dilation/moderate size hiatal hernia    Current Outpatient Prescriptions  Medication Sig Dispense Refill  . acetaminophen (TYLENOL) 325 MG tablet Take 650 mg by mouth every 6 (six) hours as needed. For pain    . aspirin 81 MG tablet Take 81 mg by mouth daily.      . benazepril (LOTENSIN) 5 MG tablet Take 1 tablet (5 mg total) by mouth daily. 90 tablet 3  . cetirizine (ZYRTEC) 10 MG tablet TAKE ONE TABLET BY MOUTH ONCE DAILY 30 tablet 6  . cloNIDine (CATAPRES) 0.1 MG tablet TAKE 1 TABLET AT BEDTIME 90 tablet 0  . Diclofenac Sodium POWD APPLY 1 TO 2 PUMPS TO THE AFFECTED AREA(S) THREE TO FOUR TIMES DAILY. 200 g 0  . ferrous sulfate dried (SLOW FE) 160 (50 FE) MG TBCR Take 160  mg by mouth daily.      . fluticasone (FLONASE) 50 MCG/ACT nasal spray Place 2 sprays into both nostrils daily. 16 g 6  . hydrochlorothiazide (HYDRODIURIL) 25 MG tablet TAKE 1 TABLET DAILY 90 tablet 0  . hydrOXYzine (ATARAX/VISTARIL) 50 MG tablet One at bedtime as needed, for itch 30 tablet 0  . ibuprofen (ADVIL,MOTRIN) 600 MG tablet Take 1 tablet (600 mg total) by mouth every 6 (six) hours as needed. Take with meals 20 tablet 0  . KLOR-CON M20 20 MEQ tablet TAKE 1 TABLET TWICE A DAY 180 tablet 1  . lovastatin (MEVACOR) 40 MG tablet TAKE 1 TABLET AT BEDTIME 90 tablet 0  . metFORMIN (GLUCOPHAGE) 500 MG tablet TAKE 1 TABLET DAILY WITH BREAKFAST 90 tablet 0  . nitroGLYCERIN (NITROSTAT) 0.4 MG SL tablet Place 1 tablet (0.4 mg total) under the tongue every 5 (five) minutes x 3 doses as needed. For chest pain 30 tablet 1  . Omega 3 1000 MG CAPS Take 1 capsule by mouth daily.    Marland Kitchen omeprazole (PRILOSEC) 20 MG capsule TAKE 1 CAPSULE DAILY 90 capsule 1  . ondansetron (ZOFRAN) 4 MG tablet Take 1 tablet (4 mg total) by mouth daily. 6 tablet 0  . Probiotic Product (PROBIOTIC DAILY) CAPS Take 1 capsule by mouth daily.    Marland Kitchen  sertraline (ZOLOFT) 25 MG tablet TAKE 1 TABLET DAILY 90 tablet 1  . peg 3350 powder (MOVIPREP) 100 g SOLR Take 1 kit (200 g total) by mouth as directed. 1 kit 0   Current Facility-Administered Medications  Medication Dose Route Frequency Provider Last Rate Last Dose  . Influenza (>/= 3 years) inactive virus vaccine (FLVIRIN/FLUZONE) injection SUSP 0.5 mL  0.5 mL Intramuscular Once Fayrene Helper, MD        Allergies as of 11/17/2015  . (No Known Allergies)    Family History  Problem Relation Age of Onset  . Heart disease    . Arthritis    . Hypertension Mother   . Colon cancer Mother     unknown age of disease, still living, cancer-free   . Hypertension Father   . Stroke Father     Social History   Social History  . Marital Status: Married    Spouse Name: N/A  .  Number of Children: N/A  . Years of Education: 12   Occupational History  .     Social History Main Topics  . Smoking status: Current Every Day Smoker -- 0.25 packs/day for 30 years    Types: Cigarettes  . Smokeless tobacco: Not on file  . Alcohol Use: 4.8 oz/week    8 Glasses of wine per week     Comment: wine cooler on the weekend   . Drug Use: No  . Sexual Activity: Yes    Birth Control/ Protection: Post-menopausal   Other Topics Concern  . Not on file   Social History Narrative    Review of Systems: Gen: Denies any fever, chills, fatigue, weight loss, lack of appetite.  CV: Denies chest pain, heart palpitations, peripheral edema, syncope.  Resp: Denies shortness of breath at rest or with exertion. Denies wheezing or cough.  GI: see HPI  GU : Denies urinary burning, urinary frequency, urinary hesitancy MS: +joint pain  Derm: Denies rash, itching, dry skin Psych: Denies depression, anxiety, memory loss, and confusion Heme: Denies bruising, bleeding, and enlarged lymph nodes.  Physical Exam: BP 133/88 mmHg  Pulse 87  Temp(Src) 98.8 F (37.1 C)  Ht '4\' 11"'  (1.499 m)  Wt 145 lb 3.2 oz (65.862 kg)  BMI 29.31 kg/m2 General:   Alert and oriented. Pleasant and cooperative. Well-nourished and well-developed.  Head:  Normocephalic and atraumatic. Eyes:  Without icterus, sclera clear and conjunctiva pink.  Ears:  Normal auditory acuity. Nose:  No deformity, discharge,  or lesions. Mouth:  No deformity or lesions, oral mucosa pink.  Lungs:  Clear to auscultation bilaterally. No wheezes, rales, or rhonchi. No distress.  Heart:  S1, S2 present without murmurs appreciated.  Abdomen:  +BS, soft, non-tender and non-distended. No HSM noted. No guarding or rebound. No masses appreciated.  Rectal:  Deferred  Msk:  Symmetrical without gross deformities. Normal posture. Extremities:  Without edema. Neurologic:  Alert and  oriented x4;  grossly normal neurologically. Psych:  Alert  and cooperative. Normal mood and affect.

## 2015-11-17 NOTE — Assessment & Plan Note (Signed)
60 year old female with last colonoscopy in 2011, due for surveillance colonoscopy with personal history of colon polyps and mother with history of colon cancer. No concerning upper or lower GI symptoms.   Proceed with TCS with Dr. Gala Romney in near future: the risks, benefits, and alternatives have been discussed with the patient in detail. The patient states understanding and desires to proceed. Phenergan 25 mg IV on call due to occasional ETOH use on the weekends

## 2015-11-18 ENCOUNTER — Other Ambulatory Visit: Payer: Self-pay | Admitting: Family Medicine

## 2015-12-06 ENCOUNTER — Encounter (HOSPITAL_COMMUNITY): Payer: Self-pay | Admitting: *Deleted

## 2015-12-06 ENCOUNTER — Encounter (HOSPITAL_COMMUNITY)
Admission: RE | Disposition: A | Discharge status: Home Health | Payer: Self-pay | Source: Ambulatory Visit | Attending: Internal Medicine

## 2015-12-06 ENCOUNTER — Ambulatory Visit (HOSPITAL_COMMUNITY)
Admission: RE | Admit: 2015-12-06 | Discharge: 2015-12-06 | Disposition: A | Discharge status: Home Health | Source: Ambulatory Visit | Attending: Internal Medicine | Admitting: Internal Medicine

## 2015-12-06 DIAGNOSIS — F329 Major depressive disorder, single episode, unspecified: Secondary | ICD-10-CM | POA: Insufficient documentation

## 2015-12-06 DIAGNOSIS — E7800 Pure hypercholesterolemia, unspecified: Secondary | ICD-10-CM | POA: Diagnosis not present

## 2015-12-06 DIAGNOSIS — K219 Gastro-esophageal reflux disease without esophagitis: Secondary | ICD-10-CM | POA: Insufficient documentation

## 2015-12-06 DIAGNOSIS — E119 Type 2 diabetes mellitus without complications: Secondary | ICD-10-CM | POA: Insufficient documentation

## 2015-12-06 DIAGNOSIS — Z800 Family history of malignant neoplasm of digestive organs: Secondary | ICD-10-CM | POA: Diagnosis not present

## 2015-12-06 DIAGNOSIS — Z7982 Long term (current) use of aspirin: Secondary | ICD-10-CM | POA: Insufficient documentation

## 2015-12-06 DIAGNOSIS — F419 Anxiety disorder, unspecified: Secondary | ICD-10-CM | POA: Diagnosis not present

## 2015-12-06 DIAGNOSIS — F17210 Nicotine dependence, cigarettes, uncomplicated: Secondary | ICD-10-CM | POA: Diagnosis not present

## 2015-12-06 DIAGNOSIS — Z7984 Long term (current) use of oral hypoglycemic drugs: Secondary | ICD-10-CM | POA: Diagnosis not present

## 2015-12-06 DIAGNOSIS — Z7951 Long term (current) use of inhaled steroids: Secondary | ICD-10-CM | POA: Diagnosis not present

## 2015-12-06 DIAGNOSIS — Z86010 Personal history of colonic polyps: Secondary | ICD-10-CM | POA: Diagnosis not present

## 2015-12-06 DIAGNOSIS — I10 Essential (primary) hypertension: Secondary | ICD-10-CM | POA: Diagnosis not present

## 2015-12-06 DIAGNOSIS — Z1211 Encounter for screening for malignant neoplasm of colon: Secondary | ICD-10-CM | POA: Insufficient documentation

## 2015-12-06 DIAGNOSIS — Z79899 Other long term (current) drug therapy: Secondary | ICD-10-CM | POA: Diagnosis not present

## 2015-12-06 HISTORY — PX: COLONOSCOPY: SHX5424

## 2015-12-06 LAB — GLUCOSE, CAPILLARY: GLUCOSE-CAPILLARY: 116 mg/dL — AB (ref 65–99)

## 2015-12-06 SURGERY — COLONOSCOPY
Anesthesia: Moderate Sedation

## 2015-12-06 MED ORDER — MIDAZOLAM HCL 5 MG/5ML IJ SOLN
INTRAMUSCULAR | Status: AC
  Filled 2015-12-06: qty 10

## 2015-12-06 MED ORDER — MEPERIDINE HCL 100 MG/ML IJ SOLN
INTRAMUSCULAR | Status: DC | PRN
  Administered 2015-12-06: 50 mg via INTRAVENOUS

## 2015-12-06 MED ORDER — ONDANSETRON HCL 4 MG/2ML IJ SOLN
INTRAMUSCULAR | Status: DC | PRN
  Administered 2015-12-06: 4 mg via INTRAVENOUS

## 2015-12-06 MED ORDER — MEPERIDINE HCL 100 MG/ML IJ SOLN
INTRAMUSCULAR | Status: AC
  Filled 2015-12-06: qty 2

## 2015-12-06 MED ORDER — PROMETHAZINE HCL 25 MG/ML IJ SOLN
25.0000 mg | Freq: Once | INTRAMUSCULAR | Status: AC
  Administered 2015-12-06: 25 mg via INTRAVENOUS

## 2015-12-06 MED ORDER — SODIUM CHLORIDE 0.9 % IV SOLN
INTRAVENOUS | Status: DC
  Administered 2015-12-06: 1000 mL via INTRAVENOUS

## 2015-12-06 MED ORDER — ONDANSETRON HCL 4 MG/2ML IJ SOLN
INTRAMUSCULAR | Status: AC
  Filled 2015-12-06: qty 2

## 2015-12-06 MED ORDER — STERILE WATER FOR IRRIGATION IR SOLN
Status: DC | PRN
  Administered 2015-12-06: 08:00:00

## 2015-12-06 MED ORDER — SODIUM CHLORIDE 0.9% FLUSH
INTRAVENOUS | Status: AC
  Filled 2015-12-06: qty 10

## 2015-12-06 MED ORDER — MIDAZOLAM HCL 5 MG/5ML IJ SOLN
INTRAMUSCULAR | Status: DC | PRN
  Administered 2015-12-06 (×2): 1 mg via INTRAVENOUS
  Administered 2015-12-06: 2 mg via INTRAVENOUS

## 2015-12-06 MED ORDER — PROMETHAZINE HCL 25 MG/ML IJ SOLN
INTRAMUSCULAR | Status: AC
  Filled 2015-12-06: qty 1

## 2015-12-06 NOTE — H&P (View-Only) (Signed)
Primary Care Physician:  Tula Nakayama, MD Primary Gastroenterologist:  Dr. Gala Romney   Chief Complaint  Patient presents with  . Follow-up  . set up TCS    HPI:   Jaime Murray is a 60 y.o. female presenting today to arrange a surveillance colonoscopy. She has a personal history of adenomas and a family history of colon cancer in her mother, age of diagnosis unknown. Her mother is still living.    Takes Prilosec once daily. May take a TUMS here and there. No dysphagia. No concerning upper or lower GI symptoms. No abdominal pain, N/V.    Past Medical History  Diagnosis Date  . GERD (gastroesophageal reflux disease)   . Constipation   . Fatigue   . IDA (iron deficiency anemia)   . DM (diabetes mellitus) ([place])   . HTN (hypertension)   . Hypercholesteremia   . Anxiety   . Depression     Past Surgical History  Procedure Laterality Date  . Ganglion cyst excision  05/22/2012    Procedure: REMOVAL GANGLION OF WRIST;  Surgeon: Carole Civil, MD;  Location: [facility];  Service: Orthopedics;  Laterality: Left;  . Colonoscopy  01/01/10    Dr. Hansel Feinstein and sigmoid polyps, diminutive rectosigmoid polyp, tubular adenomas  . Esophagogastroduodenoscopy  12/14/09    Dr. Rourk:tight schatzki's ring s/p dilation/moderate size hiatal hernia    Current Outpatient Prescriptions  Medication Sig Dispense Refill  . acetaminophen (TYLENOL) 325 MG tablet Take 650 mg by mouth every 6 (six) hours as needed. For pain    . aspirin 81 MG tablet Take 81 mg by mouth daily.      . benazepril (LOTENSIN) 5 MG tablet Take 1 tablet (5 mg total) by mouth daily. 90 tablet 3  . cetirizine (ZYRTEC) 10 MG tablet TAKE ONE TABLET BY MOUTH ONCE DAILY 30 tablet 6  . cloNIDine (CATAPRES) 0.1 MG tablet TAKE 1 TABLET AT BEDTIME 90 tablet 0  . Diclofenac Sodium POWD APPLY 1 TO 2 PUMPS TO THE AFFECTED AREA(S) THREE TO FOUR TIMES DAILY. 200 g 0  . ferrous sulfate dried (SLOW FE) 160 (50 FE) MG TBCR Take 160  mg by mouth daily.      . fluticasone (FLONASE) 50 MCG/ACT nasal spray Place 2 sprays into both nostrils daily. 16 g 6  . hydrochlorothiazide (HYDRODIURIL) 25 MG tablet TAKE 1 TABLET DAILY 90 tablet 0  . hydrOXYzine (ATARAX/VISTARIL) 50 MG tablet One at bedtime as needed, for itch 30 tablet 0  . ibuprofen (ADVIL,MOTRIN) 600 MG tablet Take 1 tablet (600 mg total) by mouth every 6 (six) hours as needed. Take with meals 20 tablet 0  . KLOR-CON M20 20 MEQ tablet TAKE 1 TABLET TWICE A DAY 180 tablet 1  . lovastatin (MEVACOR) 40 MG tablet TAKE 1 TABLET AT BEDTIME 90 tablet 0  . metFORMIN (GLUCOPHAGE) 500 MG tablet TAKE 1 TABLET DAILY WITH BREAKFAST 90 tablet 0  . nitroGLYCERIN (NITROSTAT) 0.4 MG SL tablet Place 1 tablet (0.4 mg total) under the tongue every 5 (five) minutes x 3 doses as needed. For chest pain 30 tablet 1  . Omega 3 1000 MG CAPS Take 1 capsule by mouth daily.    Marland Kitchen omeprazole (PRILOSEC) 20 MG capsule TAKE 1 CAPSULE DAILY 90 capsule 1  . ondansetron (ZOFRAN) 4 MG tablet Take 1 tablet (4 mg total) by mouth daily. 6 tablet 0  . Probiotic Product (PROBIOTIC DAILY) CAPS Take 1 capsule by mouth daily.    Marland Kitchen  sertraline (ZOLOFT) 25 MG tablet TAKE 1 TABLET DAILY 90 tablet 1  . peg 3350 powder (MOVIPREP) 100 g SOLR Take 1 kit (200 g total) by mouth as directed. 1 kit 0   Current Facility-Administered Medications  Medication Dose Route Frequency Provider Last Rate Last Dose  . Influenza (>/= 3 years) inactive virus vaccine (FLVIRIN/FLUZONE) injection SUSP 0.5 mL  0.5 mL Intramuscular Once Fayrene Helper, MD        Allergies as of 11/17/2015  . (No Known Allergies)    Family History  Problem Relation Age of Onset  . Heart disease    . Arthritis    . Hypertension Mother   . Colon cancer Mother     unknown age of disease, still living, cancer-free   . Hypertension Father   . Stroke Father     Social History   Social History  . Marital Status: Married    Spouse Name: N/A  .  Number of Children: N/A  . Years of Education: 12   Occupational History  .     Social History Main Topics  . Smoking status: Current Every Day Smoker -- 0.25 packs/day for 30 years    Types: Cigarettes  . Smokeless tobacco: Not on file  . Alcohol Use: 4.8 oz/week    8 Glasses of wine per week     Comment: wine cooler on the weekend   . Drug Use: No  . Sexual Activity: Yes    Birth Control/ Protection: Post-menopausal   Other Topics Concern  . Not on file   Social History Narrative    Review of Systems: Gen: Denies any fever, chills, fatigue, weight loss, lack of appetite.  CV: Denies chest pain, heart palpitations, peripheral edema, syncope.  Resp: Denies shortness of breath at rest or with exertion. Denies wheezing or cough.  GI: see HPI  GU : Denies urinary burning, urinary frequency, urinary hesitancy MS: +joint pain  Derm: Denies rash, itching, dry skin Psych: Denies depression, anxiety, memory loss, and confusion Heme: Denies bruising, bleeding, and enlarged lymph nodes.  Physical Exam: BP 133/88 mmHg  Pulse 87  Temp(Src) 98.8 F (37.1 C)  Ht '4\' 11"'  (1.499 m)  Wt 145 lb 3.2 oz (65.862 kg)  BMI 29.31 kg/m2 General:   Alert and oriented. Pleasant and cooperative. Well-nourished and well-developed.  Head:  Normocephalic and atraumatic. Eyes:  Without icterus, sclera clear and conjunctiva pink.  Ears:  Normal auditory acuity. Nose:  No deformity, discharge,  or lesions. Mouth:  No deformity or lesions, oral mucosa pink.  Lungs:  Clear to auscultation bilaterally. No wheezes, rales, or rhonchi. No distress.  Heart:  S1, S2 present without murmurs appreciated.  Abdomen:  +BS, soft, non-tender and non-distended. No HSM noted. No guarding or rebound. No masses appreciated.  Rectal:  Deferred  Msk:  Symmetrical without gross deformities. Normal posture. Extremities:  Without edema. Neurologic:  Alert and  oriented x4;  grossly normal neurologically. Psych:  Alert  and cooperative. Normal mood and affect.

## 2015-12-06 NOTE — Op Note (Signed)
[facility] Patient Name: Jaime Murray Procedure Date: 12/06/2015 7:31 AM MRN: [id number] Date of Birth: 09/07/1955 Attending MD: Norvel Richards , MD CSN: [id number] Age: 60 Admit Type: Outpatient Procedure:                Ileo-colonoscopy - surveillance examination Indications:              High risk colon cancer surveillance: Personal                            history of colonic polyps, Family history of colon                            cancer in a first-degree relative Providers:                Norvel Richards, MD, Gwenlyn Fudge, RN, Randa Spike, Technician Referring MD:              Medicines:                Midazolam 4 mg IV, Meperidine 50 mg IV Complications:             Estimated Blood Loss:     Estimated blood loss: none. Procedure:                Pre-Anesthesia Assessment:                           - Prior to the procedure, a History and Physical                            was performed, and patient medications and                            allergies were reviewed. The patient's tolerance of                            previous anesthesia was also reviewed. The risks                            and benefits of the procedure and the sedation                            options and risks were discussed with the patient.                            All questions were answered, and informed consent                            was obtained. ASA Grade Assessment: II - A patient                            with mild systemic disease. After reviewing the  risks and benefits, the patient was deemed in                            satisfactory condition to undergo the procedure.                           After obtaining informed consent, the colonoscope                            was passed under direct vision. Throughout the                            procedure, the patient's blood pressure, pulse, and               oxygen saturations were monitored continuously. The                            [id number]) scope was introduced through                            the anus and advanced to the 5 cm into the ileum.                            The colonoscopy was performed without difficulty.                            The patient tolerated the procedure well. The                            quality of the bowel preparation was adequate. The                            terminal ileum, ileocecal valve, appendiceal                            orifice, and rectum were photographed. The entire                            colon was well visualized. Scope In: 7:39:13 AM Scope Out: [id number] AM Scope Withdrawal Time: 0 hours 9 minutes 55 seconds  Total Procedure Duration: 0 hours 17 minutes 24 seconds  Findings:      The perianal and digital rectal examinations were normal      The colon (entire examined portion) appeared normal except for a couple       of mamallations in distal sigmoid segment      The terminal ileum appeared normal. Impression:               - The entire examined colon is normal.                           - The examined portion of the ileum was normal.                           - No specimens collected. Moderate Sedation:  Moderate (conscious) sedation was administered by the endoscopy nurse       and supervised by the endoscopist. The following parameters were       monitored: oxygen saturation, heart rate, blood pressure, respiratory       rate, EKG, adequacy of pulmonary ventilation, and response to care.       Total physician intraservice time was 31 minutes.      Moderate (conscious) sedation was administered by the endoscopy nurse       and supervised by the endoscopist. The following parameters were       monitored: oxygen saturation, heart rate, blood pressure, respiratory       rate, EKG, adequacy of pulmonary ventilation, and response to care.       Total physician  intraservice time was 31 minutes. Recommendation:           - Advance diet as tolerated.                           - Continue present medications.                           - Repeat colonoscopy in 5 years for surveillance.                           - Continue present medications. Procedure Code(s):        --- Professional ---                           [phone], Colonoscopy, flexible; diagnostic, including                            collection of specimen(s) by brushing or washing,                            when performed (separate procedure)                           99152, Moderate sedation services provided by the                            same physician or other qualified health care                            professional performing the diagnostic or                            therapeutic service that the sedation supports,                            requiring the presence of an independent trained                            observer to assist in the monitoring of the                            patient's level of consciousness and physiological  status; initial 15 minutes of intraservice time,                            patient age 22 years or older                           [phone], Moderate sedation services provided by the                            same physician or other qualified health care                            professional performing the diagnostic or                            therapeutic service that the sedation supports,                            requiring the presence of an independent trained                            observer to assist in the monitoring of the                            patient's level of consciousness and physiological                            status; initial 15 minutes of intraservice time,                            patient age 61 years or older                           [phone], Moderate sedation services; each additional                             15 minutes intraservice time                           99153, Moderate sedation services; each additional                            15 minutes intraservice time Diagnosis Code(s):        --- Professional ---                           Z86.010, Personal history of colonic polyps                           Z80.0, Family history of malignant neoplasm of                            digestive organs CPT copyright 2016 [employer]. All rights reserved. The codes documented in this report are preliminary and upon coder review may  be revised to  meet current compliance requirements. Cristopher Estimable. Rourk, MD Norvel Richards, MD 12/06/2015 8:10:56 AM This report has been signed electronically. Number of Addenda: 0

## 2015-12-06 NOTE — Interval H&P Note (Signed)
History and Physical Interval Note:  12/06/2015 7:32 AM  Jaime Murray  has presented today for surgery, with the diagnosis of history of polyps  The various methods of treatment have been discussed with the patient and family. After consideration of risks, benefits and other options for treatment, the patient has consented to  Procedure(s) with comments: COLONOSCOPY (N/A) - 730 as a surgical intervention .  The patient's history has been reviewed, patient examined, no change in status, stable for surgery.  I have reviewed the patient's chart and labs.  Questions were answered to the patient's satisfaction.     Jaime Murray  No change. Surveillance colonoscopy per plan.  The risks, benefits, limitations, alternatives and imponderables have been reviewed with the patient. Questions have been answered. All parties are agreeable.

## 2015-12-06 NOTE — Discharge Instructions (Signed)

## 2015-12-07 ENCOUNTER — Encounter (HOSPITAL_COMMUNITY): Payer: Self-pay | Admitting: Internal Medicine

## 2015-12-19 ENCOUNTER — Other Ambulatory Visit: Payer: Self-pay | Admitting: Family Medicine

## 2015-12-23 ENCOUNTER — Other Ambulatory Visit: Payer: Self-pay | Admitting: Family Medicine

## 2016-01-18 ENCOUNTER — Other Ambulatory Visit: Payer: Self-pay | Admitting: Family Medicine

## 2016-01-31 ENCOUNTER — Other Ambulatory Visit: Payer: Self-pay | Admitting: Family Medicine

## 2016-04-11 ENCOUNTER — Ambulatory Visit: Admitting: Family Medicine

## 2016-05-18 ENCOUNTER — Other Ambulatory Visit: Payer: Self-pay | Admitting: Family Medicine

## 2016-05-31 ENCOUNTER — Ambulatory Visit: Admitting: Family Medicine

## 2016-06-25 ENCOUNTER — Telehealth: Payer: Self-pay

## 2016-06-25 DIAGNOSIS — E119 Type 2 diabetes mellitus without complications: Secondary | ICD-10-CM

## 2016-06-25 NOTE — Telephone Encounter (Signed)
Expired labs reordered  

## 2016-06-26 ENCOUNTER — Ambulatory Visit (INDEPENDENT_AMBULATORY_CARE_PROVIDER_SITE_OTHER): Admitting: Family Medicine

## 2016-06-26 ENCOUNTER — Encounter: Payer: Self-pay | Admitting: Family Medicine

## 2016-06-26 VITALS — BP 136/78 | HR 86 | Temp 98.8°F | Resp 16 | Ht 59.0 in | Wt 148.0 lb

## 2016-06-26 DIAGNOSIS — M7918 Myalgia, other site: Secondary | ICD-10-CM

## 2016-06-26 DIAGNOSIS — M791 Myalgia: Secondary | ICD-10-CM

## 2016-06-26 DIAGNOSIS — R52 Pain, unspecified: Secondary | ICD-10-CM

## 2016-06-26 DIAGNOSIS — Z23 Encounter for immunization: Secondary | ICD-10-CM | POA: Diagnosis not present

## 2016-06-26 LAB — BASIC METABOLIC PANEL WITH GFR
BUN: 7 mg/dL (ref 7–25)
CALCIUM: 9.3 mg/dL (ref 8.6–10.4)
CHLORIDE: 102 mmol/L (ref 98–110)
CO2: 25 mmol/L (ref 20–31)
Creat: 0.6 mg/dL (ref 0.50–1.05)
GFR, Est African American: >89 mL/min (ref >=60)
GLUCOSE: 98 mg/dL (ref 65–99)
POTASSIUM: 3.5 mmol/L (ref 3.5–5.3)
SODIUM: 140 mmol/L (ref 135–146)

## 2016-06-26 LAB — HEMOGLOBIN A1C
Hgb A1c MFr Bld: 6.5 % — ABNORMAL HIGH (ref <5.7)
Mean Plasma Glucose: 140 mg/dL

## 2016-06-26 MED ORDER — METHYLPREDNISOLONE 4 MG PO TBPK: ORAL_TABLET | ORAL | 0 refills | Status: DC

## 2016-06-26 MED ORDER — METHYLPREDNISOLONE ACETATE 80 MG/ML IJ SUSP
80.0000 mg | Freq: Once | INTRAMUSCULAR | Status: AC
  Administered 2016-06-26: 80 mg via INTRAMUSCULAR

## 2016-06-26 MED ORDER — KETOROLAC TROMETHAMINE 60 MG/2ML IM SOLN
60.0000 mg | Freq: Once | INTRAMUSCULAR | Status: AC
  Administered 2016-06-26: 60 mg via INTRAMUSCULAR

## 2016-06-26 NOTE — Progress Notes (Signed)
Chief Complaint  Patient presents with  . Back Pain   Here for "back pain.  Actually is having pain from neck to back and some in hands and feet.  Says she has been to a joint specialist and has 'the rheumatism" and when she has a flare up , only prednisone helps.  This episode has been going on fo weeks and she is very fatigued.  Is taking #3 tylenol 325 TID and also her ibuprofen 600 mg TID without relief.  No fall or injury or overuse.  No recent infection or fever.  No rash or other symptoms.  No radiation of pain into arms or legs. No change in bowel or bladder.   The pain is a "deep ache" in the spine with stiffness worse in the am.    Patient Active Problem List   Diagnosis Date Noted  . Family history of colon cancer   . FH: colon cancer 11/17/2015  . History of colonic polyps 11/17/2015  . Vaginal dryness, menopausal 10/16/2015  . Overweight (BMI 25.0-29.9) 10/16/2015  . Medical non-compliance 10/16/2015  . Dermatitis 05/16/2015  . Sleep disorder breathing 04/29/2014  . Generalized osteoarthritis 04/28/2014  . Ganglion cyst 05/14/2012  . Ganglion cyst of wrist 04/11/2012  . Cervical back pain with evidence of disc disease 04/09/2012  . Hyperlipidemia 04/09/2012  . Allergic rhinitis 09/03/2011  . Iron deficiency anemia 09/19/2010  . GERD 09/19/2010  . ADENOMATOUS COLONIC POLYP 02/27/2010  . DYSPHAGIA UNSPECIFIED 12/12/2009  . DM (diabetes mellitus) ([place]) 07/26/2009  . FATIGUE 02/20/2009  . NICOTINE ADDICTION 11/24/2007  . Hypertension goal BP (blood pressure) < 130/80 11/24/2007    Outpatient Encounter Prescriptions as of 06/26/2016  Medication Sig  . acetaminophen (TYLENOL) 325 MG tablet Take 650 mg by mouth every 6 (six) hours as needed. For pain  . aspirin 81 MG tablet Take 81 mg by mouth daily.    . benazepril (LOTENSIN) 5 MG tablet Take 1 tablet (5 mg total) by mouth daily.  . cetirizine (ZYRTEC) 10 MG tablet TAKE ONE TABLET BY MOUTH ONCE DAILY  . cloNIDine  (CATAPRES) 0.1 MG tablet TAKE 1 TABLET AT BEDTIME  . Diclofenac Sodium POWD APPLY 1 TO 2 PUMPS TO THE AFFECTED AREA(S) THREE TO FOUR TIMES DAILY.  . ferrous sulfate dried (SLOW FE) 160 (50 FE) MG TBCR Take 160 mg by mouth daily.    . fluticasone (FLONASE) 50 MCG/ACT nasal spray Place 2 sprays into both nostrils daily.  . hydrochlorothiazide (HYDRODIURIL) 25 MG tablet TAKE 1 TABLET DAILY  . hydrOXYzine (ATARAX/VISTARIL) 50 MG tablet One at bedtime as needed, for itch  . ibuprofen (ADVIL,MOTRIN) 600 MG tablet Take 1 tablet (600 mg total) by mouth every 6 (six) hours as needed. Take with meals  . KLOR-CON M20 20 MEQ tablet TAKE 1 TABLET TWICE A DAY (Patient taking differently: TAKE 1 TABLET)  . lovastatin (MEVACOR) 40 MG tablet TAKE 1 TABLET AT BEDTIME  . metFORMIN (GLUCOPHAGE) 500 MG tablet TAKE 1 TABLET DAILY WITH BREAKFAST  . nitroGLYCERIN (NITROSTAT) 0.4 MG SL tablet Place 1 tablet (0.4 mg total) under the tongue every 5 (five) minutes x 3 doses as needed. For chest pain  . Omega 3 1000 MG CAPS Take 1 capsule by mouth daily.  Marland Kitchen omeprazole (PRILOSEC) 20 MG capsule TAKE 1 CAPSULE DAILY  . peg 3350 powder (MOVIPREP) 100 g SOLR Take 1 kit (200 g total) by mouth as directed.  . Probiotic Product (PROBIOTIC DAILY) CAPS Take 1 capsule  by mouth daily.  . sertraline (ZOLOFT) 25 MG tablet TAKE 1 TABLET DAILY  . methylPREDNISolone (MEDROL DOSEPAK) 4 MG TBPK tablet TAD  . ondansetron (ZOFRAN) 4 MG tablet Take 1 tablet (4 mg total) by mouth daily. (Patient not taking: Reported on 06/26/2016)   Facility-Administered Encounter Medications as of 06/26/2016  Medication  . Influenza (>/= 3 years) inactive virus vaccine (FLVIRIN/FLUZONE) injection SUSP 0.5 mL  . ketorolac (TORADOL) injection 60 mg  . methylPREDNISolone acetate (DEPO-MEDROL) injection 80 mg    No Known Allergies  Review of Systems  Constitutional: Positive for activity change and fatigue. Negative for appetite change, chills and fever.    HENT: Negative.  Negative for congestion.   Eyes: Negative.  Negative for visual disturbance.  Respiratory: Negative.  Negative for cough and shortness of breath.   Cardiovascular: Negative for chest pain, palpitations and leg swelling.  Gastrointestinal: Negative for constipation and diarrhea.       Manages chronic constipation  Genitourinary: Negative for difficulty urinating and frequency.  Musculoskeletal: Positive for arthralgias, back pain, gait problem, myalgias, neck pain and neck stiffness. Negative for joint swelling.  Skin: Negative for rash.  Neurological: Negative for dizziness and numbness.  Psychiatric/Behavioral: Positive for sleep disturbance. Negative for dysphoric mood. The patient is not nervous/anxious.     BP 136/78 (BP Location: Right Arm, Patient Position: Sitting, Cuff Size: Normal)   Pulse 86   Temp 98.8 F (37.1 C) (Oral)   Resp 16   Ht '4\' 11"'  (1.499 m)   Wt 148 lb 0.6 oz (67.2 kg)   SpO2 96%   BMI 29.90 kg/m   Physical Exam  Constitutional: She is oriented to person, place, and time. She appears well-developed and well-nourished. She appears distressed.  Moderately stiff and uncomfortale  HENT:  Head: Normocephalic and atraumatic.  Mouth/Throat: Oropharynx is clear and moist.  Neck: Normal range of motion.  Full but slow ROM neck  Cardiovascular: Normal rate, regular rhythm and normal heart sounds.   Pulmonary/Chest: Effort normal and breath sounds normal. No respiratory distress. She has no rales. She exhibits no tenderness.  Abdominal: Soft. Bowel sounds are normal.  Musculoskeletal:  Tender Paraspinous muscles.  Not specifically tender points fibromyalgia.  strength sensation ROM and reflexes intact all extremities  Neurological: She is alert and oriented to person, place, and time.  Psychiatric: She has a normal mood and affect. Her behavior is normal. Thought content normal.    ASSESSMENT/PLAN:  1. Need for prophylactic vaccination and  inoculation against influenza  - Flu Vaccine QUAD 36+ mos IM  2. Acute myofascial pain This is the regimen used by her family doctor for years and is requested by patient as it gives quick relief.  tolerated well in past. - methylPREDNISolone acetate (DEPO-MEDROL) injection 80 mg; Inject 1 mL (80 mg total) into the muscle once. - ketorolac (TORADOL) injection 60 mg; Inject 2 mLs (60 mg total) into the muscle once.   Patient Instructions  Rest, activity as tolerated  Follow up with Dr Moshe Cipro  Call for problems     Raylene Everts, MD

## 2016-06-26 NOTE — Patient Instructions (Signed)
Rest, activity as tolerated  Follow up with Dr Moshe Cipro  Call for problems

## 2016-07-23 ENCOUNTER — Other Ambulatory Visit: Payer: Self-pay

## 2016-07-23 MED ORDER — CLONIDINE HCL 0.1 MG PO TABS: 0.1000 mg | ORAL_TABLET | Freq: Every day | ORAL | 0 refills | Status: DC

## 2016-08-06 ENCOUNTER — Other Ambulatory Visit: Payer: Self-pay

## 2016-08-06 DIAGNOSIS — E119 Type 2 diabetes mellitus without complications: Secondary | ICD-10-CM

## 2016-08-06 MED ORDER — METFORMIN HCL 500 MG PO TABS: 500.0000 mg | ORAL_TABLET | Freq: Every day | ORAL | 0 refills | Status: DC

## 2016-08-06 MED ORDER — POTASSIUM CHLORIDE CRYS ER 20 MEQ PO TBCR: 20.0000 meq | EXTENDED_RELEASE_TABLET | Freq: Two times a day (BID) | ORAL | 0 refills | Status: DC

## 2016-08-06 MED ORDER — SERTRALINE HCL 25 MG PO TABS: 25.0000 mg | ORAL_TABLET | Freq: Every day | ORAL | 0 refills | Status: DC

## 2016-08-06 MED ORDER — HYDROCHLOROTHIAZIDE 25 MG PO TABS: 25.0000 mg | ORAL_TABLET | Freq: Every day | ORAL | 0 refills | Status: DC

## 2016-08-06 MED ORDER — CETIRIZINE HCL 10 MG PO TABS: 10.0000 mg | ORAL_TABLET | Freq: Every day | ORAL | 0 refills | Status: DC

## 2016-08-06 MED ORDER — BENAZEPRIL HCL 5 MG PO TABS: 5.0000 mg | ORAL_TABLET | Freq: Every day | ORAL | 0 refills | Status: DC

## 2016-08-06 MED ORDER — OMEPRAZOLE 20 MG PO CPDR: 20.0000 mg | DELAYED_RELEASE_CAPSULE | Freq: Every day | ORAL | 0 refills | Status: DC

## 2016-08-06 MED ORDER — LOVASTATIN 40 MG PO TABS: 40.0000 mg | ORAL_TABLET | Freq: Every day | ORAL | 0 refills | Status: DC

## 2016-09-12 ENCOUNTER — Ambulatory Visit (INDEPENDENT_AMBULATORY_CARE_PROVIDER_SITE_OTHER): Admitting: Family Medicine

## 2016-09-12 ENCOUNTER — Other Ambulatory Visit (HOSPITAL_COMMUNITY)
Admission: RE | Admit: 2016-09-12 | Discharge: 2016-09-12 | Disposition: A | Discharge status: Home / Self Care | Source: Other Acute Inpatient Hospital | Attending: Family Medicine | Admitting: Family Medicine

## 2016-09-12 ENCOUNTER — Encounter: Payer: Self-pay | Admitting: Family Medicine

## 2016-09-12 VITALS — BP 130/84 | HR 93 | Resp 15 | Ht 59.0 in | Wt 145.8 lb

## 2016-09-12 DIAGNOSIS — E663 Overweight: Secondary | ICD-10-CM | POA: Diagnosis not present

## 2016-09-12 DIAGNOSIS — I10 Essential (primary) hypertension: Secondary | ICD-10-CM

## 2016-09-12 DIAGNOSIS — E119 Type 2 diabetes mellitus without complications: Secondary | ICD-10-CM

## 2016-09-12 DIAGNOSIS — E785 Hyperlipidemia, unspecified: Secondary | ICD-10-CM

## 2016-09-12 DIAGNOSIS — F17200 Nicotine dependence, unspecified, uncomplicated: Secondary | ICD-10-CM

## 2016-09-12 DIAGNOSIS — Z23 Encounter for immunization: Secondary | ICD-10-CM | POA: Diagnosis not present

## 2016-09-12 DIAGNOSIS — D509 Iron deficiency anemia, unspecified: Secondary | ICD-10-CM | POA: Diagnosis not present

## 2016-09-12 DIAGNOSIS — F339 Major depressive disorder, recurrent, unspecified: Secondary | ICD-10-CM

## 2016-09-12 DIAGNOSIS — M159 Polyosteoarthritis, unspecified: Secondary | ICD-10-CM

## 2016-09-12 DIAGNOSIS — G4730 Sleep apnea, unspecified: Secondary | ICD-10-CM | POA: Diagnosis not present

## 2016-09-12 MED ORDER — SERTRALINE HCL 50 MG PO TABS: 50.0000 mg | ORAL_TABLET | Freq: Every day | ORAL | 5 refills | Status: DC

## 2016-09-12 MED ORDER — KETOROLAC TROMETHAMINE 60 MG/2ML IM SOLN
60.0000 mg | Freq: Once | INTRAMUSCULAR | Status: AC
  Administered 2016-09-12: 60 mg via INTRAMUSCULAR

## 2016-09-12 MED ORDER — METHYLPREDNISOLONE ACETATE 80 MG/ML IJ SUSP
80.0000 mg | Freq: Once | INTRAMUSCULAR | Status: AC
  Administered 2016-09-12: 80 mg via INTRAMUSCULAR

## 2016-09-12 NOTE — Assessment & Plan Note (Signed)
Controlled, no change in medication DASH diet and commitment to daily physical activity for a minimum of 30 minutes discussed and encouraged, as a part of hypertension management. The importance of attaining a healthy weight is also discussed.  BP/Weight 09/12/2016 06/26/2016 12/06/2015 11/17/2015 10/16/2015 09/13/2015 [id number]  Systolic BP [id number]  Diastolic BP 84 78 81 88 72 67 80  Wt. (Lbs) 145.8 148.04 145 145.2 147 140 147.4  BMI 29.45 29.9 29.27 29.31 29.67 28.26 26.95

## 2016-09-12 NOTE — Assessment & Plan Note (Addendum)
Increased Depression symptoms in past 6 months, may have SAD component. Not suicidal or homicidal , increase dose of zoloft

## 2016-09-12 NOTE — Assessment & Plan Note (Signed)
Again discussed need for sleep study and ttreatmentg pt refuses still, thogh symptomatic

## 2016-09-12 NOTE — Assessment & Plan Note (Signed)

## 2016-09-12 NOTE — Patient Instructions (Addendum)
CPE middle May, call if you need me sooner  Fasting cBC, lipid, cmp and eGFR, hBA1C first week in May  Increase in the dose of  medication for depression and please commit to regular exercise for 30 minutes daily and make specific effort to interact more with your family.  You will receive injections of Toradol and Depo-Medrol in the office today for acute pain in the shoulder and low back radiating to her left leg.  Pneumonia 23 vaccine is being given today.  You are referred for eye exam  Your foot exam is good   Please schedule your mammogram  It is very important that you commit to smoking cessation entirely. This will reduce her risk of all forms of cancer, heart disease and stroke. There is a 1 800 number which you can call 24 hours per day for help.  e and reduce canned foods and sodium intake so that she will promote better health.  I still do recommend sleep study evaluation based on your symptoms.  Thank you  for choosing [facility]. We consider it a privelige to serve you.  Delivering excellent health care in a caring and  compassionate way is our goal.  Partnering with you,  so that together we can achieve this goal is our strategy.      Steps to Quit Smoking Smoking tobacco can be bad for your health. It can also affect almost every organ in your body. Smoking puts you and people around you at risk for many serious long-lasting (chronic) diseases. Quitting smoking is hard, but it is one of the best things that you can do for your health. It is never too late to quit. What are the benefits of quitting smoking? When you quit smoking, you lower your risk for getting serious diseases and conditions. They can include:  Lung cancer or lung disease.  Heart disease.  Stroke.  Heart attack.  Not being able to have children (infertility).  Weak bones (osteoporosis) and broken bones (fractures). If you have coughing, wheezing, and shortness of breath,  those symptoms may get better when you quit. You may also get sick less often. If you are pregnant, quitting smoking can help to lower your chances of having a baby of low birth weight. What can I do to help me quit smoking? Talk with your doctor about what can help you quit smoking. Some things you can do (strategies) include:  Quitting smoking totally, instead of slowly cutting back how much you smoke over a period of time.  Going to in-person counseling. You are more likely to quit if you go to many counseling sessions.  Using resources and support systems, such as:  Online chats with a Social worker.  Phone quitlines.  Printed Furniture conservator/restorer.  Support groups or group counseling.  Text messaging programs.  Mobile phone apps or applications.  Taking medicines. Some of these medicines may have nicotine in them. If you are pregnant or breastfeeding, do not take any medicines to quit smoking unless your doctor says it is okay. Talk with your doctor about counseling or other things that can help you. Talk with your doctor about using more than one strategy at the same time, such as taking medicines while you are also going to in-person counseling. This can help make quitting easier. What things can I do to make it easier to quit? Quitting smoking might feel very hard at first, but there is a lot that you can do to make it easier.  Take these steps:  Talk to your family and friends. Ask them to support and encourage you.  Call phone quitlines, reach out to support groups, or work with a Social worker.  Ask people who smoke to not smoke around you.  Avoid places that make you want (trigger) to smoke, such as:  Bars.  Parties.  Smoke-break areas at work.  Spend time with people who do not smoke.  Lower the stress in your life. Stress can make you want to smoke. Try these things to help your stress:  Getting regular exercise.  Deep-breathing  exercises.  Yoga.  Meditating.  Doing a body scan. To do this, close your eyes, focus on one area of your body at a time from head to toe, and notice which parts of your body are tense. Try to relax the muscles in those areas.  Download or buy apps on your mobile phone or tablet that can help you stick to your quit plan. There are many free apps, such as QuitGuide from the [employer] Office manager for Disease Control and Prevention). You can find more support from smokefree.gov and other websites. This information is not intended to replace advice given to you by your health care provider. Make sure you discuss any questions you have with your health care provider. Document Released: 05/04/2009 Document Revised: 03/05/2016 Document Reviewed: 11/22/2014 Elsevier Interactive Patient Education  2017 [employer].

## 2016-09-12 NOTE — Assessment & Plan Note (Signed)
Improved Patient re-educated about  the importance of commitment to a  minimum of 150 minutes of exercise per week.  The importance of healthy food choices with portion control discussed. Encouraged to start a food diary, count calories and to consider  joining a support group. Sample diet sheets offered. Goals set by the patient for the next several months.   Weight /BMI 09/12/2016 06/26/2016 12/06/2015  WEIGHT 145 lb 12.8 oz 148 lb 0.6 oz 145 lb  HEIGHT 4\' 11"  4\' 11"  4\' 11"   BMI 29.45 kg/m2 29.9 kg/m2 29.27 kg/m2

## 2016-09-13 ENCOUNTER — Encounter: Payer: Self-pay | Admitting: Family Medicine

## 2016-09-13 LAB — MICROALBUMIN / CREATININE URINE RATIO
Creatinine, Urine: 209.5 mg/dL
Microalb Creat Ratio: 3.5 mg/g creat (ref 0.0–30.0)
Microalb, Ur: 7.3 ug/mL — ABNORMAL HIGH

## 2016-09-13 NOTE — Assessment & Plan Note (Signed)
Uncontrolled.Toradol and depo medrol administered IM in the office , current flare of shoulder and low back pain

## 2016-09-13 NOTE — Assessment & Plan Note (Signed)
Updated lab needed at/ before next visit. Jaime Murray is reminded of the importance of commitment to daily physical activity for 30 minutes or more, as able and the need to limit carbohydrate intake to 30 to 60 grams per meal to help with blood sugar control.   The need to take medication as prescribed, test blood sugar as directed, and to call between visits if there is a concern that blood sugar is uncontrolled is also discussed.   Jaime Murray is reminded of the importance of daily foot exam, annual eye examination, and good blood sugar, blood pressure and cholesterol control.  Diabetic Labs Latest Ref Rng & Units 06/25/2016 10/20/2015 05/16/2015 11/23/2014 04/21/2014  HbA1c <5.7 % 6.5(H) 7.3(H) 6.5(H) 6.7(H) 6.7(H)  Microalbumin Not estab mg/dL - - 1.1 - -  Micro/Creat Ratio <30 mcg/mg creat - - 5 - -  Chol 125 - 200 mg/dL - 176 - 151 -  HDL >=46 mg/dL - 50 - 45(L) -  Calc LDL <130 mg/dL - 100 - 83 -  Triglycerides <150 mg/dL - 131 - 115 -  Creatinine 0.50 - 1.05 mg/dL 0.60 0.55 0.59 0.54 0.61   BP/Weight 09/12/2016 06/26/2016 12/06/2015 11/17/2015 10/16/2015 09/13/2015 [id number]  Systolic BP [id number]  Diastolic BP 84 78 81 88 72 67 80  Wt. (Lbs) 145.8 148.04 145 145.2 147 140 147.4  BMI 29.45 29.9 29.27 29.31 29.67 28.26 26.95   Foot/eye exam completion dates Latest Ref Rng & Units 09/12/2016 11/23/2014  Eye Exam No Retinopathy - -  Foot Form Completion - Done Done

## 2016-09-13 NOTE — Progress Notes (Signed)
Jaime Murray     MRN: [id number]      DOB: 1956/06/05   HPI Jaime Murray is here for follow up and re-evaluation of chronic medical conditions, medication management and review of any available recent lab and radiology data.  Preventive health is updated, specifically  Cancer screening and Immunization.   Questions or concerns regarding consultations or procedures which the Jaime Murray has had in the interim are  addressed. The Jaime Murray denies any adverse reactions to current medications since the last visit.  C/o increased shoulder and low back pain radiating to left leg x 2 weeks, no inciting trauma , requests injections C/o increased social withdrawal, not suicidal or homicidal, wants zoloft increased Denies polyuria, polydipsia, blurred vision , or hypoglycemic episodes.     ROS Denies recent fever or chills. Denies sinus pressure, nasal congestion, ear pain or sore throat. Denies chest congestion, productive cough or wheezing. Denies chest pains, palpitations and leg swelling Denies abdominal pain, nausea, vomiting,diarrhea or constipation.   Denies dysuria, frequency, hesitancy or incontinence.  Denies headaches, seizures, numbness, or tingling.  Denies skin break down or rash.   PE  BP 130/84   Pulse 93   Resp 15   Ht 4\' 11"  (1.499 m)   Wt 145 lb 12.8 oz (66.1 kg)   SpO2 94%   BMI 29.45 kg/m   Patient alert and oriented and in no cardiopulmonary distress.  HEENT: No facial asymmetry, EOMI,   oropharynx pink and moist.  Neck supple no JVD, no mass.  Chest: Clear to auscultation bilaterally.  CVS: S1, S2 no murmurs, no S3.Regular rate.  ABD: Soft non tender.   Ext: No edema  [id number]  ROM spine and  shoulder, normal in hips and knees.  Skin: Intact, no ulcerations or rash noted.  Psych: Good eye contact, normal affect. Memory intact not anxious or depressed appearing.  CNS: CN 2-12 intact, power,  normal throughout.no focal deficits noted.   Assessment &  Plan  Hypertension goal BP (blood pressure) < 130/80 Controlled, no change in medication DASH diet and commitment to daily physical activity for a minimum of 30 minutes discussed and encouraged, as a part of hypertension management. The importance of attaining a healthy weight is also discussed.  BP/Weight 09/12/2016 06/26/2016 12/06/2015 11/17/2015 10/16/2015 09/13/2015 [id number]  Systolic BP [id number] [id number] 123456 [id number]  Diastolic BP 84 78 81 88 72 67 80  Wt. (Lbs) 145.8 148.04 145 145.2 147 140 147.4  BMI 29.45 29.9 29.27 29.31 29.67 28.26 26.95       NICOTINE ADDICTION Patient counseled for approximately 5 minutes regarding the health risks of ongoing nicotine use, specifically all types of cancer, heart disease, stroke and respiratory failure. The options available for help with cessation ,the behavioral changes to assist the process, and the option to either gradully reduce usage  Or abruptly stop.is also discussed. Jaime Murray is also encouraged to set specific goals in number of cigarettes used daily, as well as to set a quit date.     Overweight (BMI 25.0-29.9) Improved Patient re-educated about  the importance of commitment to a  minimum of 150 minutes of exercise per week.  The importance of healthy food choices with portion control discussed. Encouraged to start a food diary, count calories and to consider  joining a support group. Sample diet sheets offered. Goals set by the patient for the next several months.   Weight /BMI 09/12/2016 06/26/2016 12/06/2015  WEIGHT 145 lb 12.8 oz 148 lb  0.6 oz 145 lb  HEIGHT 4\' 11"  4\' 11"  4\' 11"   BMI 29.45 kg/m2 29.9 kg/m2 29.27 kg/m2      Sleep disorder breathing Again discussed need for sleep study and ttreatmentg Jaime Murray refuses still, thogh symptomatic  Depression, recurrent (HCC) Increased Depression symptoms in past 6 months, may have SAD component. Not suicidal or homicidal , increase dose of zoloft  DM (diabetes mellitus) Updated lab  needed at/ before next visit. Jaime Murray is reminded of the importance of commitment to daily physical activity for 30 minutes or more, as able and the need to limit carbohydrate intake to 30 to 60 grams per meal to help with blood sugar control.   The need to take medication as prescribed, test blood sugar as directed, and to call between visits if there is a concern that blood sugar is uncontrolled is also discussed.   Jaime Murray is reminded of the importance of daily foot exam, annual eye examination, and good blood sugar, blood pressure and cholesterol control.  Diabetic Labs Latest Ref Rng & Units 06/25/2016 10/20/2015 05/16/2015 11/23/2014 04/21/2014  HbA1c <5.7 % 6.5(H) 7.3(H) 6.5(H) 6.7(H) 6.7(H)  Microalbumin Not estab mg/dL - - 1.1 - -  Micro/Creat Ratio <30 mcg/mg creat - - 5 - -  Chol 125 - 200 mg/dL - 176 - 151 -  HDL >=46 mg/dL - 50 - 45(L) -  Calc LDL <130 mg/dL - 100 - 83 -  Triglycerides <150 mg/dL - 131 - 115 -  Creatinine 0.50 - 1.05 mg/dL 0.60 0.55 0.59 0.54 0.61   BP/Weight 09/12/2016 06/26/2016 12/06/2015 11/17/2015 10/16/2015 09/13/2015 [id number]  Systolic BP [id number] 0000000 [id number] 123456 123456  Diastolic BP 84 78 81 88 72 67 80  Wt. (Lbs) 145.8 148.04 145 145.2 147 140 147.4  BMI 29.45 29.9 29.27 29.31 29.67 28.26 26.95   Foot/eye exam completion dates Latest Ref Rng & Units 09/12/2016 11/23/2014  Eye Exam No Retinopathy - -  Foot Form Completion - Done Done        Generalized osteoarthritis Uncontrolled.Toradol and depo medrol administered IM in the office , current flare of shoulder and low back pain  Need for 23-polyvalent pneumococcal polysaccharide vaccine After obtaining informed consent, the vaccine is  administered by LPN.

## 2016-09-13 NOTE — Assessment & Plan Note (Signed)
After obtaining informed consent, the vaccine is  administered by LPN.  

## 2016-10-17 ENCOUNTER — Other Ambulatory Visit: Payer: Self-pay | Admitting: Family Medicine

## 2016-10-22 ENCOUNTER — Other Ambulatory Visit: Payer: Self-pay

## 2016-10-22 MED ORDER — LOVASTATIN 40 MG PO TABS: 40.0000 mg | ORAL_TABLET | Freq: Every day | ORAL | 1 refills | Status: DC

## 2016-10-22 MED ORDER — CLONIDINE HCL 0.1 MG PO TABS: 0.1000 mg | ORAL_TABLET | Freq: Every day | ORAL | 1 refills | Status: DC

## 2016-11-28 LAB — CBC
HCT: 39.6 % (ref 35.0–45.0)
HEMOGLOBIN: 13.4 g/dL (ref 11.7–15.5)
MCH: 28.4 pg (ref 27.0–33.0)
MCHC: 33.8 g/dL (ref 32.0–36.0)
MCV: 83.9 fL (ref 80.0–100.0)
MPV: 11.9 fL (ref 7.5–12.5)
PLATELETS: 241 10*3/uL (ref 140–400)
RBC: 4.72 MIL/uL (ref 3.80–5.10)
RDW: 13.5 % (ref 11.0–15.0)
WBC: 7.8 10*3/uL (ref 3.8–10.8)

## 2016-11-28 LAB — COMPLETE METABOLIC PANEL WITH GFR
ALT: 16 U/L (ref 6–29)
AST: 20 U/L (ref 10–35)
Albumin: 3.7 g/dL (ref 3.6–5.1)
Alkaline Phosphatase: 70 U/L (ref 33–130)
BILIRUBIN TOTAL: 0.4 mg/dL (ref 0.2–1.2)
BUN: 9 mg/dL (ref 7–25)
CHLORIDE: 102 mmol/L (ref 98–110)
CO2: 28 mmol/L (ref 20–31)
Calcium: 9.1 mg/dL (ref 8.6–10.4)
Creat: 0.69 mg/dL (ref 0.50–1.05)
Glucose, Bld: 108 mg/dL — ABNORMAL HIGH (ref 65–99)
Potassium: 3.5 mmol/L (ref 3.5–5.3)
Sodium: 141 mmol/L (ref 135–146)
TOTAL PROTEIN: 6.5 g/dL (ref 6.1–8.1)

## 2016-11-28 LAB — LIPID PANEL
Cholesterol: 192 mg/dL (ref <200)
HDL: 68 mg/dL (ref >50)
LDL CALC: 104 mg/dL — AB (ref <100)
Total CHOL/HDL Ratio: 2.8 Ratio (ref <5.0)
Triglycerides: 99 mg/dL (ref <150)
VLDL: 20 mg/dL (ref <30)

## 2016-11-29 LAB — HEMOGLOBIN A1C
Hgb A1c MFr Bld: 6.2 % — ABNORMAL HIGH (ref <5.7)
MEAN PLASMA GLUCOSE: 131 mg/dL

## 2016-12-17 ENCOUNTER — Ambulatory Visit (HOSPITAL_COMMUNITY)
Admission: RE | Admit: 2016-12-17 | Discharge: 2016-12-17 | Disposition: A | Discharge status: Home / Self Care | Source: Ambulatory Visit | Attending: Family Medicine | Admitting: Family Medicine

## 2016-12-17 ENCOUNTER — Ambulatory Visit (INDEPENDENT_AMBULATORY_CARE_PROVIDER_SITE_OTHER): Admitting: Family Medicine

## 2016-12-17 ENCOUNTER — Encounter: Payer: Self-pay | Admitting: Family Medicine

## 2016-12-17 VITALS — BP 118/80 | HR 89 | Resp 16 | Ht 59.0 in | Wt 144.0 lb

## 2016-12-17 DIAGNOSIS — M545 Low back pain, unspecified: Secondary | ICD-10-CM

## 2016-12-17 DIAGNOSIS — M1288 Other specific arthropathies, not elsewhere classified, other specified site: Secondary | ICD-10-CM | POA: Diagnosis not present

## 2016-12-17 DIAGNOSIS — L309 Dermatitis, unspecified: Secondary | ICD-10-CM

## 2016-12-17 DIAGNOSIS — Z1211 Encounter for screening for malignant neoplasm of colon: Secondary | ICD-10-CM

## 2016-12-17 DIAGNOSIS — F17200 Nicotine dependence, unspecified, uncomplicated: Secondary | ICD-10-CM | POA: Diagnosis not present

## 2016-12-17 DIAGNOSIS — Z0000 Encounter for general adult medical examination without abnormal findings: Secondary | ICD-10-CM

## 2016-12-17 LAB — POC HEMOCCULT BLD/STL (OFFICE/1-CARD/DIAGNOSTIC): FECAL OCCULT BLD: NEGATIVE

## 2016-12-17 MED ORDER — HYDROXYZINE HCL 50 MG PO TABS: ORAL_TABLET | ORAL | 1 refills | Status: DC

## 2016-12-17 MED ORDER — GABAPENTIN 100 MG PO CAPS: 100.0000 mg | ORAL_CAPSULE | Freq: Every day | ORAL | 3 refills | Status: DC

## 2016-12-17 NOTE — Progress Notes (Signed)
Jaime Murray     MRN: [id number]      DOB: 09/03/1955  HPI: Patient is in for annual physical exam. No other health concerns are expressed or addressed at the visit. Recent labs, if available are reviewed. Immunization is reviewed , and  updated if needed.   PE: BP 118/80   Pulse 89   Resp 16   Ht 4\' 11"  (1.499 m)   Wt 144 lb (65.3 kg)   SpO2 95%   BMI 29.08 kg/m   Pleasant  female, alert and oriented x 3, in no cardio-pulmonary distress. Afebrile. HEENT No facial trauma or asymetry. Sinuses non tender.  Extra occullar muscles intact, pupils equally reactive to light. External ears normal, tympanic membranes clear. Oropharynx moist, no exudate. Neck: supple, no adenopathy,JVD or thyromegaly.No bruits.  Chest: Clear to ascultation bilaterally.No crackles or wheezes. Non tender to palpation  Breast: No asymetry,no masses or lumps. No tenderness. No nipple discharge or inversion. No axillary or supraclavicular adenopathy  Cardiovascular system; Heart sounds normal,  S1 and  S2 ,no S3.  No murmur, or thrill. Apical beat not displaced Peripheral pulses normal.  Abdomen: Soft, non tender, no organomegaly or masses. No bruits. Bowel sounds normal. No guarding, tenderness or rebound.  Rectal:  Normal sphincter tone. No rectal mass. Guaiac negative stool.  GU: External genitalia normal female genitalia , normal female distribution of hair. No lesions. Urethral meatus normal in size, no  Prolapse, no lesions visibly  Present. Bladder non tender. Vagina pink and moist , with no visible lesions , discharge present . Adequate pelvic support no  cystocele or rectocele noted Cervix pink and appears healthy, no lesions or ulcerations noted, no discharge noted from os Uterus normal size, no adnexal masses, no cervical motion or adnexal tenderness.   Musculoskeletal exam: Full ROM of spine, hips , shoulders and knees. No deformity ,swelling or crepitus  noted. No muscle wasting or atrophy.   Neurologic: Cranial nerves 2 to 12 intact. Power, tone ,sensation and reflexes normal throughout. No disturbance in gait. No tremor.  Skin: Intact, no ulceration, erythema , scaling or rash noted. Pigmentation normal throughout  Psych; Normal mood and affect. Judgement and concentration normal   Annual physical exam Annual exam as documented. Counseling done  re healthy lifestyle involving commitment to 150 minutes exercise per week, heart healthy diet, and attaining healthy weight.The importance of adequate sleep also discussed. Regular seat belt use and home safety, is also discussed. Changes in health habits are decided on by the patient with goals and time frames  set for achieving them. Immunization and cancer screening needs are specifically addressed at this visit.   Lumbar pain Increased in past 2 years, sometimes disturbs sleep, rept x ray and start gabapentin at bedtime  NICOTINE ADDICTION Patient is asked and  confirms current  Nicotine use.  Five to seven minutes of time is spent in counseling the patient of the need to quit smoking  Advice to quit is delivered clearly specifically in reducing the risk of developing heart disease, having a stroke, or of developing all types of cancer, especially lung and oral cancer. Improvement in breathing and exercise tolerance and quality of life is also discussed, as is the economic benefit.  Assessment of willingness to quit or to make an attempt to quit is made and documented, unwilling to quit, willing to cut back  Assistance in quit attempt is made with several and varied options presented, based on patient's desire and need.  These include  literature, local classes available, 1800 QUIT NOW number, OTC and prescription medication.  The GOAL to be NICOTINE FREE is re emphasized.  The patient has set a personal goal of either reduction or discontinuation and follow up is arranged  between 6 an 16 weeks.  Personal  Goal set is reducing from 5 to 3 cigarettes oer day by December

## 2016-12-17 NOTE — Assessment & Plan Note (Signed)
Increased in past 2 years, sometimes disturbs sleep, rept x ray and start gabapentin at bedtime

## 2016-12-17 NOTE — Assessment & Plan Note (Signed)
Patient is asked and  confirms current  Nicotine use.  Five to seven minutes of time is spent in counseling the patient of the need to quit smoking  Advice to quit is delivered clearly specifically in reducing the risk of developing heart disease, having a stroke, or of developing all types of cancer, especially lung and oral cancer. Improvement in breathing and exercise tolerance and quality of life is also discussed, as is the economic benefit.  Assessment of willingness to quit or to make an attempt to quit is made and documented, unwilling to quit, willing to cut back  Assistance in quit attempt is made with several and varied options presented, based on patient's desire and need. These include  literature, local classes available, 1800 QUIT NOW number, OTC and prescription medication.  The GOAL to be NICOTINE FREE is re emphasized.  The patient has set a personal goal of either reduction or discontinuation and follow up is arranged between 6 an 16 weeks.  Personal  Goal set is reducing from 5 to 3 cigarettes oer day by December

## 2016-12-17 NOTE — Patient Instructions (Addendum)
F/u early December, call if you need me before  Goal of at most 3 cigarettes daily in December, current is 5  Labs are improved except bad cholesterol  Fasting lipid, cmp and EGFr, hBa1C, TSH and vit D end November, call in October for lab sheet  Stop iron supplement  Please schedule and get eye exam  Please get x ry of low back today and new is gabapentin one at bedtime   It is important that you exercise regularly at least 30 minutes 5 times a week. If you develop chest pain, have severe difficulty breathing, or feel very tired, stop exercising immediately and seek medical attention    Thank you  for choosing [facility]. We consider it a privelige to serve you.  Delivering excellent health care in a caring and  compassionate way is our goal.  Partnering with you,  so that together we can achieve this goal is our strategy.      Steps to Quit Smoking Smoking tobacco can be bad for your health. It can also affect almost every organ in your body. Smoking puts you and people around you at risk for many serious long-lasting (chronic) diseases. Quitting smoking is hard, but it is one of the best things that you can do for your health. It is never too late to quit. What are the benefits of quitting smoking? When you quit smoking, you lower your risk for getting serious diseases and conditions. They can include:  Lung cancer or lung disease.  Heart disease.  Stroke.  Heart attack.  Not being able to have children (infertility).  Weak bones (osteoporosis) and broken bones (fractures). If you have coughing, wheezing, and shortness of breath, those symptoms may get better when you quit. You may also get sick less often. If you are pregnant, quitting smoking can help to lower your chances of having a baby of low birth weight. What can I do to help me quit smoking? Talk with your doctor about what can help you quit smoking. Some things you can do (strategies)  include:  Quitting smoking totally, instead of slowly cutting back how much you smoke over a period of time.  Going to in-person counseling. You are more likely to quit if you go to many counseling sessions.  Using resources and support systems, such as:  Online chats with a Social worker.  Phone quitlines.  Printed Furniture conservator/restorer.  Support groups or group counseling.  Text messaging programs.  Mobile phone apps or applications.  Taking medicines. Some of these medicines may have nicotine in them. If you are pregnant or breastfeeding, do not take any medicines to quit smoking unless your doctor says it is okay. Talk with your doctor about counseling or other things that can help you. Talk with your doctor about using more than one strategy at the same time, such as taking medicines while you are also going to in-person counseling. This can help make quitting easier. What things can I do to make it easier to quit? Quitting smoking might feel very hard at first, but there is a lot that you can do to make it easier. Take these steps:  Talk to your family and friends. Ask them to support and encourage you.  Call phone quitlines, reach out to support groups, or work with a Social worker.  Ask people who smoke to not smoke around you.  Avoid places that make you want (trigger) to smoke, such as:  Bars.  Parties.  Smoke-break areas at  work.  Spend time with people who do not smoke.  Lower the stress in your life. Stress can make you want to smoke. Try these things to help your stress:  Getting regular exercise.  Deep-breathing exercises.  Yoga.  Meditating.  Doing a body scan. To do this, close your eyes, focus on one area of your body at a time from head to toe, and notice which parts of your body are tense. Try to relax the muscles in those areas.  Download or buy apps on your mobile phone or tablet that can help you stick to your quit plan. There are many free apps, such  as QuitGuide from the [employer] Office manager for Disease Control and Prevention). You can find more support from smokefree.gov and other websites. This information is not intended to replace advice given to you by your health care provider. Make sure you discuss any questions you have with your health care provider. Document Released: 05/04/2009 Document Revised: 03/05/2016 Document Reviewed: 11/22/2014 Elsevier Interactive Patient Education  2017 [employer].

## 2016-12-17 NOTE — Addendum Note (Signed)
Addended by: Eual Fines on: 12/17/2016 02:07 PM   Modules accepted: Orders

## 2016-12-17 NOTE — Assessment & Plan Note (Signed)

## 2017-01-06 ENCOUNTER — Telehealth: Payer: Self-pay | Admitting: *Deleted

## 2017-01-06 NOTE — Telephone Encounter (Signed)
Patient called stating she can't take the gabapentin, patient states it makes her swimmy headed. Please advise [phone]

## 2017-01-06 NOTE — Telephone Encounter (Signed)
Patient has left a message on nurses line requesting a call back [phone]

## 2017-01-08 NOTE — Telephone Encounter (Signed)
Called patient back and left message to call back and that dr simpson was out until Monday but to call back and we could discuss her issue

## 2017-01-08 NOTE — Telephone Encounter (Signed)
Patient called twice and left two messages on the nurse line. Please call patient back.

## 2017-01-10 NOTE — Telephone Encounter (Signed)
States she has tried to take the gabapentin 2 times and each time she does the next day she feels drunk and is very off balance. She stopped it and has been fine since. Thinks it too strong for her and wants to know if there is something different she can take. Please advise [phone]

## 2017-01-10 NOTE — Telephone Encounter (Signed)
Called and left message to call back- we keep missing each other

## 2017-01-13 NOTE — Telephone Encounter (Signed)
I recommend ES tylenol , 500 mg, or tylenol PM one at bedtime

## 2017-01-17 NOTE — Telephone Encounter (Signed)
pt aware 

## 2017-03-26 ENCOUNTER — Telehealth: Payer: Self-pay | Admitting: Family Medicine

## 2017-03-26 DIAGNOSIS — E119 Type 2 diabetes mellitus without complications: Secondary | ICD-10-CM

## 2017-03-26 MED ORDER — BENAZEPRIL HCL 5 MG PO TABS: 5.0000 mg | ORAL_TABLET | Freq: Every day | ORAL | 0 refills | Status: DC

## 2017-03-26 NOTE — Telephone Encounter (Signed)
Patient left message for refill of benazepril

## 2017-04-07 ENCOUNTER — Other Ambulatory Visit: Payer: Self-pay | Admitting: Family Medicine

## 2017-04-07 NOTE — Telephone Encounter (Signed)
Seen [phone] 

## 2017-04-15 ENCOUNTER — Other Ambulatory Visit: Payer: Self-pay | Admitting: Family Medicine

## 2017-04-18 ENCOUNTER — Ambulatory Visit (INDEPENDENT_AMBULATORY_CARE_PROVIDER_SITE_OTHER)

## 2017-04-18 DIAGNOSIS — Z23 Encounter for immunization: Secondary | ICD-10-CM | POA: Diagnosis not present

## 2017-05-07 ENCOUNTER — Telehealth: Payer: Self-pay | Admitting: Family Medicine

## 2017-05-07 DIAGNOSIS — I10 Essential (primary) hypertension: Secondary | ICD-10-CM

## 2017-05-07 DIAGNOSIS — E119 Type 2 diabetes mellitus without complications: Secondary | ICD-10-CM

## 2017-05-07 DIAGNOSIS — E785 Hyperlipidemia, unspecified: Secondary | ICD-10-CM

## 2017-05-07 MED ORDER — CLONIDINE HCL 0.1 MG PO TABS: 0.1000 mg | ORAL_TABLET | Freq: Every day | ORAL | 1 refills | Status: DC

## 2017-05-07 MED ORDER — HYDROCHLOROTHIAZIDE 25 MG PO TABS: 25.0000 mg | ORAL_TABLET | Freq: Every day | ORAL | 0 refills | Status: DC

## 2017-05-07 NOTE — Telephone Encounter (Signed)
As on summary sheet, fasting lipid, cmp and EGFR HBA1C, tSH, vit D

## 2017-05-07 NOTE — Telephone Encounter (Signed)
PATIENT IS REQUESTING A REFILL FOR CLONIDINE AND HYDROCHLOROTHIAZIDE  [facility] . CB#: [phone]

## 2017-05-07 NOTE — Telephone Encounter (Signed)
Per last AVS patient is to call for labs order this month. She came into office to request them. Please advise.

## 2017-05-09 NOTE — Telephone Encounter (Signed)
Left message

## 2017-06-05 LAB — COMPLETE METABOLIC PANEL WITH GFR
AG RATIO: 1.3 (calc) (ref 1.0–2.5)
ALKALINE PHOSPHATASE (APISO): 78 U/L (ref 33–130)
ALT: 18 U/L (ref 6–29)
AST: 19 U/L (ref 10–35)
Albumin: 3.9 g/dL (ref 3.6–5.1)
BILIRUBIN TOTAL: 0.3 mg/dL (ref 0.2–1.2)
BUN: 8 mg/dL (ref 7–25)
CALCIUM: 9.3 mg/dL (ref 8.6–10.4)
CO2: 28 mmol/L (ref 20–32)
Chloride: 104 mmol/L (ref 98–110)
Creat: 0.55 mg/dL (ref 0.50–1.05)
GFR, EST NON AFRICAN AMERICAN: 103 mL/min/{1.73_m2} (ref >=60)
GFR, Est African American: 119 mL/min/{1.73_m2} (ref >=60)
GLOBULIN: 2.9 g/dL (ref 1.9–3.7)
Glucose, Bld: 110 mg/dL (ref 65–139)
POTASSIUM: 3.7 mmol/L (ref 3.5–5.3)
SODIUM: 142 mmol/L (ref 135–146)
Total Protein: 6.8 g/dL (ref 6.1–8.1)

## 2017-06-05 LAB — VITAMIN D 25 HYDROXY (VIT D DEFICIENCY, FRACTURES): VIT D 25 HYDROXY: 33 ng/mL (ref 30–100)

## 2017-06-05 LAB — LIPID PANEL
CHOL/HDL RATIO: 3 (calc) (ref <5.0)
CHOLESTEROL: 201 mg/dL — AB (ref <200)
HDL: 66 mg/dL (ref >50)
LDL CHOLESTEROL (CALC): 109 mg/dL — AB
NON-HDL CHOLESTEROL (CALC): 135 mg/dL — AB (ref <130)
TRIGLYCERIDES: 148 mg/dL (ref <150)

## 2017-06-05 LAB — HEMOGLOBIN A1C
HEMOGLOBIN A1C: 6 %{Hb} — AB (ref <5.7)
MEAN PLASMA GLUCOSE: 126 (calc)
eAG (mmol/L): 7 (calc)

## 2017-06-05 LAB — TSH: TSH: 3.28 mIU/L (ref 0.40–4.50)

## 2017-06-23 ENCOUNTER — Ambulatory Visit (INDEPENDENT_AMBULATORY_CARE_PROVIDER_SITE_OTHER): Admitting: Family Medicine

## 2017-06-23 ENCOUNTER — Encounter: Payer: Self-pay | Admitting: Family Medicine

## 2017-06-23 VITALS — BP 120/78 | HR 82 | Resp 16 | Ht 59.0 in | Wt 143.0 lb

## 2017-06-23 DIAGNOSIS — M545 Low back pain, unspecified: Secondary | ICD-10-CM

## 2017-06-23 DIAGNOSIS — I10 Essential (primary) hypertension: Secondary | ICD-10-CM | POA: Diagnosis not present

## 2017-06-23 DIAGNOSIS — F17200 Nicotine dependence, unspecified, uncomplicated: Secondary | ICD-10-CM

## 2017-06-23 DIAGNOSIS — E119 Type 2 diabetes mellitus without complications: Secondary | ICD-10-CM

## 2017-06-23 MED ORDER — BENAZEPRIL HCL 5 MG PO TABS: 5.0000 mg | ORAL_TABLET | Freq: Every day | ORAL | 1 refills | Status: DC

## 2017-06-23 MED ORDER — KETOROLAC TROMETHAMINE 60 MG/2ML IM SOLN
60.0000 mg | Freq: Once | INTRAMUSCULAR | Status: AC
  Administered 2017-06-23: 60 mg via INTRAMUSCULAR

## 2017-06-23 MED ORDER — CLONIDINE HCL 0.1 MG PO TABS: 0.1000 mg | ORAL_TABLET | Freq: Every day | ORAL | 1 refills | Status: DC

## 2017-06-23 MED ORDER — MELOXICAM 15 MG PO TABS: ORAL_TABLET | ORAL | 1 refills | Status: DC

## 2017-06-23 MED ORDER — HYDROCHLOROTHIAZIDE 25 MG PO TABS: 25.0000 mg | ORAL_TABLET | Freq: Every day | ORAL | 1 refills | Status: DC

## 2017-06-23 MED ORDER — METHYLPREDNISOLONE ACETATE 80 MG/ML IJ SUSP
80.0000 mg | Freq: Once | INTRAMUSCULAR | Status: AC
  Administered 2017-06-23: 80 mg via INTRAMUSCULAR

## 2017-06-23 NOTE — Progress Notes (Signed)
Jaime Murray     MRN: [id number]      DOB: 02-06-1956   HPI Jaime Murray is here for follow up and re-evaluation of chronic medical conditions, medication management and review of any available recent lab and radiology data.  Preventive health is updated, specifically  Cancer screening and Immunization.   Questions or concerns regarding consultations or procedures which the PT has had in the interim are  addressed. The PT states she was unable to take the gabapentin , made her heart flutter, wants something for pain. Denies polyuria, polydipsia, blurred vision , or hypoglycemic episodes. Denies polyuria, polydipsia, blurred vision , or hypoglycemic episodes.    ROS Denies recent fever or chills. Denies sinus pressure, nasal congestion, ear pain or sore throat. Denies chest congestion, productive cough or wheezing. Denies chest pains, palpitations and leg swelling Denies abdominal pain, nausea, vomiting,diarrhea or constipation.   Denies dysuria, frequency, hesitancy or incontinence. . Denies headaches, seizures, numbness, or tingling. Denies depression, anxiety or insomnia. Denies skin break down or rash.   PE  BP 120/78   Pulse 82   Resp 16   Ht 4\' 11"  (1.499 m)   Wt 143 lb (64.9 kg)   SpO2 96%   BMI 28.88 kg/m   Patient alert and oriented and in no cardiopulmonary distress.  HEENT: No facial asymmetry, EOMI,   oropharynx pink and moist.  Neck supple no JVD, no mass.  Chest: Clear to auscultation bilaterally.  CVS: S1, S2 no murmurs, no S3.Regular rate.  ABD: Soft non tender.   Ext: No edema  MS: Decreased  ROM lumbar  Spine, adequate in  shoulders, hips and knees.  Skin: Intact, no ulcerations or rash noted.  Psych: Good eye contact, normal affect. Memory intact not anxious or depressed appearing.  CNS: CN 2-12 intact, power,  normal throughout.no focal deficits noted.   Assessment & Plan  Hypertension goal BP (blood pressure) < 130/80 Controlled,  no change in medication DASH diet and commitment to daily physical activity for a minimum of 30 minutes discussed and encouraged, as a part of hypertension management. The importance of attaining a healthy weight is also discussed.  BP/Weight 06/23/2017 12/17/2016 09/12/2016 06/26/2016 12/06/2015 11/17/2015 09/10/2540  Systolic BP 706 237 628 315 176 160 737  Diastolic BP 78 80 84 78 81 88 72  Wt. (Lbs) 143 144 145.8 148.04 145 145.2 147  BMI 28.88 29.08 29.45 29.9 29.27 29.31 29.67       NICOTINE ADDICTION Patient is asked and  confirms current  Nicotine use.  Five to seven minutes of time is spent in counseling the patient of the need to quit smoking  Advice to quit is delivered clearly specifically in reducing the risk of developing heart disease, having a stroke, or of developing all types of cancer, especially lung and oral cancer. Improvement in breathing and exercise tolerance and quality of life is also discussed, as is the economic benefit.  Assessment of willingness to quit or to make an attempt to quit is made and documented  Assistance in quit attempt is made with several and varied options presented, based on patient's desire and need. These include  literature, local classes available, 1800 QUIT NOW number, OTC and prescription medication.  The GOAL to be NICOTINE FREE is re emphasized.  The patient has set a personal goal of either reduction or discontinuation and follow up is arranged between 6 an 16 weeks.  Current is 7/day  Lumbar pain Uncontrolled.Toradol and depo  medrol administered IM in the office , script sent for meloxicam 15 mg up to 3 times per week  Hyperlipidemia LDL goal <100 Hyperlipidemia:Low fat diet discussed and encouraged.   Lipid Panel  Lab Results  Component Value Date   CHOL 201 (H) 06/04/2017   HDL 66 06/04/2017   LDLCALC 104 (H) 11/28/2016   TRIG 148 06/04/2017   CHOLHDL 3.0 06/04/2017  needs to reduce fried and fatty foods

## 2017-06-23 NOTE — Patient Instructions (Addendum)
CPE and Pap , first week in June, call if you need me before  New for uncontrolled back pain is meloxicam, up to 3 times per week, one tablet.  Toradol 60 mg iM and depo medrol 80 mg IM today in office for back pain  No changes in medications you are taking  NEED to BRING MEDS to NEXT visit , there is  CONFUSION  Schedule and get mammogram no l;ater than next year April please  It is important that you exercise regularly at least 30 minutes 5 times a week. If you develop chest pain, have severe difficulty breathing, or feel very tired, stop exercising immediately and seek medical attention   Please reduce fat in your diet  It is important that you exercise regularly at least 30 minutes 5 times a week. If you develop chest pain, have severe difficulty breathing, or feel very tired, stop exercising immediately and seek medical attention   Need to schedule eye exam  Reconsider physical therapy even 2 sessions for chronic back pain   Back Exercises If you have pain in your back, do these exercises 2-3 times each day or as told by your doctor. When the pain goes away, do the exercises once each day, but repeat the steps more times for each exercise (do more repetitions). If you do not have pain in your back, do these exercises once each day or as told by your doctor. Exercises Single Knee to Chest  Do these steps 3-5 times in a row for each leg: 1. Lie on your back on a firm bed or the floor with your legs stretched out. 2. Bring one knee to your chest. 3. Hold your knee to your chest by grabbing your knee or thigh. 4. Pull on your knee until you feel a gentle stretch in your lower back. 5. Keep doing the stretch for 10-30 seconds. 6. Slowly let go of your leg and straighten it.  Pelvic Tilt  Do these steps 5-10 times in a row: 1. Lie on your back on a firm bed or the floor with your legs stretched out. 2. Bend your knees so they point up to the ceiling. Your feet should be flat on  the floor. 3. Tighten your lower belly (abdomen) muscles to press your lower back against the floor. This will make your tailbone point up to the ceiling instead of pointing down to your feet or the floor. 4. Stay in this position for 5-10 seconds while you gently tighten your muscles and breathe evenly.  Cat-Cow  Do these steps until your lower back bends more easily: 1. Get on your hands and knees on a firm surface. Keep your hands under your shoulders, and keep your knees under your hips. You may put padding under your knees. 2. Let your head hang down, and make your tailbone point down to the floor so your lower back is round like the back of a cat. 3. Stay in this position for 5 seconds. 4. Slowly lift your head and make your tailbone point up to the ceiling so your back hangs low (sags) like the back of a cow. 5. Stay in this position for 5 seconds.  Press-Ups  Do these steps 5-10 times in a row: 1. Lie on your belly (face-down) on the floor. 2. Place your hands near your head, about shoulder-width apart. 3. While you keep your back relaxed and keep your hips on the floor, slowly straighten your arms to raise the top half  of your body and lift your shoulders. Do not use your back muscles. To make yourself more comfortable, you may change where you place your hands. 4. Stay in this position for 5 seconds. 5. Slowly return to lying flat on the floor.  Bridges  Do these steps 10 times in a row: 1. Lie on your back on a firm surface. 2. Bend your knees so they point up to the ceiling. Your feet should be flat on the floor. 3. Tighten your butt muscles and lift your butt off of the floor until your waist is almost as high as your knees. If you do not feel the muscles working in your butt and the back of your thighs, slide your feet 1-2 inches farther away from your butt. 4. Stay in this position for 3-5 seconds. 5. Slowly lower your butt to the floor, and let your butt muscles  relax.  If this exercise is too easy, try doing it with your arms crossed over your chest. Belly Crunches  Do these steps 5-10 times in a row: 1. Lie on your back on a firm bed or the floor with your legs stretched out. 2. Bend your knees so they point up to the ceiling. Your feet should be flat on the floor. 3. Cross your arms over your chest. 4. Tip your chin a little bit toward your chest but do not bend your neck. 5. Tighten your belly muscles and slowly raise your chest just enough to lift your shoulder blades a tiny bit off of the floor. 6. Slowly lower your chest and your head to the floor.  Back Lifts Do these steps 5-10 times in a row: 1. Lie on your belly (face-down) with your arms at your sides, and rest your forehead on the floor. 2. Tighten the muscles in your legs and your butt. 3. Slowly lift your chest off of the floor while you keep your hips on the floor. Keep the back of your head in line with the curve in your back. Look at the floor while you do this. 4. Stay in this position for 3-5 seconds. 5. Slowly lower your chest and your face to the floor.  Contact a doctor if:  Your back pain gets a lot worse when you do an exercise.  Your back pain does not lessen 2 hours after you exercise. If you have any of these problems, stop doing the exercises. Do not do them again unless your doctor says it is okay. Get help right away if:  You have sudden, very bad back pain. If this happens, stop doing the exercises. Do not do them again unless your doctor says it is okay. This information is not intended to replace advice given to you by your health care provider. Make sure you discuss any questions you have with your health care provider. Document Released: 08/10/2010 Document Revised: 12/14/2015 Document Reviewed: 09/01/2014 Elsevier Interactive Patient Education  2018 [employer].  Steps to Quit Smoking, now at 6 per day Smoking tobacco can be bad for your health. It  can also affect almost every organ in your body. Smoking puts you and people around you at risk for many serious long-lasting (chronic) diseases. Quitting smoking is hard, but it is one of the best things that you can do for your health. It is never too late to quit. What are the benefits of quitting smoking? When you quit smoking, you lower your risk for getting serious diseases and conditions. They can include:  Lung cancer or lung disease.  Heart disease.  Stroke.  Heart attack.  Not being able to have children (infertility).  Weak bones (osteoporosis) and broken bones (fractures).  If you have coughing, wheezing, and shortness of breath, those symptoms may get better when you quit. You may also get sick less often. If you are pregnant, quitting smoking can help to lower your chances of having a baby of low birth weight. What can I do to help me quit smoking? Talk with your doctor about what can help you quit smoking. Some things you can do (strategies) include:  Quitting smoking totally, instead of slowly cutting back how much you smoke over a period of time.  Going to in-person counseling. You are more likely to quit if you go to many counseling sessions.  Using resources and support systems, such as: ? Database administrator with a Social worker. ? Phone quitlines. ? Careers information officer. ? Support groups or group counseling. ? Text messaging programs. ? Mobile phone apps or applications.  Taking medicines. Some of these medicines may have nicotine in them. If you are pregnant or breastfeeding, do not take any medicines to quit smoking unless your doctor says it is okay. Talk with your doctor about counseling or other things that can help you.  Talk with your doctor about using more than one strategy at the same time, such as taking medicines while you are also going to in-person counseling. This can help make quitting easier. What things can I do to make it easier to  quit? Quitting smoking might feel very hard at first, but there is a lot that you can do to make it easier. Take these steps:  Talk to your family and friends. Ask them to support and encourage you.  Call phone quitlines, reach out to support groups, or work with a Social worker.  Ask people who smoke to not smoke around you.  Avoid places that make you want (trigger) to smoke, such as: ? Bars. ? Parties. ? Smoke-break areas at work.  Spend time with people who do not smoke.  Lower the stress in your life. Stress can make you want to smoke. Try these things to help your stress: ? Getting regular exercise. ? Deep-breathing exercises. ? Yoga. ? Meditating. ? Doing a body scan. To do this, close your eyes, focus on one area of your body at a time from head to toe, and notice which parts of your body are tense. Try to relax the muscles in those areas.  Download or buy apps on your mobile phone or tablet that can help you stick to your quit plan. There are many free apps, such as QuitGuide from the [employer] Office manager for Disease Control and Prevention). You can find more support from smokefree.gov and other websites.  This information is not intended to replace advice given to you by your health care provider. Make sure you discuss any questions you have with your health care provider. Document Released: 05/04/2009 Document Revised: 03/05/2016 Document Reviewed: 11/22/2014 Elsevier Interactive Patient Education  2018 [employer].

## 2017-06-24 ENCOUNTER — Encounter: Payer: Self-pay | Admitting: Family Medicine

## 2017-06-24 NOTE — Assessment & Plan Note (Signed)
Hyperlipidemia:Low fat diet discussed and encouraged.   Lipid Panel  Lab Results  Component Value Date   CHOL 201 (H) 06/04/2017   HDL 66 06/04/2017   LDLCALC 104 (H) 11/28/2016   TRIG 148 06/04/2017   CHOLHDL 3.0 06/04/2017  needs to reduce fried and fatty foods

## 2017-06-24 NOTE — Assessment & Plan Note (Addendum)
Uncontrolled.Toradol and depo medrol administered IM in the office , script sent for meloxicam 15 mg up to 3 times per week

## 2017-06-24 NOTE — Assessment & Plan Note (Signed)
Controlled, no change in medication DASH diet and commitment to daily physical activity for a minimum of 30 minutes discussed and encouraged, as a part of hypertension management. The importance of attaining a healthy weight is also discussed.  BP/Weight 06/23/2017 12/17/2016 09/12/2016 06/26/2016 12/06/2015 11/17/2015 07/30/3233  Systolic BP 573 [phone]  Diastolic BP 78 80 84 78 81 88 72  Wt. (Lbs) 143 144 145.8 148.04 145 145.2 147  BMI 28.88 29.08 29.45 29.9 29.27 29.31 29.67

## 2017-06-24 NOTE — Assessment & Plan Note (Signed)
Patient is asked and  confirms current  Nicotine use.  Five to seven minutes of time is spent in counseling the patient of the need to quit smoking  Advice to quit is delivered clearly specifically in reducing the risk of developing heart disease, having a stroke, or of developing all types of cancer, especially lung and oral cancer. Improvement in breathing and exercise tolerance and quality of life is also discussed, as is the economic benefit.  Assessment of willingness to quit or to make an attempt to quit is made and documented  Assistance in quit attempt is made with several and varied options presented, based on patient's desire and need. These include  literature, local classes available, 1800 QUIT NOW number, OTC and prescription medication.  The GOAL to be NICOTINE FREE is re emphasized.  The patient has set a personal goal of either reduction or discontinuation and follow up is arranged between 6 an 16 weeks.  Current is 7/day

## 2017-08-28 ENCOUNTER — Telehealth: Payer: Self-pay | Admitting: Family Medicine

## 2017-08-28 ENCOUNTER — Other Ambulatory Visit: Payer: Self-pay

## 2017-08-28 MED ORDER — METFORMIN HCL 500 MG PO TABS: 500.0000 mg | ORAL_TABLET | Freq: Every day | ORAL | 0 refills | Status: DC

## 2017-08-28 NOTE — Telephone Encounter (Signed)
Patient left message on nurse line asking about diabetic medication, through the mail, has not received anything. Requests call back.

## 2017-08-28 NOTE — Telephone Encounter (Signed)
This was sent in January and again today

## 2017-10-03 ENCOUNTER — Other Ambulatory Visit: Payer: Self-pay | Admitting: Family Medicine

## 2017-10-12 ENCOUNTER — Other Ambulatory Visit: Payer: Self-pay | Admitting: Family Medicine

## 2017-11-11 ENCOUNTER — Telehealth: Payer: Self-pay

## 2017-11-11 NOTE — Telephone Encounter (Signed)
Patient states she recently found some meds she had misplaced so she has been without them for a long time. Wants to know if she is supposed to be taking benazepril and hydroxyzine. If so she wants them sent to [facility]. I don't see benazepril on her active list anymore. Please advise

## 2017-11-12 NOTE — Telephone Encounter (Signed)
Will not refill either, please let her know

## 2017-11-13 NOTE — Telephone Encounter (Signed)
Pt aware.

## 2017-12-19 ENCOUNTER — Telehealth: Payer: Self-pay

## 2017-12-19 DIAGNOSIS — E785 Hyperlipidemia, unspecified: Secondary | ICD-10-CM

## 2017-12-19 DIAGNOSIS — E1159 Type 2 diabetes mellitus with other circulatory complications: Secondary | ICD-10-CM

## 2017-12-19 NOTE — Telephone Encounter (Signed)
Ordered labs

## 2017-12-20 LAB — LIPID PANEL
CHOLESTEROL: 220 mg/dL — AB (ref <200)
HDL: 50 mg/dL — ABNORMAL LOW (ref >50)
Non-HDL Cholesterol (Calc): 170 mg/dL (calc) — ABNORMAL HIGH (ref <130)
Total CHOL/HDL Ratio: 4.4 (calc) (ref <5.0)
Triglycerides: 485 mg/dL — ABNORMAL HIGH (ref <150)

## 2017-12-20 LAB — COMPLETE METABOLIC PANEL WITH GFR
AG RATIO: 1.4 (calc) (ref 1.0–2.5)
ALKALINE PHOSPHATASE (APISO): 77 U/L (ref 33–130)
ALT: 16 U/L (ref 6–29)
AST: 15 U/L (ref 10–35)
Albumin: 3.9 g/dL (ref 3.6–5.1)
BILIRUBIN TOTAL: 0.2 mg/dL (ref 0.2–1.2)
BUN: 7 mg/dL (ref 7–25)
CHLORIDE: 105 mmol/L (ref 98–110)
CO2: 28 mmol/L (ref 20–32)
Calcium: 9.5 mg/dL (ref 8.6–10.4)
Creat: 0.63 mg/dL (ref 0.50–1.05)
GFR, Est African American: 114 mL/min/{1.73_m2} (ref >=60)
GFR, Est Non African American: 98 mL/min/{1.73_m2} (ref >=60)
Globulin: 2.7 g/dL (calc) (ref 1.9–3.7)
Glucose, Bld: 125 mg/dL — ABNORMAL HIGH (ref 65–99)
POTASSIUM: 3.5 mmol/L (ref 3.5–5.3)
Sodium: 142 mmol/L (ref 135–146)
TOTAL PROTEIN: 6.6 g/dL (ref 6.1–8.1)

## 2017-12-20 LAB — HEMOGLOBIN A1C
EAG (MMOL/L): 7.6 (calc)
Hgb A1c MFr Bld: 6.4 % of total Hgb — ABNORMAL HIGH (ref <5.7)
Mean Plasma Glucose: 137 (calc)

## 2017-12-20 NOTE — Telephone Encounter (Signed)
Needs statin will discuss at [facility]

## 2017-12-22 ENCOUNTER — Ambulatory Visit (INDEPENDENT_AMBULATORY_CARE_PROVIDER_SITE_OTHER): Admitting: Family Medicine

## 2017-12-22 ENCOUNTER — Other Ambulatory Visit (HOSPITAL_COMMUNITY)
Admission: RE | Admit: 2017-12-22 | Discharge: 2017-12-22 | Disposition: A | Discharge status: Home / Self Care | Source: Ambulatory Visit | Attending: Family Medicine | Admitting: Family Medicine

## 2017-12-22 ENCOUNTER — Encounter: Payer: Self-pay | Admitting: Family Medicine

## 2017-12-22 VITALS — BP 118/78 | HR 84 | Resp 16 | Ht 59.0 in | Wt 143.0 lb

## 2017-12-22 DIAGNOSIS — Z1231 Encounter for screening mammogram for malignant neoplasm of breast: Secondary | ICD-10-CM | POA: Diagnosis not present

## 2017-12-22 DIAGNOSIS — F17200 Nicotine dependence, unspecified, uncomplicated: Secondary | ICD-10-CM | POA: Diagnosis not present

## 2017-12-22 DIAGNOSIS — E785 Hyperlipidemia, unspecified: Secondary | ICD-10-CM | POA: Diagnosis not present

## 2017-12-22 DIAGNOSIS — Z0000 Encounter for general adult medical examination without abnormal findings: Secondary | ICD-10-CM | POA: Diagnosis not present

## 2017-12-22 DIAGNOSIS — Z1211 Encounter for screening for malignant neoplasm of colon: Secondary | ICD-10-CM | POA: Diagnosis not present

## 2017-12-22 DIAGNOSIS — Z124 Encounter for screening for malignant neoplasm of cervix: Secondary | ICD-10-CM | POA: Insufficient documentation

## 2017-12-22 DIAGNOSIS — E1122 Type 2 diabetes mellitus with diabetic chronic kidney disease: Secondary | ICD-10-CM

## 2017-12-22 MED ORDER — BENAZEPRIL HCL 5 MG PO TABS: 5.0000 mg | ORAL_TABLET | Freq: Every day | ORAL | 3 refills | Status: DC

## 2017-12-22 NOTE — Patient Instructions (Addendum)
F/U in 6 months, call if you need me before  Please schedule mammogram at checkout   You are referred for eye exam need this, can get glasses anywhere that's cost effective for you, we will schedule Resume benazepril, sent in  Fasting lipid, cmp and EGFR and hBA1C and CBC Nov 20 or shgortly after  Please cut back on ice cream, cholesterol is too high, which increases heart disease risk Need to commit to aerobic exercise like walking and dancing for 30 minutes 5 days per week  Please reduce cigarettes to max of 3/ day, now at 5, need to [place]     Steps to Quit Smoking Smoking tobacco can be bad for your health. It can also affect almost every organ in your body. Smoking puts you and people around you at risk for many serious long-lasting (chronic) diseases. Quitting smoking is hard, but it is one of the best things that you can do for your health. It is never too late to quit. What are the benefits of quitting smoking? When you quit smoking, you lower your risk for getting serious diseases and conditions. They can include:  Lung cancer or lung disease.  Heart disease.  Stroke.  Heart attack.  Not being able to have children (infertility).  Weak bones (osteoporosis) and broken bones (fractures).  If you have coughing, wheezing, and shortness of breath, those symptoms may get better when you quit. You may also get sick less often. If you are pregnant, quitting smoking can help to lower your chances of having a baby of low birth weight. What can I do to help me quit smoking? Talk with your doctor about what can help you quit smoking. Some things you can do (strategies) include:  Quitting smoking totally, instead of slowly cutting back how much you smoke over a period of time.  Going to in-person counseling. You are more likely to quit if you go to many counseling sessions.  Using resources and support systems, such as: ? Database administrator with a Social worker. ? Phone  quitlines. ? Careers information officer. ? Support groups or group counseling. ? Text messaging programs. ? Mobile phone apps or applications.  Taking medicines. Some of these medicines may have nicotine in them. If you are pregnant or breastfeeding, do not take any medicines to quit smoking unless your doctor says it is okay. Talk with your doctor about counseling or other things that can help you.  Talk with your doctor about using more than one strategy at the same time, such as taking medicines while you are also going to in-person counseling. This can help make quitting easier. What things can I do to make it easier to quit? Quitting smoking might feel very hard at first, but there is a lot that you can do to make it easier. Take these steps:  Talk to your family and friends. Ask them to support and encourage you.  Call phone quitlines, reach out to support groups, or work with a Social worker.  Ask people who smoke to not smoke around you.  Avoid places that make you want (trigger) to smoke, such as: ? Bars. ? Parties. ? Smoke-break areas at work.  Spend time with people who do not smoke.  Lower the stress in your life. Stress can make you want to smoke. Try these things to help your stress: ? Getting regular exercise. ? Deep-breathing exercises. ? Yoga. ? Meditating. ? Doing a body scan. To do this, close your eyes, focus on one  area of your body at a time from head to toe, and notice which parts of your body are tense. Try to relax the muscles in those areas.  Download or buy apps on your mobile phone or tablet that can help you stick to your quit plan. There are many free apps, such as QuitGuide from the [employer] Office manager for Disease Control and Prevention). You can find more support from smokefree.gov and other websites.  This information is not intended to replace advice given to you by your health care provider. Make sure you discuss any questions you have with your health care  provider. Document Released: 05/04/2009 Document Revised: 03/05/2016 Document Reviewed: 11/22/2014 Elsevier Interactive Patient Education  2018 [employer].

## 2017-12-22 NOTE — Progress Notes (Signed)
    Jaime Murray     MRN: [id number]      DOB: 12/19/1955  HPI: Patient is in for annual physical exam.  Recent labs, if available are reviewed. Immunization is reviewed , and  updated if needed.   PE: BP 118/78   Pulse 84   Resp 16   Ht 4\' 11"  (1.499 m)   Wt 143 lb (64.9 kg)   SpO2 94%   BMI 28.88 kg/m   Pleasant  female, alert and oriented x 3, in no cardio-pulmonary distress. Afebrile. HEENT No facial trauma or asymetry. Sinuses non tender.  Extra occullar muscles intact, External ears normal, tympanic membranes clear. Oropharynx moist, no exudate. Neck: supple, no adenopathy,JVD or thyromegaly.No bruits.  Chest: Clear to ascultation bilaterally.No crackles or wheezes. Non tender to palpation  Breast: No asymetry,no masses or lumps. No tenderness. No nipple discharge or inversion. No axillary or supraclavicular adenopathy  Cardiovascular system; Heart sounds normal,  S1 and  S2 ,no S3.  No murmur, or thrill. Apical beat not displaced Peripheral pulses normal.  Abdomen: Soft, non tender, no organomegaly or masses. No bruits. Bowel sounds normal. No guarding, tenderness or rebound.  Rectal:  Normal sphincter tone. No rectal mass. Guaiac negative stool.  GU: External genitalia normal female genitalia , normal female distribution of hair. No lesions. Urethral meatus normal in size, no  Prolapse, no lesions visibly  Present. Bladder non tender. Vagina pink and moist , with no visible lesions , discharge present . Adequate pelvic support no  cystocele or rectocele noted Cervix pink and appears healthy, no lesions or ulcerations noted, no discharge noted from os Uterus normal size, no adnexal masses, no cervical motion or adnexal tenderness.   Musculoskeletal exam: Full ROM of spine, hips , shoulders and knees. No deformity ,swelling or crepitus noted. No muscle wasting or atrophy.   Neurologic: Cranial nerves 2 to 12 intact. Power, tone ,sensation  and reflexes normal throughout. No disturbance in gait. No tremor.  Skin: Intact, no ulceration, erythema , scaling or rash noted. Pigmentation normal throughout  Psych; Normal mood and affect. Judgement and concentration normal   Assessment & Plan:  Annual physical exam Annual exam as documented. Counseling done  re healthy lifestyle involving commitment to 150 minutes exercise per week, heart healthy diet, and attaining healthy weight.The importance of adequate sleep also discussed.  Immunization and cancer screening needs are specifically addressed at this visit.   NICOTINE ADDICTION Asked: confirms 3 to 5 cigarettes/ day Assess; not willing to quit Advise: needs to redfuce risk of cancer, heart disease and stroke Assist: educated re methods of cutting back, need to cut back, benefits of quitting and literature provided, also 1800 QUITNOW Arrange: f/u in 6 months, spent 5 mins

## 2017-12-23 ENCOUNTER — Encounter: Payer: Self-pay | Admitting: Family Medicine

## 2017-12-23 NOTE — Assessment & Plan Note (Signed)
Annual exam as documented. Counseling done  re healthy lifestyle involving commitment to 150 minutes exercise per week, heart healthy diet, and attaining healthy weight.The importance of adequate sleep also discussed.  Immunization and cancer screening needs are specifically addressed at this visit.  

## 2017-12-23 NOTE — Assessment & Plan Note (Addendum)
Asked: confirms 3 to 5 cigarettes/ day Assess; not willing to quit Advise: needs to redfuce risk of cancer, heart disease and stroke Assist: educated re methods of cutting back, need to cut back, benefits of quitting and literature provided, also 1800 QUITNOW Arrange: f/u in 6 months, spent 5 mins

## 2017-12-24 ENCOUNTER — Encounter (HOSPITAL_COMMUNITY): Payer: Self-pay

## 2017-12-24 ENCOUNTER — Ambulatory Visit (HOSPITAL_COMMUNITY)
Admission: RE | Admit: 2017-12-24 | Discharge: 2017-12-24 | Disposition: A | Discharge status: Home / Self Care | Source: Ambulatory Visit | Attending: Family Medicine | Admitting: Family Medicine

## 2017-12-24 DIAGNOSIS — Z1231 Encounter for screening mammogram for malignant neoplasm of breast: Secondary | ICD-10-CM | POA: Diagnosis not present

## 2017-12-24 LAB — CYTOLOGY - PAP
Adequacy: ABSENT
DIAGNOSIS: NEGATIVE
HPV (WINDOPATH): NOT DETECTED

## 2018-02-04 ENCOUNTER — Other Ambulatory Visit: Payer: Self-pay | Admitting: Family Medicine

## 2018-02-19 ENCOUNTER — Other Ambulatory Visit: Payer: Self-pay

## 2018-02-19 ENCOUNTER — Encounter: Payer: Self-pay | Admitting: Family Medicine

## 2018-02-19 ENCOUNTER — Ambulatory Visit (INDEPENDENT_AMBULATORY_CARE_PROVIDER_SITE_OTHER): Admitting: Family Medicine

## 2018-02-19 VITALS — BP 138/64 | HR 85 | Resp 12 | Ht 59.0 in | Wt 144.0 lb

## 2018-02-19 DIAGNOSIS — F17200 Nicotine dependence, unspecified, uncomplicated: Secondary | ICD-10-CM | POA: Diagnosis not present

## 2018-02-19 DIAGNOSIS — I10 Essential (primary) hypertension: Secondary | ICD-10-CM | POA: Diagnosis not present

## 2018-02-19 DIAGNOSIS — M25572 Pain in left ankle and joints of left foot: Secondary | ICD-10-CM | POA: Diagnosis not present

## 2018-02-19 DIAGNOSIS — M25579 Pain in unspecified ankle and joints of unspecified foot: Secondary | ICD-10-CM | POA: Insufficient documentation

## 2018-02-19 MED ORDER — RANITIDINE HCL 300 MG PO TABS: 300.0000 mg | ORAL_TABLET | Freq: Every day | ORAL | 0 refills | Status: DC

## 2018-02-19 MED ORDER — KETOROLAC TROMETHAMINE 60 MG/2ML IM SOLN
60.0000 mg | Freq: Once | INTRAMUSCULAR | Status: AC
  Administered 2018-02-19: 60 mg via INTRAMUSCULAR

## 2018-02-19 MED ORDER — METHYLPREDNISOLONE ACETATE 80 MG/ML IJ SUSP
80.0000 mg | Freq: Once | INTRAMUSCULAR | Status: AC
  Administered 2018-02-19: 80 mg via INTRAMUSCULAR

## 2018-02-19 MED ORDER — PREDNISONE 10 MG (21) PO TBPK: ORAL_TABLET | ORAL | 0 refills | Status: DC

## 2018-02-19 NOTE — Assessment & Plan Note (Addendum)
3 day h/o flare up of pain and swelling of left ankle . Needs uric acid and ESR. Toradol depo medrol and prednisone dose pack

## 2018-02-19 NOTE — Patient Instructions (Addendum)
F/U as  Before , call if you need me sooner  Toradol 60 mg and depo medrol 80 mg iM  And prednisone dose pack prescribed  Work excuse for 02/20/2018

## 2018-02-19 NOTE — Progress Notes (Signed)
   Jaime Murray     MRN: [id number]      DOB: [birth year]   HPI Jaime Murray is here with a 3 day h/o left ankle pain and swelling no trauma, also crmping in medial aspect of left inner thigh and low back pain Last flare was in March  Has been tod in the past she has rheumatoid arthritis ROS Denies recent fever or chills. Denies sinus pressure, nasal congestion, ear pain or sore throat. Denies chest congestion, productive cough or wheezing. Denies chest pains, palpitations and leg swelling Denies abdominal pain, nausea, vomiting,diarrhea or constipation.   Denies dysuria, frequency, hesitancy or incontinence. Denies headaches, seizures, numbness, or tingling. Denies depression, anxiety or insomnia. Denies skin break down or rash.   PE  BP 138/64 (BP Location: Left Arm, Patient Position: Sitting, Cuff Size: Normal)   Pulse 85   Resp 12   Ht '4\' 11"'$  (1.499 m)   Wt 144 lb (65.3 kg)   SpO2 97%   BMI 29.08 kg/m   Patient alert and oriented and in no cardiopulmonary distress.  HEENT: No facial asymmetry, EOMI,   oropharynx pink and moist.  Neck supple no JVD, no mass.  Chest: Clear to auscultation bilaterally.  CVS: S1, S2 no murmurs, no S3.Regular rate.  ABD: Soft non tender.   Ext: No edema  MS: Adequate ROM spine, shoulders, hips and knees.Left ankle is swollen and tender   Skin: Intact, no ulcerations or rash noted.  Psych: Good eye contact, normal affect. Memory intact not anxious or depressed appearing.  CNS: CN 2-12 intact, power,  normal throughout.no focal deficits noted.   Assessment & Plan  Acute left ankle pain 3 day h/o flare up of pain and swelling of left ankle . Needs uric acid and ESR. Toradol depo medrol and prednisone dose pack  Hypertension goal BP (blood pressure) < 130/80 Controlled, no change in medication DASH diet and commitment to daily physical activity for a minimum of 30 minutes discussed and encouraged, as a part of hypertension  management. The importance of attaining a healthy weight is also discussed.  BP/Weight 02/19/2018 12/22/2017 06/23/2017 12/17/2016 09/12/2016 06/26/2016 5/39/7673  Systolic BP 419 379 024 097 353 299 242  Diastolic BP 64 78 78 80 84 78 81  Wt. (Lbs) 144 143 143 144 145.8 148.04 145  BMI 29.08 28.88 28.88 29.08 29.45 29.9 29.27       NICOTINE ADDICTION Asked: confirms currently smokes  Cigarettes daily Assess: not willing to quit Advise: needs to quit to reduce cancer, stroke and heart disease Assist: counseled  For 3 mins and 1800 QUIT NOW # provided Arrange : f/U in 4 months

## 2018-02-21 LAB — SEDIMENTATION RATE: SED RATE: 56 mm/h — AB (ref 0–30)

## 2018-02-21 LAB — URIC ACID: Uric Acid, Serum: 3.9 mg/dL (ref 2.5–7.0)

## 2018-02-22 ENCOUNTER — Encounter: Payer: Self-pay | Admitting: Family Medicine

## 2018-02-22 NOTE — Assessment & Plan Note (Signed)
Asked: confirms currently smokes  Cigarettes daily Assess: not willing to quit Advise: needs to quit to reduce cancer, stroke and heart disease Assist: counseled  For 3 mins and 1800 QUIT NOW # provided Arrange : f/U in 4 months

## 2018-02-22 NOTE — Assessment & Plan Note (Signed)
Controlled, no change in medication DASH diet and commitment to daily physical activity for a minimum of 30 minutes discussed and encouraged, as a part of hypertension management. The importance of attaining a healthy weight is also discussed.  BP/Weight 02/19/2018 12/22/2017 06/23/2017 12/17/2016 09/12/2016 06/26/2016 4/44/6190  Systolic BP 122 241 146 431 427 670 110  Diastolic BP 64 78 78 80 84 78 81  Wt. (Lbs) 144 143 143 144 145.8 148.04 145  BMI 29.08 28.88 28.88 29.08 29.45 29.9 29.27

## 2018-04-01 ENCOUNTER — Other Ambulatory Visit: Payer: Self-pay | Admitting: Family Medicine

## 2018-04-10 ENCOUNTER — Other Ambulatory Visit: Payer: Self-pay | Admitting: Family Medicine

## 2018-05-13 ENCOUNTER — Other Ambulatory Visit: Payer: Self-pay

## 2018-05-13 ENCOUNTER — Telehealth: Payer: Self-pay | Admitting: Family Medicine

## 2018-05-13 MED ORDER — CLONIDINE HCL 0.1 MG PO TABS: 0.1000 mg | ORAL_TABLET | Freq: Every day | ORAL | 1 refills | Status: DC

## 2018-05-13 NOTE — Telephone Encounter (Signed)
Pt is calling she needs her Clonidine  Called to [facility]

## 2018-05-15 ENCOUNTER — Ambulatory Visit (INDEPENDENT_AMBULATORY_CARE_PROVIDER_SITE_OTHER)

## 2018-05-15 DIAGNOSIS — Z23 Encounter for immunization: Secondary | ICD-10-CM

## 2018-05-18 ENCOUNTER — Ambulatory Visit (INDEPENDENT_AMBULATORY_CARE_PROVIDER_SITE_OTHER): Admitting: Family Medicine

## 2018-05-18 ENCOUNTER — Encounter: Payer: Self-pay | Admitting: Family Medicine

## 2018-05-18 VITALS — BP 134/68 | HR 88 | Resp 12 | Ht 59.0 in | Wt 145.1 lb

## 2018-05-18 DIAGNOSIS — R7989 Other specified abnormal findings of blood chemistry: Secondary | ICD-10-CM | POA: Diagnosis not present

## 2018-05-18 DIAGNOSIS — M549 Dorsalgia, unspecified: Secondary | ICD-10-CM | POA: Diagnosis not present

## 2018-05-18 DIAGNOSIS — E119 Type 2 diabetes mellitus without complications: Secondary | ICD-10-CM | POA: Diagnosis not present

## 2018-05-18 DIAGNOSIS — E663 Overweight: Secondary | ICD-10-CM | POA: Diagnosis not present

## 2018-05-18 DIAGNOSIS — F17200 Nicotine dependence, unspecified, uncomplicated: Secondary | ICD-10-CM

## 2018-05-18 DIAGNOSIS — M545 Low back pain, unspecified: Secondary | ICD-10-CM

## 2018-05-18 DIAGNOSIS — I10 Essential (primary) hypertension: Secondary | ICD-10-CM

## 2018-05-18 MED ORDER — IBUPROFEN 800 MG PO TABS: 800.0000 mg | ORAL_TABLET | Freq: Three times a day (TID) | ORAL | 0 refills | Status: DC | PRN

## 2018-05-18 MED ORDER — METHYLPREDNISOLONE ACETATE 80 MG/ML IJ SUSP
80.0000 mg | Freq: Once | INTRAMUSCULAR | Status: AC
  Administered 2018-05-18: 80 mg via INTRAMUSCULAR

## 2018-05-18 MED ORDER — KETOROLAC TROMETHAMINE 60 MG/2ML IM SOLN
60.0000 mg | Freq: Once | INTRAMUSCULAR | Status: AC
  Administered 2018-05-18: 60 mg via INTRAMUSCULAR

## 2018-05-18 MED ORDER — PREDNISONE 5 MG (21) PO TBPK: 5.0000 mg | ORAL_TABLET | ORAL | 0 refills | Status: DC

## 2018-05-18 NOTE — Progress Notes (Signed)
Jaime Murray     MRN: [id number]      DOB: 04/25/1956   HPI Jaime Murray is here fwith a 2 month h/o worsening LBP to legs, and feet , has benefited from injections and is here for this, she has established lumbar spine disease  No red flag symptoms, no inciting trauma Denies polyuria, polydipsia, blurred vision , or hypoglycemic episodes.  ROS Denies recent fever or chills. Denies sinus pressure, nasal congestion, ear pain or sore throat. Denies chest congestion, productive cough or wheezing. Denies chest pains, palpitations and leg swelling Denies abdominal pain, nausea, vomiting,diarrhea or constipation.   Denies dysuria, frequency, hesitancy or incontinence.  Denies headaches, seizures, numbness, or tingling. Denies depression, anxiety or insomnia. Denies skin break down or rash.   PE  BP 134/68 (BP Location: Right Arm, Patient Position: Sitting, Cuff Size: Normal)   Pulse 88   Resp 12   Ht 4\' 11"  (1.499 m)   Wt 145 lb 1.9 oz (65.8 kg)   SpO2 95% Comment: room air  BMI 29.31 kg/m pt in pain  Patient alert and oriented and in no cardiopulmonary distress.  HEENT: No facial asymmetry, EOMI,   oropharynx pink and moist.  Neck supple no JVD, no mass.  Chest: Clear to auscultation bilaterally.  CVS: S1, S2 no murmurs, no S3.Regular rate.  ABD: Soft non tender.   Ext: No edema  MS: Decreased  ROM lumbar  Spine,adequate in shoulders, hips and knees.  Skin: Intact, no ulcerations or rash noted.  Psych: Good eye contact, normal affect. Memory intact not anxious or depressed appearing.  CNS: CN 2-12 intact, power,  normal throughout.no focal deficits noted.   Assessment & Plan  Lumbar pain Uncontrolled.Toradol and depo medrol administered IM in the office , to be followed by a short course of oral prednisone and NSAIDS.   Hypertension goal BP (blood pressure) < 130/80 Controlled, no change in medication' DASH diet and commitment to daily physical activity  for a minimum of 30 minutes discussed and encouraged, as a part of hypertension management. The importance of attaining a healthy weight is also discussed.  BP/Weight 05/18/2018 02/19/2018 12/22/2017 06/23/2017 12/17/2016 09/12/2016 32/09/5571  Systolic BP 220 254 270 623 762 831 517  Diastolic BP 68 64 78 78 80 84 78  Wt. (Lbs) 145.12 144 143 143 144 145.8 148.04  BMI 29.31 29.08 28.88 28.88 29.08 29.45 29.9     '  DM (diabetes mellitus) Controlled, no change in medication Jaime Murray is reminded of the importance of commitment to daily physical activity for 30 minutes or more, as able and the need to limit carbohydrate intake to 30 to 60 grams per meal to help with blood sugar control.   The need to take medication as prescribed, test blood sugar as directed, and to call between visits if there is a concern that blood sugar is uncontrolled is also discussed.   Jaime Murray is reminded of the importance of daily foot exam, annual eye examination, and good blood sugar, blood pressure and cholesterol control.  Diabetic Labs Latest Ref Rng & Units 12/19/2017 06/04/2017 11/28/2016 09/12/2016 06/25/2016  HbA1c <5.7 % of total Hgb 6.4(H) 6.0(H) 6.2(H) - 6.5(H)  Microalbumin Not Estab. ug/mL - - - 7.3(H) -  Micro/Creat Ratio 0.0 - 30.0 mg/g creat - - - 3.5 -  Chol <200 mg/dL 220(H) 201(H) 192 - -  HDL >50 mg/dL 50(L) 66 68 - -  Calc LDL mg/dL (calc) - 109(H) 104(H) - -  Triglycerides <150 mg/dL 485(H) 148 99 - -  Creatinine 0.50 - 1.05 mg/dL 0.63 0.55 0.69 - 0.60   BP/Weight 05/18/2018 02/19/2018 12/22/2017 06/23/2017 12/17/2016 09/12/2016 42/09/9530  Systolic BP 023 343 568 616 837 290 211  Diastolic BP 68 64 78 78 80 84 78  Wt. (Lbs) 145.12 144 143 143 144 145.8 148.04  BMI 29.31 29.08 28.88 28.88 29.08 29.45 29.9   Foot/eye exam completion dates Latest Ref Rng & Units 12/22/2017 09/12/2016  Eye Exam No Retinopathy - -  Foot Form Completion - Done Done      Updated lab needed at/ before next  visit.   NICOTINE ADDICTION Will work on cutting back as outlined, not willing to set quit date currently smokes 5/ day, will cut back to 3/day Assists: counseled for 5 mins, info provided and QUIT NOW # Assess: not willing to set quit date  Will cut back Ask: current 5 / day Advise: needs to quiit to reduce risk of cancer, stroke and mI Arrange f/u in 8 weeks

## 2018-05-18 NOTE — Assessment & Plan Note (Addendum)
Will work on cutting back as outlined, not willing to set quit date currently smokes 5/ day, will cut back to 3/day Assists: counseled for 5 mins, info provided and QUIT NOW # Assess: not willing to set quit date  Will cut back Ask: current 5 / day Advise: needs to quiit to reduce risk of cancer, stroke and mI Arrange f/u in 8 weeks

## 2018-05-18 NOTE — Patient Instructions (Addendum)
F/U as before ,call if you need me sooner Injections ion office today for back and lower ext pain and medications sent for 1 week only  Cut back to 3 cigarettes / day from 5 so you can quit.  Microalb from office today    Steps to Quit Smoking Smoking tobacco can be bad for your health. It can also affect almost every organ in your body. Smoking puts you and people around you at risk for many serious long-lasting (chronic) diseases. Quitting smoking is hard, but it is one of the best things that you can do for your health. It is never too late to quit. What are the benefits of quitting smoking? When you quit smoking, you lower your risk for getting serious diseases and conditions. They can include:  Lung cancer or lung disease.  Heart disease.  Stroke.  Heart attack.  Not being able to have children (infertility).  Weak bones (osteoporosis) and broken bones (fractures).  If you have coughing, wheezing, and shortness of breath, those symptoms may get better when you quit. You may also get sick less often. If you are pregnant, quitting smoking can help to lower your chances of having a baby of low birth weight. What can I do to help me quit smoking? Talk with your doctor about what can help you quit smoking. Some things you can do (strategies) include:  Quitting smoking totally, instead of slowly cutting back how much you smoke over a period of time.  Going to in-person counseling. You are more likely to quit if you go to many counseling sessions.  Using resources and support systems, such as: ? Database administrator with a Social worker. ? Phone quitlines. ? Careers information officer. ? Support groups or group counseling. ? Text messaging programs. ? Mobile phone apps or applications.  Taking medicines. Some of these medicines may have nicotine in them. If you are pregnant or breastfeeding, do not take any medicines to quit smoking unless your doctor says it is okay. Talk with your  doctor about counseling or other things that can help you.  Talk with your doctor about using more than one strategy at the same time, such as taking medicines while you are also going to in-person counseling. This can help make quitting easier. What things can I do to make it easier to quit? Quitting smoking might feel very hard at first, but there is a lot that you can do to make it easier. Take these steps:  Talk to your family and friends. Ask them to support and encourage you.  Call phone quitlines, reach out to support groups, or work with a Social worker.  Ask people who smoke to not smoke around you.  Avoid places that make you want (trigger) to smoke, such as: ? Bars. ? Parties. ? Smoke-break areas at work.  Spend time with people who do not smoke.  Lower the stress in your life. Stress can make you want to smoke. Try these things to help your stress: ? Getting regular exercise. ? Deep-breathing exercises. ? Yoga. ? Meditating. ? Doing a body scan. To do this, close your eyes, focus on one area of your body at a time from head to toe, and notice which parts of your body are tense. Try to relax the muscles in those areas.  Download or buy apps on your mobile phone or tablet that can help you stick to your quit plan. There are many free apps, such as QuitGuide from the [employer] Office manager for  Disease Control and Prevention). You can find more support from smokefree.gov and other websites.  This information is not intended to replace advice given to you by your health care provider. Make sure you discuss any questions you have with your health care provider. Document Released: 05/04/2009 Document Revised: 03/05/2016 Document Reviewed: 11/22/2014 Elsevier Interactive Patient Education  2018 [employer].

## 2018-05-18 NOTE — Assessment & Plan Note (Addendum)
Uncontrolled.Toradol and depo medrol administered IM in the office , to be followed by a short course of oral prednisone and NSAIDS.  

## 2018-05-18 NOTE — Assessment & Plan Note (Signed)
Controlled, no change in medication' DASH diet and commitment to daily physical activity for a minimum of 30 minutes discussed and encouraged, as a part of hypertension management. The importance of attaining a healthy weight is also discussed.  BP/Weight 05/18/2018 02/19/2018 12/22/2017 06/23/2017 12/17/2016 09/12/2016 56/07/5377  Systolic BP 432 761 470 929 574 734 037  Diastolic BP 68 64 78 78 80 84 78  Wt. (Lbs) 145.12 144 143 143 144 145.8 148.04  BMI 29.31 29.08 28.88 28.88 29.08 29.45 29.9     '

## 2018-05-18 NOTE — Assessment & Plan Note (Signed)
Controlled, no change in medication Jaime Murray is reminded of the importance of commitment to daily physical activity for 30 minutes or more, as able and the need to limit carbohydrate intake to 30 to 60 grams per meal to help with blood sugar control.   The need to take medication as prescribed, test blood sugar as directed, and to call between visits if there is a concern that blood sugar is uncontrolled is also discussed.   Jaime Murray is reminded of the importance of daily foot exam, annual eye examination, and good blood sugar, blood pressure and cholesterol control.  Diabetic Labs Latest Ref Rng & Units 12/19/2017 06/04/2017 11/28/2016 09/12/2016 06/25/2016  HbA1c <5.7 % of total Hgb 6.4(H) 6.0(H) 6.2(H) - 6.5(H)  Microalbumin Not Estab. ug/mL - - - 7.3(H) -  Micro/Creat Ratio 0.0 - 30.0 mg/g creat - - - 3.5 -  Chol <200 mg/dL 220(H) 201(H) 192 - -  HDL >50 mg/dL 50(L) 66 68 - -  Calc LDL mg/dL (calc) - 109(H) 104(H) - -  Triglycerides <150 mg/dL 485(H) 148 99 - -  Creatinine 0.50 - 1.05 mg/dL 0.63 0.55 0.69 - 0.60   BP/Weight 05/18/2018 02/19/2018 12/22/2017 06/23/2017 12/17/2016 09/12/2016 97/03/8920  Systolic BP 194 174 081 448 185 631 497  Diastolic BP 68 64 78 78 80 84 78  Wt. (Lbs) 145.12 144 143 143 144 145.8 148.04  BMI 29.31 29.08 28.88 28.88 29.08 29.45 29.9   Foot/eye exam completion dates Latest Ref Rng & Units 12/22/2017 09/12/2016  Eye Exam No Retinopathy - -  Foot Form Completion - Done Done      Updated lab needed at/ before next visit.

## 2018-05-20 LAB — MICROALBUMIN, URINE: Microalb, Ur: 0.5 mg/dL

## 2018-06-08 ENCOUNTER — Telehealth: Payer: Self-pay | Admitting: *Deleted

## 2018-06-08 ENCOUNTER — Other Ambulatory Visit: Payer: Self-pay

## 2018-06-08 MED ORDER — METFORMIN HCL 500 MG PO TABS: 500.0000 mg | ORAL_TABLET | Freq: Every day | ORAL | 2 refills | Status: DC

## 2018-06-08 NOTE — Telephone Encounter (Signed)
Pt needs a refill [facility]

## 2018-06-08 NOTE — Telephone Encounter (Signed)
Medication refilled and sent to requested pharmacy

## 2018-06-18 LAB — LIPID PANEL
CHOL/HDL RATIO: 2.8 (calc) (ref <5.0)
CHOLESTEROL: 205 mg/dL — AB (ref <200)
HDL: 74 mg/dL (ref >50)
LDL CHOLESTEROL (CALC): 111 mg/dL — AB
Non-HDL Cholesterol (Calc): 131 mg/dL (calc) — ABNORMAL HIGH (ref <130)
Triglycerides: 92 mg/dL (ref <150)

## 2018-06-18 LAB — COMPLETE METABOLIC PANEL WITH GFR
AG Ratio: 1.4 (calc) (ref 1.0–2.5)
ALBUMIN MSPROF: 3.9 g/dL (ref 3.6–5.1)
ALKALINE PHOSPHATASE (APISO): 80 U/L (ref 33–130)
ALT: 18 U/L (ref 6–29)
AST: 19 U/L (ref 10–35)
BUN/Creatinine Ratio: 8 (calc) (ref 6–22)
BUN: 5 mg/dL — AB (ref 7–25)
CALCIUM: 9.2 mg/dL (ref 8.6–10.4)
CO2: 28 mmol/L (ref 20–32)
CREATININE: 0.59 mg/dL (ref 0.50–0.99)
Chloride: 102 mmol/L (ref 98–110)
GFR, EST NON AFRICAN AMERICAN: 100 mL/min/{1.73_m2} (ref >=60)
GFR, Est African American: 115 mL/min/{1.73_m2} (ref >=60)
GLUCOSE: 118 mg/dL — AB (ref 65–99)
Globulin: 2.8 g/dL (calc) (ref 1.9–3.7)
Potassium: 3.7 mmol/L (ref 3.5–5.3)
Sodium: 141 mmol/L (ref 135–146)
Total Bilirubin: 0.3 mg/dL (ref 0.2–1.2)
Total Protein: 6.7 g/dL (ref 6.1–8.1)

## 2018-06-18 LAB — VITAMIN D 25 HYDROXY (VIT D DEFICIENCY, FRACTURES): Vit D, 25-Hydroxy: 33 ng/mL (ref 30–100)

## 2018-06-18 LAB — CBC
HEMATOCRIT: 40 % (ref 35.0–45.0)
Hemoglobin: 13.8 g/dL (ref 11.7–15.5)
MCH: 28.5 pg (ref 27.0–33.0)
MCHC: 34.5 g/dL (ref 32.0–36.0)
MCV: 82.6 fL (ref 80.0–100.0)
MPV: 12.4 fL (ref 7.5–12.5)
PLATELETS: 273 10*3/uL (ref 140–400)
RBC: 4.84 10*6/uL (ref 3.80–5.10)
RDW: 13.6 % (ref 11.0–15.0)
WBC: 8 10*3/uL (ref 3.8–10.8)

## 2018-06-18 LAB — HEMOGLOBIN A1C
HEMOGLOBIN A1C: 6.3 %{Hb} — AB (ref <5.7)
MEAN PLASMA GLUCOSE: 134 (calc)
eAG (mmol/L): 7.4 (calc)

## 2018-06-18 LAB — TSH: TSH: 1.88 mIU/L (ref 0.40–4.50)

## 2018-06-23 ENCOUNTER — Encounter: Payer: Self-pay | Admitting: Family Medicine

## 2018-06-23 ENCOUNTER — Ambulatory Visit (INDEPENDENT_AMBULATORY_CARE_PROVIDER_SITE_OTHER): Admitting: Family Medicine

## 2018-06-23 VITALS — BP 134/82 | HR 86 | Resp 15 | Ht 59.0 in | Wt 145.0 lb

## 2018-06-23 DIAGNOSIS — F17210 Nicotine dependence, cigarettes, uncomplicated: Secondary | ICD-10-CM | POA: Diagnosis not present

## 2018-06-23 DIAGNOSIS — I10 Essential (primary) hypertension: Secondary | ICD-10-CM

## 2018-06-23 DIAGNOSIS — F17200 Nicotine dependence, unspecified, uncomplicated: Secondary | ICD-10-CM

## 2018-06-23 DIAGNOSIS — Z23 Encounter for immunization: Secondary | ICD-10-CM | POA: Diagnosis not present

## 2018-06-23 DIAGNOSIS — E1159 Type 2 diabetes mellitus with other circulatory complications: Secondary | ICD-10-CM

## 2018-06-23 DIAGNOSIS — M159 Polyosteoarthritis, unspecified: Secondary | ICD-10-CM

## 2018-06-23 DIAGNOSIS — E785 Hyperlipidemia, unspecified: Secondary | ICD-10-CM

## 2018-06-23 MED ORDER — BENAZEPRIL HCL 5 MG PO TABS: 5.0000 mg | ORAL_TABLET | Freq: Every day | ORAL | 3 refills | Status: DC

## 2018-06-23 NOTE — Progress Notes (Signed)
Jaime Murray     MRN: [id number]      DOB: Dec 06, 1955   HPI Jaime Murray is here for follow up and re-evaluation of chronic medical conditions, medication management and review of any available recent lab and radiology data.  Preventive health is updated, specifically  Cancer screening and Immunization.   Questions or concerns regarding consultations or procedures which the PT has had in the interim are  addressed. The PT denies any adverse reactions to current medications since the last visit.  C/o generalized joint pain and deformity of digits, wants rheumatology eval, rates the pain at a 10 Denies polyuria, polydipsia, blurred vision , or hypoglycemic episodes.   ROS Denies recent fever or chills. Denies sinus pressure, nasal congestion, ear pain or sore throat. Denies chest congestion, productive cough or wheezing. Denies chest pains, palpitations and leg swelling Denies abdominal pain, nausea, vomiting,diarrhea or constipation.   Denies dysuria, frequency, hesitancy or incontinence. . Denies headaches, seizures, numbness, or tingling. Denies depression, anxiety or insomnia. Denies skin break down or rash.   PE  BP 134/82   Pulse 86   Resp 15   Ht 4\' 11"  (1.499 m)   Wt 145 lb (65.8 kg)   SpO2 94%   BMI 29.29 kg/m   Patient alert and oriented and in no cardiopulmonary distress.  HEENT: No facial asymmetry, EOMI,   oropharynx pink and moist.  Neck supple no JVD, no mass.  Chest: Clear to auscultation bilaterally.  CVS: S1, S2 no murmurs, no S3.Regular rate.  ABD: Soft non tender.   Ext: No edema  MS: Adequate though reduced  ROM spine, shoulders, hips and knees.tender over posterior neck deformity of digits noted  Skin: Intact, no ulcerations or rash noted.  Psych: Good eye contact, normal affect. Memory intact not anxious or depressed appearing.  CNS: CN 2-12 intact, power,  normal throughout.no focal deficits noted.   Assessment & Plan  Generalized  osteoarthritis Generalized joint pain and stiffness, worsening over time and debilitating, and deformity of digits, requests rheumatology evaluate rated  at a 10  Hypertension goal BP (blood pressure) < 130/80 Controlled, no change in medication DASH diet and commitment to daily physical activity for a minimum of 30 minutes discussed and encouraged, as a part of hypertension management. The importance of attaining a healthy weight is also discussed.  BP/Weight 06/23/2018 05/18/2018 02/19/2018 12/22/2017 06/23/2017 12/17/2016 9/48/5462  Systolic BP 703 500 938 182 993 716 967  Diastolic BP 82 68 64 78 78 80 84  Wt. (Lbs) 145 145.12 144 143 143 144 145.8  BMI 29.29 29.31 29.08 28.88 28.88 29.08 29.45       Hyperlipidemia LDL goal <100 Hyperlipidemia:Low fat diet discussed and encouraged.   Lipid Panel  Lab Results  Component Value Date   CHOL 205 (H) 06/17/2018   HDL 74 06/17/2018   LDLCALC 111 (H) 06/17/2018   TRIG 92 06/17/2018   CHOLHDL 2.8 06/17/2018   Not at goal, needs to reduce intake of fried and fatty foods    NICOTINE ADDICTION Asked:confirms currently smokes 5 cigarettes/ day Assess: Unwilling to quit but cutting back Advise: needs to QUIT to reduce risk of cancer, cardio and cerebrovascular disease Assist: counseled for 5 minutes and literature provided Arrange: follow up in 3 months   DM (diabetes mellitus) Controlled, no change in medication Jaime Murray is reminded of the importance of commitment to daily physical activity for 30 minutes or more, as able and the need to limit carbohydrate  intake to 30 to 60 grams per meal to help with blood sugar control.   The need to take medication as prescribed, test blood sugar as directed, and to call between visits if there is a concern that blood sugar is uncontrolled is also discussed.   Jaime Murray is reminded of the importance of daily foot exam, annual eye examination, and good blood sugar, blood pressure and  cholesterol control.  Diabetic Labs Latest Ref Rng & Units 06/17/2018 05/19/2018 12/19/2017 06/04/2017 11/28/2016  HbA1c <5.7 % of total Hgb 6.3(H) - 6.4(H) 6.0(H) 6.2(H)  Microalbumin mg/dL - 0.5 - - -  Micro/Creat Ratio 0.0 - 30.0 mg/g creat - - - - -  Chol <200 mg/dL 205(H) - 220(H) 201(H) 192  HDL >50 mg/dL 74 - 50(L) 66 68  Calc LDL mg/dL (calc) 111(H) - - 109(H) 104(H)  Triglycerides <150 mg/dL 92 - 485(H) 148 99  Creatinine 0.50 - 0.99 mg/dL 0.59 - 0.63 0.55 0.69   BP/Weight 06/23/2018 05/18/2018 02/19/2018 12/22/2017 06/23/2017 12/17/2016 5/88/5027  Systolic BP 741 287 867 672 094 709 628  Diastolic BP 82 68 64 78 78 80 84  Wt. (Lbs) 145 145.12 144 143 143 144 145.8  BMI 29.29 29.31 29.08 28.88 28.88 29.08 29.45   Foot/eye exam completion dates Latest Ref Rng & Units 12/22/2017 09/12/2016  Eye Exam No Retinopathy - -  Foot Form Completion - Done Done

## 2018-06-23 NOTE — Assessment & Plan Note (Signed)
Hyperlipidemia:Low fat diet discussed and encouraged.   Lipid Panel  Lab Results  Component Value Date   CHOL 205 (H) 06/17/2018   HDL 74 06/17/2018   LDLCALC 111 (H) 06/17/2018   TRIG 92 06/17/2018   CHOLHDL 2.8 06/17/2018   Not at goal, needs to reduce intake of fried and fatty foods

## 2018-06-23 NOTE — Assessment & Plan Note (Signed)
Controlled, no change in medication Ms. Pinegar is reminded of the importance of commitment to daily physical activity for 30 minutes or more, as able and the need to limit carbohydrate intake to 30 to 60 grams per meal to help with blood sugar control.   The need to take medication as prescribed, test blood sugar as directed, and to call between visits if there is a concern that blood sugar is uncontrolled is also discussed.   Ms. Derocher is reminded of the importance of daily foot exam, annual eye examination, and good blood sugar, blood pressure and cholesterol control.  Diabetic Labs Latest Ref Rng & Units 06/17/2018 05/19/2018 12/19/2017 06/04/2017 11/28/2016  HbA1c <5.7 % of total Hgb 6.3(H) - 6.4(H) 6.0(H) 6.2(H)  Microalbumin mg/dL - 0.5 - - -  Micro/Creat Ratio 0.0 - 30.0 mg/g creat - - - - -  Chol <200 mg/dL 205(H) - 220(H) 201(H) 192  HDL >50 mg/dL 74 - 50(L) 66 68  Calc LDL mg/dL (calc) 111(H) - - 109(H) 104(H)  Triglycerides <150 mg/dL 92 - 485(H) 148 99  Creatinine 0.50 - 0.99 mg/dL 0.59 - 0.63 0.55 0.69   BP/Weight 06/23/2018 05/18/2018 02/19/2018 12/22/2017 06/23/2017 12/17/2016 9/72/8206  Systolic BP 015 615 379 432 761 470 929  Diastolic BP 82 68 64 78 78 80 84  Wt. (Lbs) 145 145.12 144 143 143 144 145.8  BMI 29.29 29.31 29.08 28.88 28.88 29.08 29.45   Foot/eye exam completion dates Latest Ref Rng & Units 12/22/2017 09/12/2016  Eye Exam No Retinopathy - -  Foot Form Completion - Done Done

## 2018-06-23 NOTE — Patient Instructions (Addendum)
CPE and pap June 5 or shortly after , call me if you  Need me before  Excellent labs, no medication change   Shingrix #1 today, and Friday morning nurse visit only for Shingrix #2 in 2 to 3 months  Rept fasting lipid, cmp and eFGFr and HBa1C I 1 weeklast week in may , collect form today  You are referred to Rheumatology, the office will call with appt  Noe at 5 cigarettes per day, I am  Very hopeful that by next June , no more   Steps to Quit Smoking Smoking tobacco can be bad for your health. It can also affect almost every organ in your body. Smoking puts you and people around you at risk for many serious long-lasting (chronic) diseases. Quitting smoking is hard, but it is one of the best things that you can do for your health. It is never too late to quit. What are the benefits of quitting smoking? When you quit smoking, you lower your risk for getting serious diseases and conditions. They can include:  Lung cancer or lung disease.  Heart disease.  Stroke.  Heart attack.  Not being able to have children (infertility).  Weak bones (osteoporosis) and broken bones (fractures).  If you have coughing, wheezing, and shortness of breath, those symptoms may get better when you quit. You may also get sick less often. If you are pregnant, quitting smoking can help to lower your chances of having a baby of low birth weight. What can I do to help me quit smoking? Talk with your doctor about what can help you quit smoking. Some things you can do (strategies) include:  Quitting smoking totally, instead of slowly cutting back how much you smoke over a period of time.  Going to in-person counseling. You are more likely to quit if you go to many counseling sessions.  Using resources and support systems, such as: ? Database administrator with a Social worker. ? Phone quitlines. ? Careers information officer. ? Support groups or group counseling. ? Text messaging programs. ? Mobile phone apps or  applications.  Taking medicines. Some of these medicines may have nicotine in them. If you are pregnant or breastfeeding, do not take any medicines to quit smoking unless your doctor says it is okay. Talk with your doctor about counseling or other things that can help you.  Talk with your doctor about using more than one strategy at the same time, such as taking medicines while you are also going to in-person counseling. This can help make quitting easier. What things can I do to make it easier to quit? Quitting smoking might feel very hard at first, but there is a lot that you can do to make it easier. Take these steps:  Talk to your family and friends. Ask them to support and encourage you.  Call phone quitlines, reach out to support groups, or work with a Social worker.  Ask people who smoke to not smoke around you.  Avoid places that make you want (trigger) to smoke, such as: ? Bars. ? Parties. ? Smoke-break areas at work.  Spend time with people who do not smoke.  Lower the stress in your life. Stress can make you want to smoke. Try these things to help your stress: ? Getting regular exercise. ? Deep-breathing exercises. ? Yoga. ? Meditating. ? Doing a body scan. To do this, close your eyes, focus on one area of your body at a time from head to toe, and notice which parts  of your body are tense. Try to relax the muscles in those areas.  Download or buy apps on your mobile phone or tablet that can help you stick to your quit plan. There are many free apps, such as QuitGuide from the [employer] Office manager for Disease Control and Prevention). You can find more support from smokefree.gov and other websites.  This information is not intended to replace advice given to you by your health care provider. Make sure you discuss any questions you have with your health care provider. Document Released: 05/04/2009 Document Revised: 03/05/2016 Document Reviewed: 11/22/2014 Elsevier Interactive Patient  Education  2018 [employer].

## 2018-06-23 NOTE — Assessment & Plan Note (Addendum)
Generalized joint pain and stiffness, worsening over time and debilitating, and deformity of digits, requests rheumatology evaluate rated  at a 10

## 2018-06-23 NOTE — Assessment & Plan Note (Signed)
Asked:confirms currently smokes 5 cigarettes/ day Assess: Unwilling to quit but cutting back Advise: needs to QUIT to reduce risk of cancer, cardio and cerebrovascular disease Assist: counseled for 5 minutes and literature provided Arrange: follow up in 3 months

## 2018-06-23 NOTE — Assessment & Plan Note (Signed)
Controlled, no change in medication DASH diet and commitment to daily physical activity for a minimum of 30 minutes discussed and encouraged, as a part of hypertension management. The importance of attaining a healthy weight is also discussed.  BP/Weight 06/23/2018 05/18/2018 02/19/2018 12/22/2017 06/23/2017 12/17/2016 07/27/8164  Systolic BP 196 940 982 867 519 824 299  Diastolic BP 82 68 64 78 78 80 84  Wt. (Lbs) 145 145.12 144 143 143 144 145.8  BMI 29.29 29.31 29.08 28.88 28.88 29.08 29.45

## 2018-07-17 ENCOUNTER — Telehealth: Payer: Self-pay | Admitting: Family Medicine

## 2018-07-17 ENCOUNTER — Other Ambulatory Visit: Payer: Self-pay

## 2018-07-17 MED ORDER — SERTRALINE HCL 50 MG PO TABS: 50.0000 mg | ORAL_TABLET | Freq: Every day | ORAL | 0 refills | Status: DC

## 2018-07-17 NOTE — Telephone Encounter (Signed)
zoloft refilled.

## 2018-07-17 NOTE — Telephone Encounter (Signed)
Please send generic Zoloft to [facility]

## 2018-08-04 ENCOUNTER — Other Ambulatory Visit: Payer: Self-pay | Admitting: Family Medicine

## 2018-09-01 LAB — HM DIABETES EYE EXAM

## 2018-09-15 ENCOUNTER — Telehealth: Payer: Self-pay | Admitting: *Deleted

## 2018-09-15 ENCOUNTER — Other Ambulatory Visit: Payer: Self-pay

## 2018-09-15 MED ORDER — METFORMIN HCL 500 MG PO TABS: 500.0000 mg | ORAL_TABLET | Freq: Every day | ORAL | 2 refills | Status: DC

## 2018-09-15 NOTE — Telephone Encounter (Signed)
Metformin refilled and sent to requested pharmacy

## 2018-09-15 NOTE — Telephone Encounter (Signed)
[facility]

## 2018-10-08 ENCOUNTER — Other Ambulatory Visit: Payer: Self-pay

## 2018-10-08 ENCOUNTER — Telehealth: Payer: Self-pay | Admitting: Family Medicine

## 2018-10-08 MED ORDER — BENAZEPRIL HCL 5 MG PO TABS: 5.0000 mg | ORAL_TABLET | Freq: Every day | ORAL | 1 refills | Status: DC

## 2018-10-08 NOTE — Telephone Encounter (Signed)
Pt is calling in wants a refill of Benazepril, [facility]

## 2018-10-17 ENCOUNTER — Other Ambulatory Visit: Payer: Self-pay | Admitting: Family Medicine

## 2018-10-23 ENCOUNTER — Telehealth: Payer: Self-pay | Admitting: Family Medicine

## 2018-10-23 NOTE — Telephone Encounter (Signed)
Tried to call patient back several times last night and this am.  No answer.   I did leave a message on her machine to activate her mychart and send [place] a message anytime and also told her we would be back in the office Monday 8am.

## 2018-11-02 ENCOUNTER — Telehealth: Payer: Self-pay | Admitting: Family Medicine

## 2018-11-02 NOTE — Telephone Encounter (Signed)
[facility] one for cloNIDine (CATAPRES) 0.1 MG tablet and also one for acid reflux

## 2018-11-03 ENCOUNTER — Other Ambulatory Visit: Payer: Self-pay

## 2018-11-03 MED ORDER — OMEPRAZOLE 20 MG PO CPDR: 20.0000 mg | DELAYED_RELEASE_CAPSULE | Freq: Every day | ORAL | 1 refills | Status: DC

## 2018-11-03 MED ORDER — CLONIDINE HCL 0.1 MG PO TABS: 0.1000 mg | ORAL_TABLET | Freq: Every day | ORAL | 1 refills | Status: DC

## 2018-11-03 NOTE — Telephone Encounter (Signed)
Refills sent

## 2018-11-25 ENCOUNTER — Ambulatory Visit (INDEPENDENT_AMBULATORY_CARE_PROVIDER_SITE_OTHER): Admitting: Family Medicine

## 2018-11-25 ENCOUNTER — Encounter: Payer: Self-pay | Admitting: Family Medicine

## 2018-11-25 VITALS — BP 134/82 | Ht 59.0 in | Wt 140.0 lb

## 2018-11-25 DIAGNOSIS — R112 Nausea with vomiting, unspecified: Secondary | ICD-10-CM | POA: Diagnosis not present

## 2018-11-25 MED ORDER — ONDANSETRON HCL 4 MG PO TABS: 4.0000 mg | ORAL_TABLET | Freq: Three times a day (TID) | ORAL | 0 refills | Status: DC | PRN

## 2018-11-25 NOTE — Progress Notes (Signed)
Virtual Visit via Telephone Note   This visit type was conducted due to national recommendations for restrictions regarding the COVID-19 Pandemic (e.g. social distancing) in an effort to limit this patient's exposure and mitigate transmission in our community.  Due to her co-morbid illnesses, this patient is at least at moderate risk for complications without adequate follow up.  This format is felt to be most appropriate for this patient at this time.  The patient did not have access to video technology/had technical difficulties with video requiring transitioning to audio format only (telephone).  All issues noted in this document were discussed and addressed.  No physical exam could be performed with this format.    Evaluation Performed:  Follow-up visit  Date:  11/25/2018   ID:  Jaime Murray, DOB 09-Jul-1956, MRN [id number]  Patient Location: Home Provider Location: Other:  telemedicine  Location of Patient: Home Location of Provider: Telehealth Consent was obtain for visit to be over via telehealth. I verified that I am speaking with the correct person using two identifiers.  PCP:  Fayrene Helper, MD   Chief Complaint:  n/v  History of Present Illness:    Jaime Murray is a 63 y.o. female patient of Dr. Griffin Dakin.  Who has pertinent history of high blood pressure, allergies, GERD, diabetes among others.  Presents today for telemedicine telephone visit secondary to having onset of nausea and vomiting since yesterday morning.  She reports that since then she has not been able to get anything down to keep it down.  Whether is eating or drinking.  Eating definitely makes it worse.  She would rate the severity of it a 10 out of 10.  As she cannot eat or drink.  She reports that she had some spaghetti for lunch from a patient of hers the day before along with some steak that her and her husband made at home for dinner on 11/23/2018.  She denies fevers, chills, diarrhea,  constipation.  Denies having a cough, shortness of breath that is new, chest pain, chest tightness.  Is unsure if it is something that she ate.  She just woke up with it on Tuesday morning.  Reports that she has had this happen in the past.  And went to the emergency room and they gave her medication that she cannot remember what it was.  The hospital she went to was at the time [place].  She reports overall otherwise feeling okay but is drained and not able to work at this time.  He has been 24 hours without being able to keep fluids and food down.  The patient does not have symptoms concerning for COVID-19 infection (fever, chills, cough, or new shortness of breath).   Past Medical, Surgical, Social History, Allergies, and Medications have been Reviewed.   Past Medical History:  Diagnosis Date  . Anxiety   . Constipation   . Depression   . DM (diabetes mellitus) ([place])   . Fatigue   . GERD (gastroesophageal reflux disease)   . HTN (hypertension)   . Hypercholesteremia   . IDA (iron deficiency anemia)    Past Surgical History:  Procedure Laterality Date  . COLONOSCOPY  01/01/10   Dr. Hansel Feinstein and sigmoid polyps, diminutive rectosigmoid polyp, tubular adenomas  . COLONOSCOPY N/A 12/06/2015   Procedure: COLONOSCOPY;  Surgeon: Daneil Dolin, MD;  Location: [facility];  Service: Endoscopy;  Laterality: N/A;  730  . ESOPHAGOGASTRODUODENOSCOPY  12/14/09   Dr. Marlana Latus  schatzki's ring s/p dilation/moderate size hiatal hernia  . GANGLION CYST EXCISION  05/22/2012   Procedure: REMOVAL GANGLION OF WRIST;  Surgeon: Carole Civil, MD;  Location: [facility];  Service: Orthopedics;  Laterality: Left;     Current Meds  Medication Sig  . acetaminophen (TYLENOL) 325 MG tablet Take 650 mg by mouth every 6 (six) hours as needed. For pain  . aspirin 81 MG tablet Take 81 mg by mouth daily.    . benazepril (LOTENSIN) 5 MG tablet Take 1 tablet (5 mg total) by mouth daily.  . cetirizine (ZYRTEC)  10 MG tablet TAKE 1 TABLET DAILY  . cloNIDine (CATAPRES) 0.1 MG tablet Take 1 tablet (0.1 mg total) by mouth at bedtime.  . folic acid (FOLVITE) 1 MG tablet Take 3 mg by mouth daily.  . hydrochlorothiazide (HYDRODIURIL) 25 MG tablet TAKE 1 TABLET DAILY  . lovastatin (MEVACOR) 40 MG tablet TAKE 1 TABLET AT BEDTIME  . meloxicam (MOBIC) 15 MG tablet One tablet three times weekly for uncontrolled back pain  . metFORMIN (GLUCOPHAGE) 500 MG tablet Take 1 tablet (500 mg total) by mouth daily with breakfast.  . Methotrexate, PF, 20 MG/0.4ML SOAJ Inject 20 mg into the skin once a week. Daughter gives injection once weekly  . omeprazole (PRILOSEC) 20 MG capsule Take 1 capsule (20 mg total) by mouth daily.  . potassium chloride SA (KLOR-CON M20) 20 MEQ tablet Take 1 tablet (20 mEq total) by mouth 2 (two) times daily. (Patient taking differently: Take 20 mEq by mouth daily. )  . Probiotic Product (PROBIOTIC DAILY) CAPS Take 1 capsule by mouth daily.  . ranitidine (ZANTAC) 300 MG tablet Take 1 tablet (300 mg total) by mouth at bedtime.  . sertraline (ZOLOFT) 50 MG tablet TAKE 1 TABLET DAILY ( DOSE INCREASE )   Current Facility-Administered Medications for the 11/25/18 encounter (Office Visit) with Perlie Mayo, NP  Medication  . Influenza (>/= 3 years) inactive virus vaccine (FLVIRIN/FLUZONE) injection SUSP 0.5 mL     Allergies:   Gabapentin   Social History   Tobacco Use  . Smoking status: Current Every Day Smoker    Packs/day: 0.25    Years: 30.00    Pack years: 7.50    Types: Cigarettes  . Smokeless tobacco: Never Used  Substance Use Topics  . Alcohol use: Yes    Alcohol/week: 8.0 standard drinks    Types: 8 Glasses of wine per week    Comment: wine cooler on the weekend   . Drug use: No     Family Hx: The patient's family history includes Arthritis in her unknown relative; Colon cancer in her mother; Heart disease in her unknown relative; Hypertension in her father and mother; Stroke  in her father.  ROS:   Please see the history of present illness.    Review of Systems  Constitutional: Negative.   HENT: Negative.   Eyes: Negative.   Respiratory: Negative.   Cardiovascular: Negative.   Gastrointestinal: Positive for nausea and vomiting. Negative for abdominal distention, abdominal pain, blood in stool, constipation and diarrhea.  Endocrine: Negative.   Genitourinary: Negative.   Musculoskeletal: Negative.   Skin: Negative.   Allergic/Immunologic: Negative.   Neurological: Negative.   Hematological: Negative.   Psychiatric/Behavioral: Negative.    All other systems reviewed and are negative.   Labs/Other Tests and Data Reviewed:    Recent Labs: 06/17/2018: ALT 18; BUN 5; Creat 0.59; Hemoglobin 13.8; Platelets 273; Potassium 3.7; Sodium 141; TSH 1.88  Recent Lipid Panel Lab Results  Component Value Date/Time   CHOL 205 (H) 06/17/2018 10:18 AM   TRIG 92 06/17/2018 10:18 AM   HDL 74 06/17/2018 10:18 AM   CHOLHDL 2.8 06/17/2018 10:18 AM   LDLCALC 111 (H) 06/17/2018 10:18 AM    Wt Readings from Last 3 Encounters:  11/25/18 140 lb (63.5 kg)  06/23/18 145 lb (65.8 kg)  05/18/18 145 lb 1.9 oz (65.8 kg)     Objective:    Vital Signs:  BP 134/82   Ht 4\' 11"  (1.499 m)   Wt 140 lb (63.5 kg)   BMI 28.28 kg/m    GEN:  Alert and oriented RESPIRATORY:  No noted shortness of breath during conversation PSYCH:  Normal affect, mood, memory intact good communication during conversation  ASSESSMENT & PLAN:   1. Nausea and vomiting, intractability of vomiting not specified, unspecified vomiting type Uncontrolled, sent in Zofran to help with her nausea and vomiting.  Hope that this can help her keep some fluids down.  Advised to follow-up in 24 hours if symptoms do not improve.  Possible need to go to the hospital to get fluids if she is unable to get this retracted with Zofran use.  It is only been 24 hours advised for her to continue to drink fluids as she can.   Advised to maintain liquid only until she gets medication in her system.  And then to maintain a bland diet until her stomach is feeling better.  Reviewed side effects, risks and benefits of medication.   Patient acknowledged agreement and understanding of the plan.    - ondansetron (ZOFRAN) 4 MG tablet; Take 1 tablet (4 mg total) by mouth every 8 (eight) hours as needed for nausea or vomiting.  Dispense: 20 tablet; Refill: 0   Time:   Today, I have spent 10 minutes with the patient with telehealth technology discussing the above problems.     Medication Adjustments/Labs and Tests Ordered: Current medicines are reviewed at length with the patient today.  Concerns regarding medicines are outlined above.   Tests Ordered: No orders of the defined types were placed in this encounter.   Medication Changes: Meds ordered this encounter  Medications  . ondansetron (ZOFRAN) 4 MG tablet    Sig: Take 1 tablet (4 mg total) by mouth every 8 (eight) hours as needed for nausea or vomiting.    Dispense:  20 tablet    Refill:  0    Order Specific Question:   Supervising Provider    Answer:   Fayrene Helper [[id number]]    Disposition:  Follow up prn  Signed, Perlie Mayo, NP  11/25/2018 12:23 PM     [facility]

## 2018-11-25 NOTE — Patient Instructions (Signed)
    Thank you for thank you for completing your visit today via telemedicine, telephone.  I appreciate the opportunity to provide you with the care for your health and wellness. Today we discussed: Nausea and vomiting  I have sent in a medication called Zofran, this will help with your nausea and vomiting.  If it does not please let us know within the next 24 hours.  Please drink fluids as you can when you are taking the medication.  This should help you bypass needing to go to the hospital for any fluids this is our goal.  Maintain hydration is critical with nausea and vomiting.  If your symptoms do not subside or you develop a fever, cough new onset shortness of breath outside of any baseline, chest pain, chest tightness, excessive nausea, vomiting, diarrhea or constipation please let us know.  We hope that you feel better.  Please continue to practice social distancing during this time to help keep you in a community safe.  [place] YOUR HANDS WELL AND FREQUENTLY. AVOID TOUCHING YOUR FACE, UNLESS YOUR HANDS ARE FRESHLY WASHED.  GET FRESH AIR DAILY. STAY HYDRATED WITH WATER.   It was a pleasure to see you and I look forward to continuing to work together on your health and well-being. Please do not hesitate to call the office if you need care or have questions about your care.  Have a wonderful day and week. With Gratitude, Cherly Beach, DNP, AGNP-BC

## 2018-11-26 ENCOUNTER — Telehealth: Payer: Self-pay | Admitting: Family Medicine

## 2018-11-26 NOTE — Telephone Encounter (Signed)
Pt called to advise that she is doing so much better, and is able to keep stuff down.  Thank You ---She also advised that you would write a Dr note for for 2 days. She advised that she would be by to get it tomorrow, if we are gone at 12 to tape it to the Door.. I advised that we had to get approval for the note first.

## 2018-11-26 NOTE — Telephone Encounter (Signed)
Note typed for patient. Told her we would fax it if she would provide fax number because we can't tape it to the door for her to pick up because she doesn't get off work until 2pm.

## 2018-12-15 ENCOUNTER — Telehealth: Payer: Self-pay | Admitting: *Deleted

## 2018-12-15 ENCOUNTER — Other Ambulatory Visit: Payer: Self-pay

## 2018-12-15 MED ORDER — METFORMIN HCL 500 MG PO TABS: 500.0000 mg | ORAL_TABLET | Freq: Every day | ORAL | 2 refills | Status: DC

## 2018-12-15 NOTE — Telephone Encounter (Signed)
Refill sent in

## 2018-12-15 NOTE — Telephone Encounter (Signed)
Pt called needing a refill [facility]

## 2018-12-16 ENCOUNTER — Encounter: Payer: Self-pay | Admitting: *Deleted

## 2018-12-23 ENCOUNTER — Telehealth: Payer: Self-pay | Admitting: Family Medicine

## 2018-12-23 NOTE — Telephone Encounter (Signed)
Please send Benazepril & hydrochlorothiazide faxed to [facility]

## 2018-12-24 ENCOUNTER — Other Ambulatory Visit: Payer: Self-pay

## 2018-12-24 MED ORDER — BENAZEPRIL HCL 5 MG PO TABS: 5.0000 mg | ORAL_TABLET | Freq: Every day | ORAL | 1 refills | Status: DC

## 2018-12-24 MED ORDER — HYDROCHLOROTHIAZIDE 25 MG PO TABS: 25.0000 mg | ORAL_TABLET | Freq: Every day | ORAL | 1 refills | Status: DC

## 2018-12-24 NOTE — Telephone Encounter (Signed)
Medications refilled

## 2018-12-24 NOTE — Telephone Encounter (Signed)
pls see refill request

## 2018-12-28 ENCOUNTER — Encounter: Admitting: Family Medicine

## 2019-01-05 ENCOUNTER — Other Ambulatory Visit: Payer: Self-pay | Admitting: Family Medicine

## 2019-01-19 ENCOUNTER — Other Ambulatory Visit: Payer: Self-pay

## 2019-01-19 ENCOUNTER — Telehealth: Payer: Self-pay | Admitting: *Deleted

## 2019-01-19 MED ORDER — OMEPRAZOLE 20 MG PO CPDR: 20.0000 mg | DELAYED_RELEASE_CAPSULE | Freq: Every day | ORAL | 1 refills | Status: DC

## 2019-01-19 MED ORDER — CLONIDINE HCL 0.1 MG PO TABS: 0.1000 mg | ORAL_TABLET | Freq: Every day | ORAL | 3 refills | Status: DC

## 2019-01-19 MED ORDER — LOVASTATIN 40 MG PO TABS: 40.0000 mg | ORAL_TABLET | Freq: Every day | ORAL | 3 refills | Status: DC

## 2019-01-19 MED ORDER — SERTRALINE HCL 50 MG PO TABS: ORAL_TABLET | ORAL | 3 refills | Status: DC

## 2019-01-19 MED ORDER — HYDROCHLOROTHIAZIDE 25 MG PO TABS: 25.0000 mg | ORAL_TABLET | Freq: Every day | ORAL | 3 refills | Status: DC

## 2019-01-19 MED ORDER — METFORMIN HCL 500 MG PO TABS: 500.0000 mg | ORAL_TABLET | Freq: Every day | ORAL | 2 refills | Status: DC

## 2019-01-19 NOTE — Telephone Encounter (Signed)
Pt called needing a refill on all of her medications except for zyrtec [facility] as she got notification they were all going to run out

## 2019-01-19 NOTE — Telephone Encounter (Signed)
Refills sent

## 2019-02-03 ENCOUNTER — Encounter: Admitting: Family Medicine

## 2019-02-11 ENCOUNTER — Encounter: Admitting: Family Medicine

## 2019-04-05 ENCOUNTER — Other Ambulatory Visit: Payer: Self-pay | Admitting: Family Medicine

## 2019-04-07 ENCOUNTER — Telehealth: Payer: Self-pay | Admitting: *Deleted

## 2019-04-07 ENCOUNTER — Other Ambulatory Visit: Payer: Self-pay

## 2019-04-07 MED ORDER — HYDROCHLOROTHIAZIDE 25 MG PO TABS: 25.0000 mg | ORAL_TABLET | Freq: Every day | ORAL | 1 refills | Status: DC

## 2019-04-07 NOTE — Telephone Encounter (Signed)
Pt called needing a refill on her hydrocholorithiazide [facility]

## 2019-04-07 NOTE — Telephone Encounter (Signed)
Medication refilled and sent to requested pharmacy

## 2019-04-08 ENCOUNTER — Ambulatory Visit (INDEPENDENT_AMBULATORY_CARE_PROVIDER_SITE_OTHER): Admitting: Family Medicine

## 2019-04-08 ENCOUNTER — Other Ambulatory Visit: Payer: Self-pay | Admitting: Family Medicine

## 2019-04-08 ENCOUNTER — Other Ambulatory Visit (HOSPITAL_COMMUNITY)
Admission: RE | Admit: 2019-04-08 | Discharge: 2019-04-08 | Disposition: A | Discharge status: Home / Self Care | Source: Ambulatory Visit | Attending: Family Medicine | Admitting: Family Medicine

## 2019-04-08 ENCOUNTER — Other Ambulatory Visit: Payer: Self-pay

## 2019-04-08 ENCOUNTER — Encounter: Payer: Self-pay | Admitting: Family Medicine

## 2019-04-08 VITALS — BP 130/82 | HR 83 | Temp 98.2°F | Resp 15 | Ht 59.0 in | Wt 142.0 lb

## 2019-04-08 DIAGNOSIS — E1159 Type 2 diabetes mellitus with other circulatory complications: Secondary | ICD-10-CM | POA: Diagnosis not present

## 2019-04-08 DIAGNOSIS — Z124 Encounter for screening for malignant neoplasm of cervix: Secondary | ICD-10-CM | POA: Insufficient documentation

## 2019-04-08 DIAGNOSIS — Z23 Encounter for immunization: Secondary | ICD-10-CM | POA: Diagnosis not present

## 2019-04-08 DIAGNOSIS — Z1231 Encounter for screening mammogram for malignant neoplasm of breast: Secondary | ICD-10-CM | POA: Diagnosis not present

## 2019-04-08 DIAGNOSIS — E559 Vitamin D deficiency, unspecified: Secondary | ICD-10-CM

## 2019-04-08 DIAGNOSIS — Z0000 Encounter for general adult medical examination without abnormal findings: Secondary | ICD-10-CM | POA: Diagnosis not present

## 2019-04-08 DIAGNOSIS — I10 Essential (primary) hypertension: Secondary | ICD-10-CM

## 2019-04-08 DIAGNOSIS — F17200 Nicotine dependence, unspecified, uncomplicated: Secondary | ICD-10-CM

## 2019-04-08 DIAGNOSIS — E785 Hyperlipidemia, unspecified: Secondary | ICD-10-CM

## 2019-04-08 MED ORDER — BENAZEPRIL HCL 5 MG PO TABS: 5.0000 mg | ORAL_TABLET | Freq: Every day | ORAL | 1 refills | Status: DC

## 2019-04-08 NOTE — Patient Instructions (Signed)
F/U in office with MD in 6 months, MMSE at visit, and TdAP, call if you need me sooner  Flu vaccine today  Please schedule mammogram at checkout  CBC, fasting lipid, cmp and eGFR, hBA1C, TSH and vit D in next 1 week  It is important that you exercise regularly at least 30 minutes 5 times a week. If you develop chest pain, have severe difficulty breathing, or feel very tired, stop exercising immediately and seek medical attention    Pls plan to stop smoking for total health  Thanks for choosing [facility], we consider it a privelige to serve you.

## 2019-04-11 ENCOUNTER — Encounter: Payer: Self-pay | Admitting: Family Medicine

## 2019-04-11 DIAGNOSIS — Z23 Encounter for immunization: Secondary | ICD-10-CM | POA: Insufficient documentation

## 2019-04-11 NOTE — Progress Notes (Signed)
    Jaime Murray     MRN: [id number]      DOB: Jan 29, 1956  HPI: Patient is in for annual physical exam. Counselling fo smoking cessation done, no commitment at this time. Recent labs,  are reviewed. Immunization is reviewed , and  updated   PE: BP 130/82   Pulse 83   Temp 98.2 F (36.8 C) (Temporal)   Resp 15   Ht 4\' 11"  (1.499 m)   Wt 142 lb (64.4 kg)   SpO2 95%   BMI 28.68 kg/m  Pleasant  female, alert and oriented x 3, in no cardio-pulmonary distress. Afebrile. HEENT No facial trauma or asymetry. Sinuses non tender.  Extra occullar muscles intact, pupils equally reactive to light. External ears normal, . Neck: supple, no adenopathy,JVD or thyromegaly.No bruits.  Chest: Clear to ascultation bilaterally.No crackles or wheezes. Non tender to palpation  Breast: No asymetry,no masses or lumps. No tenderness. No nipple discharge or inversion. No axillary or supraclavicular adenopathy  Cardiovascular system; Heart sounds normal,  S1 and  S2 ,no S3.  No murmur, or thrill. Apical beat not displaced Peripheral pulses normal.  Abdomen: Soft, non tender, no organomegaly or masses. No bruits. Bowel sounds normal. No guarding, tenderness or rebound.    GU: External genitalia normal female genitalia , normal female distribution of hair. No lesions. Urethral meatus normal in size, no  Prolapse, no lesions visibly  Present. Bladder non tender. Vagina pink and moist , with no visible lesions , discharge present . Adequate pelvic support no  cystocele or rectocele noted Cervix pink and appears healthy, no lesions or ulcerations noted, no discharge noted from os Uterus normal size, no adnexal masses, no cervical motion or adnexal tenderness.   Musculoskeletal exam: Full ROM of spine, hips , shoulders and knees. No deformity ,swelling or crepitus noted. No muscle wasting or atrophy.   Neurologic: Cranial nerves 2 to 12 intact. Power, tone ,sensation and reflexes  normal throughout. No disturbance in gait. No tremor.  Skin: Intact, no ulceration, erythema , scaling or rash noted. Pigmentation normal throughout  Psych; Normal mood and affect. Judgement and concentration normal   Assessment & Plan:  Annual physical exam Annual exam as documented. Counseling done  re healthy lifestyle involving commitment to 150 minutes exercise per week, heart healthy diet, and attaining healthy weight.The importance of adequate sleep also discussed. Regular seat belt use and home safety, is also discussed. Changes in health habits are decided on by the patient with goals and time frames  set for achieving them. Immunization and cancer screening needs are specifically addressed at this visit.   NICOTINE ADDICTION Asked:confirms currently smokes cigarettes Assess: Unwilling to quit but cutting back Advise: needs to QUIT to reduce risk of cancer, cardio and cerebrovascular disease Assist: counseled for 5 minutes and literature provided Arrange: follow up in 3 months   Need for immunization against influenza After obtaining informed consent, the vaccine is  administered , with no adverse effect noted at the time of administration.

## 2019-04-11 NOTE — Assessment & Plan Note (Signed)
After obtaining informed consent, the vaccine is  administered , with no adverse effect noted at the time of administration.  

## 2019-04-11 NOTE — Assessment & Plan Note (Signed)
Asked:confirms currently smokes cigarettes Assess: Unwilling to quit but cutting back Advise: needs to QUIT to reduce risk of cancer, cardio and cerebrovascular disease Assist: counseled for 5 minutes and literature provided Arrange: follow up in 3 months  

## 2019-04-11 NOTE — Assessment & Plan Note (Signed)

## 2019-04-14 LAB — CERVICOVAGINAL ANCILLARY ONLY: HPV: NOT DETECTED

## 2019-04-19 LAB — CYTOLOGY - PAP
Diagnosis: NEGATIVE
High risk HPV: NEGATIVE
Molecular Disclaimer: 56
Molecular Disclaimer: NORMAL

## 2019-04-21 ENCOUNTER — Inpatient Hospital Stay (HOSPITAL_COMMUNITY): Admission: RE | Admit: 2019-04-21 | Source: Ambulatory Visit

## 2019-04-22 ENCOUNTER — Ambulatory Visit (HOSPITAL_COMMUNITY)
Admission: RE | Admit: 2019-04-22 | Discharge: 2019-04-22 | Disposition: A | Discharge status: Home / Self Care | Source: Ambulatory Visit | Attending: Family Medicine | Admitting: Family Medicine

## 2019-04-22 ENCOUNTER — Other Ambulatory Visit: Payer: Self-pay

## 2019-04-22 DIAGNOSIS — Z1231 Encounter for screening mammogram for malignant neoplasm of breast: Secondary | ICD-10-CM | POA: Insufficient documentation

## 2019-04-26 ENCOUNTER — Telehealth: Payer: Self-pay | Admitting: *Deleted

## 2019-04-26 ENCOUNTER — Other Ambulatory Visit (HOSPITAL_COMMUNITY): Payer: Self-pay | Admitting: Family Medicine

## 2019-04-26 DIAGNOSIS — R928 Other abnormal and inconclusive findings on diagnostic imaging of breast: Secondary | ICD-10-CM

## 2019-04-26 MED ORDER — SERTRALINE HCL 50 MG PO TABS: ORAL_TABLET | ORAL | 3 refills | Status: DC

## 2019-04-26 NOTE — Telephone Encounter (Signed)
Pt needs the generic for [facility]ft [facility]

## 2019-04-26 NOTE — Telephone Encounter (Signed)
Med sent in.

## 2019-05-05 ENCOUNTER — Ambulatory Visit (HOSPITAL_COMMUNITY): Admission: RE | Admit: 2019-05-05 | Source: Ambulatory Visit

## 2019-05-05 ENCOUNTER — Ambulatory Visit (HOSPITAL_COMMUNITY)
Admission: RE | Admit: 2019-05-05 | Discharge: 2019-05-05 | Disposition: A | Discharge status: Home / Self Care | Source: Ambulatory Visit | Attending: Family Medicine | Admitting: Family Medicine

## 2019-05-05 ENCOUNTER — Other Ambulatory Visit: Payer: Self-pay

## 2019-05-05 DIAGNOSIS — R928 Other abnormal and inconclusive findings on diagnostic imaging of breast: Secondary | ICD-10-CM | POA: Insufficient documentation

## 2019-05-06 ENCOUNTER — Encounter (HOSPITAL_COMMUNITY)

## 2019-07-06 ENCOUNTER — Other Ambulatory Visit: Payer: Self-pay

## 2019-07-06 ENCOUNTER — Telehealth: Payer: Self-pay | Admitting: *Deleted

## 2019-07-06 MED ORDER — BENAZEPRIL HCL 5 MG PO TABS: 5.0000 mg | ORAL_TABLET | Freq: Every day | ORAL | 1 refills | Status: DC

## 2019-07-06 NOTE — Telephone Encounter (Signed)
Pt needs her [facility]epril [facility] she is almost out of th emedication

## 2019-07-06 NOTE — Telephone Encounter (Signed)
Med refilled.

## 2019-10-01 LAB — COMPLETE METABOLIC PANEL WITH GFR
AG Ratio: 1.4 (calc) (ref 1.0–2.5)
ALT: 21 U/L (ref 6–29)
AST: 22 U/L (ref 10–35)
Albumin: 4 g/dL (ref 3.6–5.1)
Alkaline phosphatase (APISO): 72 U/L (ref 37–153)
BUN: 9 mg/dL (ref 7–25)
CO2: 25 mmol/L (ref 20–32)
Calcium: 9.4 mg/dL (ref 8.6–10.4)
Chloride: 104 mmol/L (ref 98–110)
Creat: 0.55 mg/dL (ref 0.50–0.99)
GFR, Est African American: 117 mL/min/{1.73_m2} (ref >=60)
GFR, Est Non African American: 101 mL/min/{1.73_m2} (ref >=60)
Globulin: 2.8 g/dL (calc) (ref 1.9–3.7)
Glucose, Bld: 117 mg/dL — ABNORMAL HIGH (ref 65–99)
Potassium: 3.6 mmol/L (ref 3.5–5.3)
Sodium: 141 mmol/L (ref 135–146)
Total Bilirubin: 0.4 mg/dL (ref 0.2–1.2)
Total Protein: 6.8 g/dL (ref 6.1–8.1)

## 2019-10-01 LAB — HEMOGLOBIN A1C
Hgb A1c MFr Bld: 6 % of total Hgb — ABNORMAL HIGH (ref <5.7)
Mean Plasma Glucose: 126 (calc)
eAG (mmol/L): 7 (calc)

## 2019-10-01 LAB — CBC
HCT: 38.2 % (ref 35.0–45.0)
Hemoglobin: 13.1 g/dL (ref 11.7–15.5)
MCH: 29.4 pg (ref 27.0–33.0)
MCHC: 34.3 g/dL (ref 32.0–36.0)
MCV: 85.8 fL (ref 80.0–100.0)
MPV: 12.2 fL (ref 7.5–12.5)
Platelets: 263 10*3/uL (ref 140–400)
RBC: 4.45 10*6/uL (ref 3.80–5.10)
RDW: 13.4 % (ref 11.0–15.0)
WBC: 7.2 10*3/uL (ref 3.8–10.8)

## 2019-10-01 LAB — LIPID PANEL
Cholesterol: 191 mg/dL (ref <200)
HDL: 66 mg/dL (ref >=50)
LDL Cholesterol (Calc): 105 mg/dL (calc) — ABNORMAL HIGH
Non-HDL Cholesterol (Calc): 125 mg/dL (calc) (ref <130)
Total CHOL/HDL Ratio: 2.9 (calc) (ref <5.0)
Triglycerides: 108 mg/dL (ref <150)

## 2019-10-01 LAB — TSH: TSH: 2.17 mIU/L (ref 0.40–4.50)

## 2019-10-06 ENCOUNTER — Ambulatory Visit: Admitting: Family Medicine

## 2019-10-07 ENCOUNTER — Ambulatory Visit: Admitting: Family Medicine

## 2019-10-12 ENCOUNTER — Other Ambulatory Visit: Payer: Self-pay | Admitting: Family Medicine

## 2019-12-20 ENCOUNTER — Other Ambulatory Visit: Payer: Self-pay | Admitting: Family Medicine

## 2019-12-21 ENCOUNTER — Other Ambulatory Visit: Payer: Self-pay | Admitting: Family Medicine

## 2020-01-11 ENCOUNTER — Encounter: Payer: Self-pay | Admitting: Family Medicine

## 2020-01-11 ENCOUNTER — Ambulatory Visit (INDEPENDENT_AMBULATORY_CARE_PROVIDER_SITE_OTHER): Admitting: Family Medicine

## 2020-01-11 ENCOUNTER — Other Ambulatory Visit: Payer: Self-pay

## 2020-01-11 VITALS — BP 125/86 | HR 93 | Temp 98.6°F | Resp 16 | Ht 59.0 in | Wt 133.0 lb

## 2020-01-11 DIAGNOSIS — R109 Unspecified abdominal pain: Secondary | ICD-10-CM | POA: Diagnosis not present

## 2020-01-11 DIAGNOSIS — R3989 Other symptoms and signs involving the genitourinary system: Secondary | ICD-10-CM

## 2020-01-11 DIAGNOSIS — R918 Other nonspecific abnormal finding of lung field: Secondary | ICD-10-CM | POA: Diagnosis not present

## 2020-01-11 DIAGNOSIS — F17200 Nicotine dependence, unspecified, uncomplicated: Secondary | ICD-10-CM

## 2020-01-11 DIAGNOSIS — R102 Pelvic and perineal pain unspecified side: Secondary | ICD-10-CM

## 2020-01-11 DIAGNOSIS — R634 Abnormal weight loss: Secondary | ICD-10-CM

## 2020-01-11 DIAGNOSIS — R630 Anorexia: Secondary | ICD-10-CM

## 2020-01-11 LAB — POCT URINALYSIS DIP (CLINITEK)
Blood, UA: NEGATIVE
Glucose, UA: NEGATIVE mg/dL
Ketones, POC UA: NEGATIVE mg/dL
Leukocytes, UA: NEGATIVE
Nitrite, UA: NEGATIVE
POC PROTEIN,UA: NEGATIVE
Spec Grav, UA: 1.015 (ref 1.010–1.025)
Urobilinogen, UA: 1 E.U./dL
pH, UA: 7 (ref 5.0–8.0)

## 2020-01-11 NOTE — Patient Instructions (Addendum)
F/U In office with mD in 5 to 6  weeks, call if you need me sooner  [facility]UA in office today because of pain and dark urine  You are being referred for chest scan to f/u lung nodules, also for abdominal and pelvic scan to evaluate recurrent severe left flank and left Upper quadrant pain with excessive vomiting  You are referred urgently to Dr Sydell Axon re pain and loss of appetite and weight in past approx 8 months  No MORE cigarettes please!  Hope that you will soon feel better, we will work to get you there!  Thanks for choosing [facility], we consider it a privelige to serve you.

## 2020-01-12 ENCOUNTER — Other Ambulatory Visit (HOSPITAL_COMMUNITY)
Admission: RE | Admit: 2020-01-12 | Discharge: 2020-01-12 | Disposition: A | Discharge status: Home / Self Care | Attending: *Deleted | Admitting: *Deleted

## 2020-01-12 DIAGNOSIS — R3989 Other symptoms and signs involving the genitourinary system: Secondary | ICD-10-CM | POA: Insufficient documentation

## 2020-01-12 DIAGNOSIS — R102 Pelvic and perineal pain: Secondary | ICD-10-CM | POA: Insufficient documentation

## 2020-01-13 ENCOUNTER — Emergency Department (HOSPITAL_COMMUNITY)

## 2020-01-13 ENCOUNTER — Inpatient Hospital Stay (HOSPITAL_COMMUNITY)
Admission: EM | Admit: 2020-01-13 | Discharge: 2020-01-17 | DRG: 392 | Disposition: A | Discharge status: Home / Self Care | Source: Ambulatory Visit | Attending: Family Medicine | Admitting: Family Medicine

## 2020-01-13 ENCOUNTER — Other Ambulatory Visit: Payer: Self-pay

## 2020-01-13 ENCOUNTER — Ambulatory Visit (INDEPENDENT_AMBULATORY_CARE_PROVIDER_SITE_OTHER): Admitting: Gastroenterology

## 2020-01-13 ENCOUNTER — Encounter (HOSPITAL_COMMUNITY): Payer: Self-pay

## 2020-01-13 ENCOUNTER — Encounter: Payer: Self-pay | Admitting: Gastroenterology

## 2020-01-13 DIAGNOSIS — F329 Major depressive disorder, single episode, unspecified: Secondary | ICD-10-CM | POA: Diagnosis present

## 2020-01-13 DIAGNOSIS — Z79899 Other long term (current) drug therapy: Secondary | ICD-10-CM | POA: Diagnosis not present

## 2020-01-13 DIAGNOSIS — E7800 Pure hypercholesterolemia, unspecified: Secondary | ICD-10-CM | POA: Diagnosis present

## 2020-01-13 DIAGNOSIS — Z888 Allergy status to other drugs, medicaments and biological substances status: Secondary | ICD-10-CM | POA: Diagnosis not present

## 2020-01-13 DIAGNOSIS — K219 Gastro-esophageal reflux disease without esophagitis: Secondary | ICD-10-CM | POA: Diagnosis present

## 2020-01-13 DIAGNOSIS — F419 Anxiety disorder, unspecified: Secondary | ICD-10-CM | POA: Diagnosis present

## 2020-01-13 DIAGNOSIS — E1165 Type 2 diabetes mellitus with hyperglycemia: Secondary | ICD-10-CM | POA: Diagnosis present

## 2020-01-13 DIAGNOSIS — Z800 Family history of malignant neoplasm of digestive organs: Secondary | ICD-10-CM

## 2020-01-13 DIAGNOSIS — R112 Nausea with vomiting, unspecified: Secondary | ICD-10-CM

## 2020-01-13 DIAGNOSIS — K449 Diaphragmatic hernia without obstruction or gangrene: Secondary | ICD-10-CM | POA: Diagnosis present

## 2020-01-13 DIAGNOSIS — E785 Hyperlipidemia, unspecified: Secondary | ICD-10-CM | POA: Diagnosis present

## 2020-01-13 DIAGNOSIS — I10 Essential (primary) hypertension: Secondary | ICD-10-CM

## 2020-01-13 DIAGNOSIS — E876 Hypokalemia: Secondary | ICD-10-CM | POA: Diagnosis present

## 2020-01-13 DIAGNOSIS — K3189 Other diseases of stomach and duodenum: Secondary | ICD-10-CM | POA: Diagnosis present

## 2020-01-13 DIAGNOSIS — R1012 Left upper quadrant pain: Secondary | ICD-10-CM

## 2020-01-13 DIAGNOSIS — Z7982 Long term (current) use of aspirin: Secondary | ICD-10-CM | POA: Diagnosis not present

## 2020-01-13 DIAGNOSIS — E1159 Type 2 diabetes mellitus with other circulatory complications: Secondary | ICD-10-CM

## 2020-01-13 DIAGNOSIS — Z8249 Family history of ischemic heart disease and other diseases of the circulatory system: Secondary | ICD-10-CM | POA: Diagnosis not present

## 2020-01-13 DIAGNOSIS — Z7984 Long term (current) use of oral hypoglycemic drugs: Secondary | ICD-10-CM

## 2020-01-13 DIAGNOSIS — R1310 Dysphagia, unspecified: Secondary | ICD-10-CM | POA: Diagnosis present

## 2020-01-13 DIAGNOSIS — Z20822 Contact with and (suspected) exposure to covid-19: Secondary | ICD-10-CM | POA: Diagnosis present

## 2020-01-13 DIAGNOSIS — K5900 Constipation, unspecified: Secondary | ICD-10-CM | POA: Diagnosis present

## 2020-01-13 DIAGNOSIS — Z8349 Family history of other endocrine, nutritional and metabolic diseases: Secondary | ICD-10-CM | POA: Diagnosis not present

## 2020-01-13 DIAGNOSIS — M069 Rheumatoid arthritis, unspecified: Secondary | ICD-10-CM

## 2020-01-13 DIAGNOSIS — R531 Weakness: Secondary | ICD-10-CM | POA: Diagnosis present

## 2020-01-13 DIAGNOSIS — Z4659 Encounter for fitting and adjustment of other gastrointestinal appliance and device: Secondary | ICD-10-CM

## 2020-01-13 DIAGNOSIS — F17210 Nicotine dependence, cigarettes, uncomplicated: Secondary | ICD-10-CM | POA: Diagnosis present

## 2020-01-13 DIAGNOSIS — Z86010 Personal history of colonic polyps: Secondary | ICD-10-CM

## 2020-01-13 DIAGNOSIS — R000 Tachycardia, unspecified: Secondary | ICD-10-CM | POA: Diagnosis present

## 2020-01-13 DIAGNOSIS — R7303 Prediabetes: Secondary | ICD-10-CM

## 2020-01-13 LAB — COMPREHENSIVE METABOLIC PANEL
ALT: 22 U/L (ref 0–44)
AST: 20 U/L (ref 15–41)
Albumin: 4.4 g/dL (ref 3.5–5.0)
Alkaline Phosphatase: 76 U/L (ref 38–126)
Anion gap: 19 — ABNORMAL HIGH (ref 5–15)
BUN: 14 mg/dL (ref 8–23)
CO2: 30 mmol/L (ref 22–32)
Calcium: 10.2 mg/dL (ref 8.9–10.3)
Chloride: 91 mmol/L — ABNORMAL LOW (ref 98–111)
Creatinine, Ser: 0.82 mg/dL (ref 0.44–1.00)
GFR calc Af Amer: >60 mL/min (ref >60)
GFR calc non Af Amer: >60 mL/min (ref >60)
Glucose, Bld: 166 mg/dL — ABNORMAL HIGH (ref 70–99)
Potassium: 2.7 mmol/L — CL (ref 3.5–5.1)
Sodium: 140 mmol/L (ref 135–145)
Total Bilirubin: 0.9 mg/dL (ref 0.3–1.2)
Total Protein: 9 g/dL — ABNORMAL HIGH (ref 6.5–8.1)

## 2020-01-13 LAB — URINALYSIS, ROUTINE W REFLEX MICROSCOPIC
Bacteria, UA: NONE SEEN
Bilirubin Urine: NEGATIVE
Glucose, UA: NEGATIVE mg/dL
Hgb urine dipstick: NEGATIVE
Ketones, ur: 20 mg/dL — AB
Leukocytes,Ua: NEGATIVE
Nitrite: NEGATIVE
Protein, ur: 30 mg/dL — AB
Specific Gravity, Urine: 1.036 — ABNORMAL HIGH (ref 1.005–1.030)
pH: 8 (ref 5.0–8.0)

## 2020-01-13 LAB — CBC WITH DIFFERENTIAL/PLATELET
Abs Immature Granulocytes: 0.04 10*3/uL (ref 0.00–0.07)
Basophils Absolute: 0 10*3/uL (ref 0.0–0.1)
Basophils Relative: 0 %
Eosinophils Absolute: 0.1 10*3/uL (ref 0.0–0.5)
Eosinophils Relative: 1 %
HCT: 46.2 % — ABNORMAL HIGH (ref 36.0–46.0)
Hemoglobin: 15.5 g/dL — ABNORMAL HIGH (ref 12.0–15.0)
Immature Granulocytes: 0 %
Lymphocytes Relative: 21 %
Lymphs Abs: 2.2 10*3/uL (ref 0.7–4.0)
MCH: 28.8 pg (ref 26.0–34.0)
MCHC: 33.5 g/dL (ref 30.0–36.0)
MCV: 85.9 fL (ref 80.0–100.0)
Monocytes Absolute: 0.7 10*3/uL (ref 0.1–1.0)
Monocytes Relative: 7 %
Neutro Abs: 7.4 10*3/uL (ref 1.7–7.7)
Neutrophils Relative %: 71 %
Platelets: 397 10*3/uL (ref 150–400)
RBC: 5.38 MIL/uL — ABNORMAL HIGH (ref 3.87–5.11)
RDW: 14.4 % (ref 11.5–15.5)
WBC: 10.5 10*3/uL (ref 4.0–10.5)
nRBC: 0 % (ref 0.0–0.2)

## 2020-01-13 LAB — HEMOGLOBIN A1C
Hgb A1c MFr Bld: 6.2 % — ABNORMAL HIGH (ref 4.8–5.6)
Mean Plasma Glucose: 131.24 mg/dL

## 2020-01-13 LAB — URINE CULTURE: Culture: <10000 — AB

## 2020-01-13 LAB — GLUCOSE, CAPILLARY: Glucose-Capillary: 140 mg/dL — ABNORMAL HIGH (ref 70–99)

## 2020-01-13 LAB — RAPID URINE DRUG SCREEN, HOSP PERFORMED
Amphetamines: NOT DETECTED
Barbiturates: NOT DETECTED
Benzodiazepines: NOT DETECTED
Cocaine: NOT DETECTED
Opiates: NOT DETECTED
Tetrahydrocannabinol: NOT DETECTED

## 2020-01-13 LAB — SARS CORONAVIRUS 2 BY RT PCR (HOSPITAL ORDER, PERFORMED IN ~~LOC~~ HOSPITAL LAB): SARS Coronavirus 2: NEGATIVE

## 2020-01-13 LAB — PROTIME-INR
INR: 1 (ref 0.8–1.2)
Prothrombin Time: 13.2 seconds (ref 11.4–15.2)

## 2020-01-13 LAB — MAGNESIUM: Magnesium: 1.8 mg/dL (ref 1.7–2.4)

## 2020-01-13 LAB — ETHANOL: Alcohol, Ethyl (B): <10 mg/dL (ref <10)

## 2020-01-13 LAB — LIPASE, BLOOD: Lipase: 25 U/L (ref 11–51)

## 2020-01-13 MED ORDER — POTASSIUM CHLORIDE 10 MEQ/100ML IV SOLN
10.0000 meq | INTRAVENOUS | Status: AC
  Administered 2020-01-13 (×2): 10 meq via INTRAVENOUS
  Filled 2020-01-13 (×3): qty 100

## 2020-01-13 MED ORDER — HYDROMORPHONE HCL 1 MG/ML IJ SOLN
0.5000 mg | INTRAMUSCULAR | Status: DC | PRN
  Administered 2020-01-13 – 2020-01-14 (×2): 1 mg via INTRAVENOUS
  Administered 2020-01-15: 0.5 mg via INTRAVENOUS
  Administered 2020-01-17: 1 mg via INTRAVENOUS
  Filled 2020-01-13 (×4): qty 1

## 2020-01-13 MED ORDER — SODIUM CHLORIDE 0.9 % IV BOLUS
1000.0000 mL | Freq: Once | INTRAVENOUS | Status: AC
  Administered 2020-01-13: 1000 mL via INTRAVENOUS

## 2020-01-13 MED ORDER — ONDANSETRON HCL 4 MG/2ML IJ SOLN
4.0000 mg | Freq: Four times a day (QID) | INTRAMUSCULAR | Status: DC | PRN
  Administered 2020-01-13: 4 mg via INTRAVENOUS
  Filled 2020-01-13: qty 2

## 2020-01-13 MED ORDER — IOHEXOL 9 MG/ML PO SOLN
ORAL | Status: AC
  Filled 2020-01-13: qty 1000

## 2020-01-13 MED ORDER — ONDANSETRON HCL 4 MG/2ML IJ SOLN
4.0000 mg | Freq: Once | INTRAMUSCULAR | Status: AC
  Administered 2020-01-13: 4 mg via INTRAVENOUS
  Filled 2020-01-13: qty 2

## 2020-01-13 MED ORDER — POTASSIUM CHLORIDE 10 MEQ/100ML IV SOLN
10.0000 meq | INTRAVENOUS | Status: AC
  Administered 2020-01-13 (×3): 10 meq via INTRAVENOUS
  Filled 2020-01-13 (×2): qty 100

## 2020-01-13 MED ORDER — ONDANSETRON HCL 4 MG PO TABS: 4.0000 mg | ORAL_TABLET | Freq: Four times a day (QID) | ORAL | Status: DC | PRN

## 2020-01-13 MED ORDER — ACETAMINOPHEN 325 MG PO TABS
650.0000 mg | ORAL_TABLET | Freq: Four times a day (QID) | ORAL | Status: DC | PRN
  Administered 2020-01-14 – 2020-01-15 (×3): 650 mg via ORAL
  Filled 2020-01-13 (×3): qty 2

## 2020-01-13 MED ORDER — FENTANYL CITRATE (PF) 100 MCG/2ML IJ SOLN
100.0000 ug | INTRAMUSCULAR | Status: DC | PRN
  Administered 2020-01-13 (×2): 100 ug via INTRAVENOUS
  Filled 2020-01-13 (×2): qty 2

## 2020-01-13 MED ORDER — ACETAMINOPHEN 650 MG RE SUPP: 650.0000 mg | Freq: Four times a day (QID) | RECTAL | Status: DC | PRN

## 2020-01-13 MED ORDER — ONDANSETRON HCL 4 MG/2ML IJ SOLN
4.0000 mg | Freq: Four times a day (QID) | INTRAMUSCULAR | Status: DC | PRN
  Administered 2020-01-14: 4 mg via INTRAVENOUS
  Filled 2020-01-13: qty 2

## 2020-01-13 MED ORDER — IOHEXOL 300 MG/ML  SOLN
100.0000 mL | Freq: Once | INTRAMUSCULAR | Status: AC | PRN
  Administered 2020-01-13: 100 mL via INTRAVENOUS

## 2020-01-13 MED ORDER — POTASSIUM CHLORIDE IN NACL 40-0.9 MEQ/L-% IV SOLN
INTRAVENOUS | Status: DC
  Filled 2020-01-13: qty 1000

## 2020-01-13 MED ORDER — INSULIN ASPART 100 UNIT/ML ~~LOC~~ SOLN
0.0000 [IU] | SUBCUTANEOUS | Status: DC
  Administered 2020-01-13: 1 [IU] via SUBCUTANEOUS
  Administered 2020-01-14 (×2): 2 [IU] via SUBCUTANEOUS
  Administered 2020-01-14: 1 [IU] via SUBCUTANEOUS

## 2020-01-13 MED ORDER — HYDRALAZINE HCL 20 MG/ML IJ SOLN
10.0000 mg | Freq: Four times a day (QID) | INTRAMUSCULAR | Status: DC | PRN
  Administered 2020-01-13: 10 mg via INTRAVENOUS
  Filled 2020-01-13: qty 1

## 2020-01-13 NOTE — H&P (Signed)
History and Physical    Jaime Murray:[id number] DOB: June 11, 1956 DOA: 01/13/2020  PCP: Jaime Helper, MD  Patient coming from: Home  I have personally briefly reviewed patient's old medical records in [place]  Chief Complaint: Abdominal pain, nausea and vomiting  HPI: Jaime Murray is a 64 y.o. female with medical history significant of diabetes, hypertension, rheumatoid arthritis, hyperlipidemia, presents to the hospital with abdominal pain, nausea and vomiting.  She had intermittent episodes of vomiting for the past 2 weeks.  For the past week, she has had 3 different episodes.  She describes significant pain in her left upper quadrant.  She is not had any fever.  Bowel movements have been normal.  She denies any shortness of breath.  She was referred to gastroenterology as an outpatient for further work-up.  When evaluated in the GI office today, she was noted to be increasingly weak and appears to be unwell.  She was tachycardic.  She was sent to the ER for evaluation.  On arrival to the ER, she was noted to be tachycardic and severely hypokalemic with a potassium of 2.7.  CT scan of the abdomen was completed that showed evidence of gastric volvulus with the majority of her stomach being in her chest.  General surgery consulted with recommendations to transfer to [facility] for definitive care.  Review of Systems: As per HPI otherwise 10 point review of systems negative.    Past Medical History:  Diagnosis Date  . Anxiety   . Constipation   . Depression   . DM (diabetes mellitus) ([place])   . Fatigue   . GERD (gastroesophageal reflux disease)   . HTN (hypertension)   . Hypercholesteremia   . IDA (iron deficiency anemia)     Past Surgical History:  Procedure Laterality Date  . COLONOSCOPY  01/01/10   Dr. Hansel Feinstein and sigmoid polyps, diminutive rectosigmoid polyp, tubular adenomas  . COLONOSCOPY N/A 12/06/2015   Procedure: COLONOSCOPY;  Surgeon: Daneil Dolin, MD;  [facility]: [facility];  Service: Endoscopy;  Laterality: N/A;  730  . ESOPHAGOGASTRODUODENOSCOPY  12/14/09   Dr. Rourk:tight schatzki's ring s/p dilation/moderate size hiatal hernia  . GANGLION CYST EXCISION  05/22/2012   Procedure: REMOVAL GANGLION OF WRIST;  Surgeon: Carole Civil, MD;  Location: [facility];  Service: Orthopedics;  Laterality: Left;    Social History:  reports that she has been smoking cigarettes. She has a 7.50 pack-year smoking history. She has never used smokeless tobacco. She reports current alcohol use. She reports that she does not use drugs.  Allergies  Allergen Reactions  . Gabapentin     palpitation    Family History  Problem Relation Age of Onset  . Hypertension Mother   . Colon cancer Mother        unknown age of disease, still living, cancer-free   . Hypertension Father   . Stroke Father   . Heart disease Other   . Arthritis Other      Prior to Admission medications   Medication Sig Start Date End Date Taking? Authorizing Provider  acetaminophen (TYLENOL) 325 MG tablet Take 650 mg by mouth every 6 (six) hours as needed. For pain   Yes [provider]  aspirin 81 MG tablet Take 81 mg by mouth daily.     Yes [provider]  B-D TB SYRINGE 1CC/27GX1/2" 27G X 1/2" 1 ML MISC 1 each by Other route as directed. 10/16/19  Yes [provider]  benazepril (LOTENSIN) 5 MG tablet TAKE 1 TABLET DAILY 12/21/19  Yes Jaime Helper, MD  cetirizine (ZYRTEC) 10 MG tablet TAKE 1 TABLET DAILY 04/05/19  Yes Jaime Helper, MD  cloNIDine (CATAPRES) 0.1 MG tablet TAKE 1 TABLET AT BEDTIME 12/21/19  Yes Jaime Helper, MD  folic acid (FOLVITE) 1 MG tablet Take 3 mg by mouth daily.   Yes [provider]  hydrochlorothiazide (HYDRODIURIL) 25 MG tablet TAKE 1 TABLET DAILY 10/12/19  Yes Perlie Mayo, NP  lovastatin (MEVACOR) 40 MG tablet Take 1 tablet (40 mg total) by mouth at bedtime. 01/19/19  Yes Jaime Helper, MD  Methotrexate Sodium (METHOTREXATE, PF,) 50 MG/2ML injection Inject 0.9 mLs into the muscle once a week. Every Sunday 09/28/19  Yes [provider]  Omega 3 1000 MG CAPS Take 1 capsule by mouth daily.   Yes [provider]  omeprazole (PRILOSEC) 20 MG capsule TAKE 1 CAPSULE DAILY 10/12/19  Yes Perlie Mayo, NP  potassium chloride SA (KLOR-CON M20) 20 MEQ tablet Take 1 tablet (20 mEq total) by mouth 2 (two) times daily. 08/06/16  Yes Jaime Helper, MD  Probiotic Product (PROBIOTIC DAILY) CAPS Take 1 capsule by mouth daily.   Yes [provider]  sertraline (ZOLOFT) 50 MG tablet TAKE 1 TABLET DAILY ( DOSE INCREASE ) 04/26/19  Yes Jaime Helper, MD  metFORMIN (GLUCOPHAGE) 500 MG tablet Take 1 tablet (500 mg total) by mouth daily with breakfast. Patient not taking: Reported on 01/13/2020 01/19/19   Jaime Helper, MD    Physical Exam: Vitals:   01/13/20 0901 01/13/20 1000 01/13/20 1027 01/13/20 1100  BP: (!) 163/114 (!) 139/104 (!) 139/104 (!) 169/110  Pulse: (!) 118 (!) 101 (!) 108 96  Resp: 18  (!) 23 15  Temp: (!) 97.5 F (36.4 C)     TempSrc: Oral     SpO2: 96% 92% 93% 99%  Weight:      Height:        Constitutional: NAD, calm, comfortable Eyes: PERRL, lids and conjunctivae normal ENMT: Mucous membranes are moist. Posterior pharynx clear of any exudate or lesions.Normal dentition.  Neck: normal, supple, no masses, no thyromegaly Respiratory: clear to auscultation bilaterally, no wheezing, no crackles. Normal respiratory effort. No accessory muscle use.  Cardiovascular: Regular rate and rhythm, no murmurs / rubs / gallops. No extremity edema. 2+ pedal pulses. No carotid bruits.  Abdomen: Left upper quadrant tenderness, no masses palpated. No hepatosplenomegaly. Bowel sounds positive.  Musculoskeletal: no clubbing / cyanosis. No joint deformity upper and lower extremities. Good ROM, no contractures. Normal muscle tone.  Skin: no rashes,  lesions, ulcers. No induration Neurologic: CN 2-12 grossly intact. Sensation intact, DTR normal. Strength 5/5 in all 4.  Psychiatric: Normal judgment and insight. Alert and oriented x 3. Normal mood.    Labs on Admission: I have personally reviewed following labs and imaging studies  CBC: Recent Labs  Lab 01/13/20 0926  WBC 10.5  NEUTROABS 7.4  HGB 15.5*  HCT 46.2*  MCV 85.9  PLT 622   Basic Metabolic Panel: Recent Labs  Lab 01/13/20 0926  NA 140  K 2.7*  CL 91*  CO2 30  GLUCOSE 166*  BUN 14  CREATININE 0.82  CALCIUM 10.2   GFR: Estimated Creatinine Clearance: 55.8 mL/min (by C-G formula based on SCr of 0.82 mg/dL). Liver Function Tests: Recent Labs  Lab 01/13/20 0926  AST 20  ALT 22  ALKPHOS 76  BILITOT 0.9  PROT 9.0*  ALBUMIN 4.4   Recent Labs  Lab 01/13/20 0926  LIPASE 25   No results for input(s): AMMONIA in the last 168 hours. Coagulation Profile: Recent Labs  Lab 01/13/20 0926  INR 1.0   Cardiac Enzymes: No results for input(s): CKTOTAL, CKMB, CKMBINDEX, TROPONINI in the last 168 hours. BNP (last 3 results) No results for input(s): PROBNP in the last 8760 hours. HbA1C: No results for input(s): HGBA1C in the last 72 hours. CBG: No results for input(s): GLUCAP in the last 168 hours. Lipid Profile: No results for input(s): CHOL, HDL, LDLCALC, TRIG, CHOLHDL, LDLDIRECT in the last 72 hours. Thyroid Function Tests: No results for input(s): TSH, T4TOTAL, FREET4, T3FREE, THYROIDAB in the last 72 hours. Anemia Panel: No results for input(s): VITAMINB12, FOLATE, FERRITIN, TIBC, IRON, RETICCTPCT in the last 72 hours. Urine analysis:    Component Value Date/Time   COLORURINE YELLOW 01/13/2020 1154   APPEARANCEUR CLEAR 01/13/2020 1154   LABSPEC 1.036 (H) 01/13/2020 1154   PHURINE 8.0 01/13/2020 1154   GLUCOSEU NEGATIVE 01/13/2020 1154   HGBUR NEGATIVE 01/13/2020 1154   HGBUR negative 04/06/2009 0758   BILIRUBINUR NEGATIVE 01/13/2020 1154    BILIRUBINUR small (A) 01/11/2020 1615   BILIRUBINUR small 11/17/2013 0854   KETONESUR 20 (A) 01/13/2020 1154   PROTEINUR 30 (A) 01/13/2020 1154   UROBILINOGEN 1.0 01/11/2020 1615   UROBILINOGEN 0.2 04/06/2009 0758   NITRITE NEGATIVE 01/13/2020 1154   LEUKOCYTESUR NEGATIVE 01/13/2020 1154    Radiological Exams on Admission: CT Abdomen Pelvis W Contrast  Result Date: 01/13/2020 CLINICAL DATA:  Abdominal abscess or infection expected. Abdominal tenderness and nausea and vomiting. Tachycardia. EXAM: CT ABDOMEN AND PELVIS WITH CONTRAST TECHNIQUE: Multidetector CT imaging of the abdomen and pelvis was performed using the standard protocol following bolus administration of intravenous contrast. CONTRAST:  115mL OMNIPAQUE IOHEXOL 300 MG/ML  SOLN COMPARISON:  11/25/2013 FINDINGS: Lower chest: Lung bases are clear. Large gastric hernia hernia in the LEFT hemithorax described in the stomach section. Hepatobiliary: No focal hepatic lesion. No biliary duct dilatation. Gallbladder is normal. Common bile duct is normal. Pancreas: Pancreas is normal. No ductal dilatation. No pancreatic inflammation. Spleen: Normal spleen Adrenals/urinary tract: Adrenal glands and kidneys are normal. The ureters and bladder normal. Stomach/Bowel: The GE junction extends below the diaphragm. The gastric body and antrum above the hemidiaphragm in the LEFT hemithorax. This herniated portion of stomach is fluid-filled and distended. The proximal stomach is below the hemidiaphragm also fluid-filled and distended. 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 Duodenum and small bowel are normal. Small bowel is collapsed. Appendix and cecum normal. The colon is collapsed. Vascular/Lymphatic: Abdominal aorta is normal caliber. No periportal or retroperitoneal adenopathy. No pelvic adenopathy. Reproductive: Uterus and adnexa unremarkable. Other: No free fluid. Musculoskeletal: No aggressive osseous lesion. IMPRESSION: 1. Gastric body and  antrum above the diaphragm while the GE junction and proximal stomach below the DG diaphragm. 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 Recommend general surgery consultation. 2. Stomach is fluid-filled and the small bowel and colon are decompressed consistent with GI obstruction. 3. Sliding-type hiatal hernia demonstrated on CT exam 6 years prior. Findings conveyed toELLIOTT Murray on 01/13/2020  at11:59. Electronically Signed   By: Suzy Bouchard M.D.   On: 01/13/2020 11:59    EKG: Independently reviewed.  Sinus tachycardia  Assessment/Plan Principal Problem:   Gastric volvulus Active Problems:   DM (diabetes mellitus) ([facility])   Hypokalemia   Hypertension goal BP (blood pressure) < 130/80  GERD   Hyperlipidemia LDL goal <100   Refractory nausea and vomiting   Rheumatoid arthritis ([place])     1. Gastric volvulus.  Noted on abdominal imaging.  After vomiting and receiving pain medications, patient is feeling better.  We will keep n.p.o. for now.  Case was reviewed by EDP with general surgery, Dr. Grandville Silos who will see the patient on transfer to [facility].  EDP also discussed case with gastroenterology Dr. Penelope Coop, who will see the patient for possible endoscopic reduction. 2. Diabetes.  She was previously on Metformin, but this was recently discontinued by her primary care physician.  Will monitor on sliding scale insulin. 3. Hypokalemia.  Secondary to GI losses.  Replace.  Check magnesium. 4. Rheumatoid arthritis.  On chronic methotrexate.  No evidence of joint flares at this time. 5. Hyperlipidemia.  Resume statin once able to take medications by mouth. 6. GERD.  Continue on PPI 7. Hypertension.  Holding oral agents.  Use hydralazine as needed  DVT prophylaxis: SCDs Code Status: Full code Family Communication: Discussed with daughter at the bedside Disposition Plan: Transfer to [facility] for further care Consults called: General surgery, Dr. Grandville Silos, gastroenterology,  Dr. Penelope Coop Admission status: Inpatient, telemetry  Kathie Dike MD [facility]   If 7PM-7AM, please contact night-coverage [email or web address]   01/13/2020, 1:39 PM

## 2020-01-13 NOTE — ED Notes (Signed)
Date and time results received: 01/13/20 1000   Test: potassium Critical Value: 2.7  Name of Provider Notified: Dr .Eulis Foster  Orders Received? Or Actions Taken?: No new orders given.

## 2020-01-13 NOTE — ED Notes (Signed)
Advised patient we needed urine specimen. 

## 2020-01-13 NOTE — Patient Instructions (Signed)
I spoke to the charge nurse, and they know that you are coming to the emergency room. Please go straight there.   They will take care of you and evaluate your abdominal pain, nausea and vomiting.   Once this acute illness is addressed, we can see you back in clinic for routine follow-up!   It was a pleasure to see you today. I want to create trusting relationships with patients to provide genuine, compassionate, and quality care. I value your feedback. If you receive a survey regarding your visit,  I greatly appreciate you taking time to fill this out.   Annitta Needs, PhD, ANP-BC [facility]

## 2020-01-13 NOTE — Consult Note (Signed)
Reason for Consult: Gastric volvulus Referring Physician: Dr. [name] is an 64 y.o. female.  HPI: Patient is a 64 year old white female who presented to the emergency room with an intermittent history of nausea and abdominal pain in the epigastric region for the last week.  Today's episode had worsened and she was sent to the emergency room by [name] of gastroenterology for evaluation.  A CT scan of the abdomen was performed which reveals a large organoaxial gastric volvulus through a diaphragmatic hernia.  Her GE junction is below the diaphragm.  She has had an episode of emesis in the emergency room and has had some relief of her abdominal pain.  She has a known history of a sliding hiatal hernia.  She denies any fever or chills.  Past Medical History:  Diagnosis Date  . Anxiety   . Constipation   . Depression   . DM (diabetes mellitus) ([place])   . Fatigue   . GERD (gastroesophageal reflux disease)   . HTN (hypertension)   . Hypercholesteremia   . IDA (iron deficiency anemia)     Past Surgical History:  Procedure Laterality Date  . COLONOSCOPY  01/01/10   Dr. Hansel Feinstein and sigmoid polyps, diminutive rectosigmoid polyp, tubular adenomas  . COLONOSCOPY N/A 12/06/2015   Procedure: COLONOSCOPY;  Surgeon: Daneil Dolin, MD;  [facility];  Service: Endoscopy;  Laterality: N/A;  730  . ESOPHAGOGASTRODUODENOSCOPY  12/14/09   Dr. Rourk:tight schatzki's ring s/p dilation/moderate size hiatal hernia  . GANGLION CYST EXCISION  05/22/2012   Procedure: REMOVAL GANGLION OF WRIST;  Surgeon: Carole Civil, MD;  Location: [facility];  Service: Orthopedics;  Laterality: Left;    Family History  Problem Relation Age of Onset  . Hypertension Mother   . Colon cancer Mother        unknown age of disease, still living, cancer-free   . Hypertension Father   . Stroke Father   . Heart disease Other   . Arthritis Other     Social History:  reports that she has been  smoking cigarettes. She has a 7.50 pack-year smoking history. She has never used smokeless tobacco. She reports current alcohol use. She reports that she does not use drugs.  Allergies:  Allergies  Allergen Reactions  . Gabapentin     palpitation    Medications: I have reviewed the patient's current medications.  Results for orders placed or performed during the hospital encounter of 01/13/20 (from the past 48 hour(s))  Comprehensive metabolic panel     Status: Abnormal   Collection Time: 01/13/20  9:26 AM  Result Value Ref Range   Sodium 140 135 - 145 mmol/L   Potassium 2.7 (LL) 3.5 - 5.1 mmol/L    Comment: CRITICAL RESULT CALLED TO, READ BACK BY AND VERIFIED WITH: KENDRICK,J AT 10:00AM ON 01/13/20 BY FESTERMAN,C    Chloride 91 (L) 98 - 111 mmol/L   CO2 30 22 - 32 mmol/L   Glucose, Bld 166 (H) 70 - 99 mg/dL    Comment: Glucose reference range applies only to samples taken after fasting for at least 8 hours.   BUN 14 8 - 23 mg/dL   Creatinine, Ser 0.82 0.44 - 1.00 mg/dL   Calcium 10.2 8.9 - 10.3 mg/dL   Total Protein 9.0 (H) 6.5 - 8.1 g/dL   Albumin 4.4 3.5 - 5.0 g/dL   AST 20 15 - 41 U/L   ALT 22 0 - 44 U/L  Alkaline Phosphatase 76 38 - 126 U/L   Total Bilirubin 0.9 0.3 - 1.2 mg/dL   GFR calc non Af Amer >60 >60 mL/min   GFR calc Af Amer >60 >60 mL/min   Anion gap 19 (H) 5 - 15    Comment: Performed at [facility], [address], [place], [place] [address]  Protime-INR     Status: None   Collection Time: 01/13/20  9:26 AM  Result Value Ref Range   Prothrombin Time 13.2 11.4 - 15.2 seconds   INR 1.0 0.8 - 1.2    Comment: (NOTE) INR goal varies based on device and disease states. Performed at [facility], [address] [place], [place] [address]   CBC with Differential     Status: Abnormal   Collection Time: 01/13/20  9:26 AM  Result Value Ref Range   WBC 10.5 4.0 - 10.5 K/uL   RBC 5.38 (H) 3.87 - 5.11 MIL/uL   Hemoglobin 15.5 (H) 12.0 - 15.0 g/dL   HCT 46.2 (H)  36 - 46 %   MCV 85.9 80.0 - 100.0 fL   MCH 28.8 26.0 - 34.0 pg   MCHC 33.5 30.0 - 36.0 g/dL   RDW 14.4 11.5 - 15.5 %   Platelets 397 150 - 400 K/uL   nRBC 0.0 0.0 - 0.2 %   Neutrophils Relative % 71 %   Neutro Abs 7.4 1.7 - 7.7 K/uL   Lymphocytes Relative 21 %   Lymphs Abs 2.2 0.7 - 4.0 K/uL   Monocytes Relative 7 %   Monocytes Absolute 0.7 0 - 1 K/uL   Eosinophils Relative 1 %   Eosinophils Absolute 0.1 0 - 0 K/uL   Basophils Relative 0 %   Basophils Absolute 0.0 0 - 0 K/uL   Immature Granulocytes 0 %   Abs Immature Granulocytes 0.04 0.00 - 0.07 K/uL    Comment: Performed at [facility], [address] [place], [place] [address]  Lipase, blood     Status: None   Collection Time: 01/13/20  9:26 AM  Result Value Ref Range   Lipase 25 11 - 51 U/L    Comment: Performed at [facility], [address], [place], [place] [address]  Ethanol     Status: None   Collection Time: 01/13/20  9:27 AM  Result Value Ref Range   Alcohol, Ethyl (B) <10 <10 mg/dL    Comment: (NOTE) Lowest detectable limit for serum alcohol is 10 mg/dL.  For medical purposes only. Performed at [facility], [address], [place],  [address]   Urinalysis, Routine w reflex microscopic     Status: Abnormal   Collection Time: 01/13/20 11:54 AM  Result Value Ref Range   Color, Urine YELLOW YELLOW   APPearance CLEAR CLEAR   Specific Gravity, Urine 1.036 (H) 1.005 - 1.030   pH 8.0 5.0 - 8.0   Glucose, UA NEGATIVE NEGATIVE mg/dL   Hgb urine dipstick NEGATIVE NEGATIVE   Bilirubin Urine NEGATIVE NEGATIVE   Ketones, ur 20 (A) NEGATIVE mg/dL   Protein, ur 30 (A) NEGATIVE mg/dL   Nitrite NEGATIVE NEGATIVE   Leukocytes,Ua NEGATIVE NEGATIVE   RBC / HPF 0-5 0 - 5 RBC/hpf   WBC, UA 0-5 0 - 5 WBC/hpf   Bacteria, UA NONE SEEN NONE SEEN   Squamous Epithelial / LPF 0-5 0 - 5   Mucus PRESENT    Hyaline Casts, UA PRESENT     Comment: Performed at [facility], [address],  [place], [place] [address]  Urine  rapid drug screen (hosp performed)     Status: None   Collection Time: 01/13/20 11:56 AM  Result Value Ref Range   Opiates NONE DETECTED NONE DETECTED   Cocaine NONE DETECTED NONE DETECTED   Benzodiazepines NONE DETECTED NONE DETECTED   Amphetamines NONE DETECTED NONE DETECTED   Tetrahydrocannabinol NONE DETECTED NONE DETECTED   Barbiturates NONE DETECTED NONE DETECTED    Comment: (NOTE) DRUG SCREEN FOR MEDICAL PURPOSES ONLY.  IF CONFIRMATION IS NEEDED FOR ANY PURPOSE, NOTIFY LAB WITHIN 5 DAYS.  LOWEST DETECTABLE LIMITS FOR URINE DRUG SCREEN Drug Class                     Cutoff (ng/mL) Amphetamine and metabolites    1000 Barbiturate and metabolites    200 Benzodiazepine                 711 Tricyclics and metabolites     300 Opiates and metabolites        300 Cocaine and metabolites        300 THC                            50 Performed at [facility], [address] [place], [place] [address]     CT Abdomen Pelvis W Contrast  Result Date: 01/13/2020 CLINICAL DATA:  Abdominal abscess or infection expected. Abdominal tenderness and nausea and vomiting. Tachycardia. EXAM: CT ABDOMEN AND PELVIS WITH CONTRAST TECHNIQUE: Multidetector CT imaging of the abdomen and pelvis was performed using the standard protocol following bolus administration of intravenous contrast. CONTRAST:  120mL OMNIPAQUE IOHEXOL 300 MG/ML  SOLN COMPARISON:  11/25/2013 FINDINGS: Lower chest: Lung bases are clear. Large gastric hernia hernia in the LEFT hemithorax described in the stomach section. Hepatobiliary: No focal hepatic lesion. No biliary duct dilatation. Gallbladder is normal. Common bile duct is normal. Pancreas: Pancreas is normal. No ductal dilatation. No pancreatic inflammation. Spleen: Normal spleen Adrenals/urinary tract: Adrenal glands and kidneys are normal. The ureters and bladder normal. Stomach/Bowel: The GE junction extends below the diaphragm. The gastric body and antrum above the hemidiaphragm  in the LEFT hemithorax. This herniated portion of stomach is fluid-filled and distended. The proximal stomach is below the hemidiaphragm also fluid-filled and distended. 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 Duodenum and small bowel are normal. Small bowel is collapsed. Appendix and cecum normal. The colon is collapsed. Vascular/Lymphatic: Abdominal aorta is normal caliber. No periportal or retroperitoneal adenopathy. No pelvic adenopathy. Reproductive: Uterus and adnexa unremarkable. Other: No free fluid. Musculoskeletal: No aggressive osseous lesion. IMPRESSION: 1. Gastric body and antrum above the diaphragm while the GE junction and proximal stomach below the DG diaphragm. 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 Recommend general surgery consultation. 2. Stomach is fluid-filled and the small bowel and colon are decompressed consistent with GI obstruction. 3. Sliding-type hiatal hernia demonstrated on CT exam 6 years prior. Findings conveyed toELLIOTT WENTZ on 01/13/2020  at11:59. Electronically Signed   By: Suzy Bouchard M.D.   On: 01/13/2020 11:59    ROS:  Pertinent items are noted in HPI.  Blood pressure (!) 169/110, pulse 96, temperature (!) 97.5 F (36.4 C), temperature source Oral, resp. rate 15, height 4\' 11"  (1.499 m), weight 58 kg, SpO2 99 %. Physical Exam: Pleasant white female no acute distress Head is normocephalic, atraumatic Lungs clear to auscultation with good breath sounds bilaterally Heart examination  reveals a regular rate and rhythm without S3, S4, murmurs Abdomen is soft and flat.  No hepatosplenomegaly or tenderness is noted.  Limited bowel sounds are appreciated.  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 Plan: Surgical repair of this volvulus and diaphragmatic hernia are outside my medical expertise.  General surgery in [place] has been consulted.   They have suggested a GI consultation for possible endoscopic reduction of the volvulus.  Patient will be transferred to [facility] for further management and treatment.  Jaime Murray 01/13/2020, 1:06 PM

## 2020-01-13 NOTE — Progress Notes (Signed)
Primary Care Physician:  Fayrene Helper, MD  Referring Physician: Dr. Moshe Cipro  Primary Gastroenterologist:  Dr. Gala Romney   Chief Complaint  Patient presents with   Gastroesophageal Reflux    c/o vomiting few times a week prior and this week has been happening daily, last for few hours.    loss of weight    133lbs on monday, loss of appetite    HPI:   Jaime Murray is a 64 y.o. female presenting today at the request of Dr. Moshe Cipro due to abdominal pain, nausea and vomiting, weight loss.  Last EGD in 2011 with dilation of Schatzki's ring. Personal history of adenomas and family history of colon cancer with next surveillance colonoscopy due 2022.  Daughter is with patient in room. Patient standing up, unable to get comfortable sitting down. Obviously uncomfortable. Notes first episode of LUQ abdominal pain radiating to back a few months ago, with associated N/V. Lasted about a day then resolved. This past week, she has had 3 episodes of severe LUQ abdominal pain radiating around to her back, associated nausea and vomiting. She notes the most recent episode starting yesterday around 2pm. She has been unable to tolerate any oral intake since that time, with worsening abdominal pain and refractory N/V.    No fever or chills. Pain is intermittent. Sharp, stabbing. Pain only improved with vomiting. No hematemesis. Denies rectal bleeding. Baseline weight in the 140s about a year ago. 133 at PCP office two days ago, now 128.   She drinks a few beers during the week but main intake is on Friday and Saturday, drinking a 6 pack each Friday and Saturday.   Past Medical History:  Diagnosis Date   Anxiety    Constipation    Depression    DM (diabetes mellitus) (HCC)    Fatigue    GERD (gastroesophageal reflux disease)    HTN (hypertension)    Hypercholesteremia    IDA (iron deficiency anemia)     Past Surgical History:  Procedure Laterality Date   COLONOSCOPY  01/01/10    Dr. Hansel Feinstein and sigmoid polyps, diminutive rectosigmoid polyp, tubular adenomas   COLONOSCOPY N/A 12/06/2015   Procedure: COLONOSCOPY;  Surgeon: Daneil Dolin, MD;  [facility]: [facility];  Service: Endoscopy;  Laterality: N/A;  730   ESOPHAGOGASTRODUODENOSCOPY  12/14/09   Dr. Rourk:tight schatzki's ring s/p dilation/moderate size hiatal hernia   GANGLION CYST EXCISION  05/22/2012   Procedure: REMOVAL GANGLION OF WRIST;  Surgeon: Carole Civil, MD;  Location: [facility];  Service: Orthopedics;  Laterality: Left;    Current Outpatient Medications  Medication Sig Dispense Refill   acetaminophen (TYLENOL) 325 MG tablet Take 650 mg by mouth every 6 (six) hours as needed. For pain     aspirin 81 MG tablet Take 81 mg by mouth daily.       B-D TB SYRINGE 1CC/27GX1/2" 27G X 1/2" 1 ML MISC 1 each by Other route as directed.     benazepril (LOTENSIN) 5 MG tablet TAKE 1 TABLET DAILY 90 tablet 3   cetirizine (ZYRTEC) 10 MG tablet TAKE 1 TABLET DAILY 90 tablet 3   cloNIDine (CATAPRES) 0.1 MG tablet TAKE 1 TABLET AT BEDTIME 90 tablet 0   folic acid (FOLVITE) 1 MG tablet Take 3 mg by mouth daily.     hydrochlorothiazide (HYDRODIURIL) 25 MG tablet TAKE 1 TABLET DAILY 90 tablet 3   lovastatin (MEVACOR) 40 MG tablet Take 1 tablet (40 mg total) by mouth at bedtime.  90 tablet 3   metFORMIN (GLUCOPHAGE) 500 MG tablet Take 1 tablet (500 mg total) by mouth daily with breakfast. 90 tablet 2   Methotrexate Sodium (METHOTREXATE, PF,) 50 MG/2ML injection Inject 0.9 mLs into the muscle once a week. Every Sunday     Methotrexate, PF, 20 MG/0.4ML SOAJ Inject 20 mg into the skin once a week. Daughter gives injection once weekly     Omega 3 1000 MG CAPS Take 1 capsule by mouth daily.     omeprazole (PRILOSEC) 20 MG capsule TAKE 1 CAPSULE DAILY 90 capsule 3   potassium chloride SA (KLOR-CON M20) 20 MEQ tablet Take 1 tablet (20 mEq total) by mouth 2 (two) times daily. (Patient taking differently: Take  20 mEq by mouth daily. ) 180 tablet 0   Probiotic Product (PROBIOTIC DAILY) CAPS Take 1 capsule by mouth daily.     sertraline (ZOLOFT) 50 MG tablet TAKE 1 TABLET DAILY ( DOSE INCREASE ) 90 tablet 3   Current Facility-Administered Medications  Medication Dose Route Frequency Provider Last Rate Last Admin   Influenza (>/= 3 years) inactive virus vaccine (FLVIRIN/FLUZONE) injection SUSP 0.5 mL  0.5 mL Intramuscular Once Fayrene Helper, MD        Allergies as of 01/13/2020 - Review Complete 01/13/2020  Allergen Reaction Noted   Gabapentin  06/23/2017    Family History  Problem Relation Age of Onset   Hypertension Mother    Colon cancer Mother        unknown age of disease, still living, cancer-free    Hypertension Father    Stroke Father    Heart disease Other    Arthritis Other     Social History   Socioeconomic History   Marital status: Married    Spouse name: Not on file   Number of children: Not on file   Years of education: 12   Highest education level: Not on file  Occupational History    Employer: [employer]  Tobacco Use   Smoking status: Current Every Day Smoker    Packs/day: 0.25    Years: 30.00    Pack years: 7.50    Types: Cigarettes   Smokeless tobacco: Never Used  Substance and Sexual Activity   Alcohol use: Yes    Comment: 6 pack on Friday and Saturday, ocasional beers during the week    Drug use: No   Sexual activity: Yes    Birth control/protection: Post-menopausal  Other Topics Concern   Not on file  Social History Narrative   Not on file   Social Determinants of Health   Financial Resource Strain:    Difficulty of Paying Living Expenses:   Food Insecurity:    Worried About Charity fundraiser in the Last Year:    Arboriculturist in the Last Year:   Transportation Needs:    Film/video editor (Medical):    Lack of Transportation (Non-Medical):   Physical Activity:    Days of Exercise per  Week:    Minutes of Exercise per Session:   Stress:    Feeling of Stress :   Social Connections:    Frequency of Communication with Friends and Family:    Frequency of Social Gatherings with Friends and Family:    Attends Religious Services:    Active Member of Clubs or Organizations:    Attends Archivist Meetings:    Marital Status:   Intimate Partner Violence:    Fear of Current or Ex-Partner:  Emotionally Abused:    Physically Abused:    Sexually Abused:     Review of Systems: As mentioned in HPI  Physical Exam: BP (!) 137/96    Pulse (!) 129    Temp (!) 97 F (36.1 C)    Ht 4\' 11"  (1.499 m)    Wt 128 lb 3.2 oz (58.2 kg)    BMI 25.89 kg/m  General:   Alert and oriented. In obvious discomfort, acutely ill appearing.  Head:  Normocephalic and atraumatic. Eyes:  Without icterus, sclera clear and conjunctiva pink.  Ears:  Normal auditory acuity. Mouth:  Mask in place Lungs:  Clear to auscultation bilaterally.  Heart:  S1, S2 present, bounding. Tachycardic in the 130s.  Abdomen:  Non-distended. Difficulty hearing bowel sounds. TTP LUQ, supraumbilical, no guarding Rectal:  Deferred  Msk:  Symmetrical without gross deformities. Normal posture. Extremities:  Without edema. Neurologic:  Alert and  oriented x4;    ASSESSMENT: Jaime Murray is a 64 y.o. female presenting today with worsening episodes of abdominal pain, N/V, now with episode since yesterday around 2pm and refractory N/V. She is tachycardic and acutely ill-appearing, standing up during most of visit as laying down and sitting worsens pain. She does report occasional beers during the week but a 6 pack on Friday and Saturdays.   With her history, concern raised for acute pancreatitis in setting of ETOH use. Unable to exclude other processes such as obstruction, less likely ischemic. Not consistent with diverticulitis. With her constellation of symptoms and acuity, she was sent to the ED. I  spoke with Magda Paganini, charge RN at ED and informed of patient's upcoming arrival. Daughter is with patient and taking her there immediately.    PLAN:  To ED now  Follow-up after acute illness addressed   Annitta Needs, PhD, ANP-BC [facility]

## 2020-01-13 NOTE — Progress Notes (Signed)
Cc'ed to pcp °

## 2020-01-13 NOTE — ED Provider Notes (Addendum)
[facility] Provider Note   CSN: [id number] Arrival date & time: 01/13/20  0840     History Chief Complaint  Patient presents with  . Abdominal Pain    Jaime Murray is a 64 y.o. female.  HPI She presents from the GI office where she was being evaluated for abdominal pain.  This was a first consultation, referred by PCP.  Patient has had intermittent abdominal pain, for at least a week, and is presenting now with current abdominal pain that is worsening compared to prior episodes.  She has previously seen GI, and had esophageal dilatation and colonoscopy, but no recent problems with those issues.  She denies fever, chills, hematemesis, cough, shortness of breath, weakness or dizziness.  She denies alcohol, frequently.  No other known modifying factors.    Past Medical History:  Diagnosis Date  . Anxiety   . Constipation   . Depression   . DM (diabetes mellitus) ([place])   . Fatigue   . GERD (gastroesophageal reflux disease)   . HTN (hypertension)   . Hypercholesteremia   . IDA (iron deficiency anemia)     Patient Active Problem List   Diagnosis Date Noted  . Abdominal pain, left upper quadrant 01/13/2020  . Refractory nausea and vomiting 01/13/2020  . Bladder pain 01/11/2020  . Abdominal pain in female 01/11/2020  . Pelvic pain in female 01/11/2020  . Lung nodules 01/11/2020  . Weight loss, non-intentional 01/11/2020  . Poor appetite 01/11/2020  . Need for immunization against influenza 04/11/2019  . Ankle joint pain 02/19/2018  . Family history of colon cancer   . FH: colon cancer 11/17/2015  . History of colonic polyps 11/17/2015  . Overweight (BMI 25.0-29.9) 10/16/2015  . Annual physical exam 11/23/2014  . Generalized osteoarthritis 04/28/2014  . Lumbar pain 06/10/2013  . Cervical back pain with evidence of disc disease 04/09/2012  . Hyperlipidemia LDL goal <100 04/09/2012  . Allergic rhinitis 09/03/2011  . Iron deficiency anemia  09/19/2010  . GERD 09/19/2010  . ADENOMATOUS COLONIC POLYP 02/27/2010  . DM (diabetes mellitus) ([place]) 07/26/2009  . NICOTINE ADDICTION 11/24/2007  . Hypertension goal BP (blood pressure) < 130/80 11/24/2007    Past Surgical History:  Procedure Laterality Date  . COLONOSCOPY  01/01/10   Dr. Hansel Feinstein and sigmoid polyps, diminutive rectosigmoid polyp, tubular adenomas  . COLONOSCOPY N/A 12/06/2015   Procedure: COLONOSCOPY;  Surgeon: Daneil Dolin, MD;  Location: [facility];  Service: Endoscopy;  Laterality: N/A;  730  . ESOPHAGOGASTRODUODENOSCOPY  12/14/09   Dr. Rourk:tight schatzki's ring s/p dilation/moderate size hiatal hernia  . GANGLION CYST EXCISION  05/22/2012   Procedure: REMOVAL GANGLION OF WRIST;  Surgeon: Carole Civil, MD;  Location: [facility];  Service: Orthopedics;  Laterality: Left;     OB History   No obstetric history on file.     Family History  Problem Relation Age of Onset  . Hypertension Mother   . Colon cancer Mother        unknown age of disease, still living, cancer-free   . Hypertension Father   . Stroke Father   . Heart disease Other   . Arthritis Other     Social History   Tobacco Use  . Smoking status: Current Every Day Smoker    Packs/day: 0.25    Years: 30.00    Pack years: 7.50    Types: Cigarettes  . Smokeless tobacco: Never Used  Substance Use Topics  . Alcohol use: Yes  Comment: 6 pack on Friday and Saturday, ocasional beers during the week   . Drug use: No    Home Medications Prior to Admission medications   Medication Sig Start Date End Date Taking? Authorizing Provider  acetaminophen (TYLENOL) 325 MG tablet Take 650 mg by mouth every 6 (Murray) hours as needed. For pain    [provider]  aspirin 81 MG tablet Take 81 mg by mouth daily.      [provider]  B-D TB SYRINGE 1CC/27GX1/2" 27G X 1/2" 1 ML MISC 1 each by Other route as directed. 10/16/19   [provider]  benazepril (LOTENSIN) 5 MG  tablet TAKE 1 TABLET DAILY 12/21/19   Fayrene Helper, MD  cetirizine (ZYRTEC) 10 MG tablet TAKE 1 TABLET DAILY 04/05/19   Fayrene Helper, MD  cloNIDine (CATAPRES) 0.1 MG tablet TAKE 1 TABLET AT BEDTIME 12/21/19   Fayrene Helper, MD  folic acid (FOLVITE) 1 MG tablet Take 3 mg by mouth daily.    [provider]  hydrochlorothiazide (HYDRODIURIL) 25 MG tablet TAKE 1 TABLET DAILY 10/12/19   Perlie Mayo, NP  lovastatin (MEVACOR) 40 MG tablet Take 1 tablet (40 mg total) by mouth at bedtime. 01/19/19   Fayrene Helper, MD  metFORMIN (GLUCOPHAGE) 500 MG tablet Take 1 tablet (500 mg total) by mouth daily with breakfast. 01/19/19   Fayrene Helper, MD  Methotrexate Sodium (METHOTREXATE, PF,) 50 MG/2ML injection Inject 0.9 mLs into the muscle once a week. Every Sunday 09/28/19   [provider]  Methotrexate, PF, 20 MG/0.4ML SOAJ Inject 20 mg into the skin once a week. Daughter gives injection once weekly    [provider]  Omega 3 1000 MG CAPS Take 1 capsule by mouth daily.    [provider]  omeprazole (PRILOSEC) 20 MG capsule TAKE 1 CAPSULE DAILY 10/12/19   Perlie Mayo, NP  potassium chloride SA (KLOR-CON M20) 20 MEQ tablet Take 1 tablet (20 mEq total) by mouth 2 (two) times daily. Patient taking differently: Take 20 mEq by mouth daily.  08/06/16   Fayrene Helper, MD  Probiotic Product (PROBIOTIC DAILY) CAPS Take 1 capsule by mouth daily.    [provider]  sertraline (ZOLOFT) 50 MG tablet TAKE 1 TABLET DAILY ( DOSE INCREASE ) 04/26/19   Fayrene Helper, MD    Allergies    Gabapentin  Review of Systems   Review of Systems  All other systems reviewed and are negative.   Physical Exam Updated Vital Signs BP (!) 169/110   Pulse 96   Temp (!) 97.5 F (36.4 C) (Oral)   Resp 15   Ht 4\' 11"  (1.499 m)   Wt 58 kg   SpO2 99%   BMI 25.83 kg/m   Physical Exam Vitals and nursing note reviewed.  Constitutional:       Appearance: She is well-developed.  HENT:     Head: Normocephalic and atraumatic.  Eyes:     Conjunctiva/sclera: Conjunctivae normal.     Pupils: Pupils are equal, round, and reactive to light.  Neck:     Trachea: Phonation normal.  Cardiovascular:     Rate and Rhythm: Normal rate and regular rhythm.  Pulmonary:     Effort: Pulmonary effort is normal.     Breath sounds: Normal breath sounds.  Chest:     Chest wall: No tenderness.  Abdominal:     General: There is no distension.     Palpations:  Abdomen is soft.     Tenderness: There is abdominal tenderness (diffuse, moderate). There is no guarding.  Musculoskeletal:        General: Normal range of motion.     Cervical back: Normal range of motion and neck supple.  Skin:    General: Skin is warm and dry.  Neurological:     Mental Status: She is alert and oriented to person, place, and time.     Motor: No abnormal muscle tone.  Psychiatric:        Mood and Affect: Mood normal.        Behavior: Behavior normal.        Thought Content: Thought content normal.        Judgment: Judgment normal.     ED Results / Procedures / Treatments   Labs (all labs ordered are listed, but only abnormal results are displayed) Labs Reviewed  COMPREHENSIVE METABOLIC PANEL - Abnormal; Notable for the following components:      Result Value   Potassium 2.7 (*)    Chloride 91 (*)    Glucose, Bld 166 (*)    Total Protein 9.0 (*)    Anion gap 19 (*)    All other components within normal limits  CBC WITH DIFFERENTIAL/PLATELET - Abnormal; Notable for the following components:   RBC 5.38 (*)    Hemoglobin 15.5 (*)    HCT 46.2 (*)    All other components within normal limits  URINALYSIS, ROUTINE W REFLEX MICROSCOPIC - Abnormal; Notable for the following components:   Specific Gravity, Urine 1.036 (*)    Ketones, ur 20 (*)    Protein, ur 30 (*)    All other components within normal limits  SARS CORONAVIRUS 2 BY RT PCR (HOSPITAL ORDER,  [facility])  PROTIME-INR  RAPID URINE DRUG SCREEN, HOSP PERFORMED  ETHANOL  LIPASE, BLOOD    EKG None  Radiology CT Abdomen Pelvis W Contrast  Result Date: 01/13/2020 CLINICAL DATA:  Abdominal abscess or infection expected. Abdominal tenderness and nausea and vomiting. Tachycardia. EXAM: CT ABDOMEN AND PELVIS WITH CONTRAST TECHNIQUE: Multidetector CT imaging of the abdomen and pelvis was performed using the standard protocol following bolus administration of intravenous contrast. CONTRAST:  160mL OMNIPAQUE IOHEXOL 300 MG/ML  SOLN COMPARISON:  11/25/2013 FINDINGS: Lower chest: Lung bases are clear. Large gastric hernia hernia in the LEFT hemithorax described in the stomach section. Hepatobiliary: No focal hepatic lesion. No biliary duct dilatation. Gallbladder is normal. Common bile duct is normal. Pancreas: Pancreas is normal. No ductal dilatation. No pancreatic inflammation. Spleen: Normal spleen Adrenals/urinary tract: Adrenal glands and kidneys are normal. The ureters and bladder normal. Stomach/Bowel: The GE junction extends below the diaphragm. The gastric body and antrum above the hemidiaphragm in the LEFT hemithorax. This herniated portion of stomach is fluid-filled and distended. The proximal stomach is below the hemidiaphragm also fluid-filled and distended. 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 Duodenum and small bowel are normal. Small bowel is collapsed. Appendix and cecum normal. The colon is collapsed. Vascular/Lymphatic: Abdominal aorta is normal caliber. No periportal or retroperitoneal adenopathy. No pelvic adenopathy. Reproductive: Uterus and adnexa unremarkable. Other: No free fluid. Musculoskeletal: No aggressive osseous lesion. IMPRESSION: 1. Gastric body and antrum above the diaphragm while the GE junction and proximal stomach below the DG diaphragm. 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 Recommend general surgery  consultation. 2. Stomach is fluid-filled and the small bowel and colon are decompressed consistent with GI  obstruction. 3. Sliding-type hiatal hernia demonstrated on CT exam 6 years prior. Findings conveyed toELLIOTT Cher Murray on 01/13/2020  at11:59. Electronically Signed   By: Suzy Bouchard M.D.   On: 01/13/2020 11:59    Procedures .Critical Care Performed by: Daleen Bo, MD Authorized by: Daleen Bo, MD   Critical care provider statement:    Critical care time (minutes):  35   Critical care start time:  01/13/2020 9:15 AM   Critical care end time:  01/13/2020 12:11 PM   Critical care time was exclusive of:  Separately billable procedures and treating other patients   Critical care was necessary to treat or prevent imminent or life-threatening deterioration of the following conditions:  Metabolic crisis (Gastric volvulus)   Critical care was time spent personally by me on the following activities:  Blood draw for specimens, development of treatment plan with patient or surrogate, discussions with consultants, evaluation of patient's response to treatment, examination of patient, obtaining history from patient or surrogate, ordering and performing treatments and interventions, ordering and review of laboratory studies, pulse oximetry, re-evaluation of patient's condition, review of old charts and ordering and review of radiographic studies   (including critical care time)  Medications Ordered in ED Medications  fentaNYL (SUBLIMAZE) injection 100 mcg (100 mcg Intravenous Given 01/13/20 0947)  iohexol (OMNIPAQUE) 9 MG/ML oral solution (has no administration in time range)  potassium chloride 10 mEq in 100 mL IVPB (10 mEq Intravenous New Bag/Given 01/13/20 1026)  sodium chloride 0.9 % bolus 1,000 mL (1,000 mLs Intravenous New Bag/Given 01/13/20 0945)  ondansetron (ZOFRAN) injection 4 mg (4 mg Intravenous Given 01/13/20 0949)  iohexol (OMNIPAQUE) 300 MG/ML solution 100 mL (100 mLs Intravenous  Contrast Given 01/13/20 1106)    ED Course  I have reviewed the triage vital signs and the nursing notes.  Pertinent labs & imaging results that were available during my care of the patient were reviewed by me and considered in my medical decision making (see chart for details).  Clinical Course as of Jan 13 1216  Thu Jan 13, 2020  1159 Case discussed with radiologist, Dr. Ilda Foil.  He is interpreting the CT, as gastric volvulus.  He does not think it has components of ischemia.  Will obtain surgical consultation.   [EW]  1159 AST: 20 [EW]  1210 Case discussed with surgery, Dr. Arnoldo Morale who will evaluate the patient for operative intervention to treat gastric volvulus.   [EW]    Clinical Course User Index [EW] Daleen Bo, MD   MDM Rules/Calculators/A&P                           Patient Vitals for the past 24 hrs:  BP Temp Temp src Pulse Resp SpO2 Height Weight  01/13/20 1100 (!) 169/110 -- -- 96 15 99 % -- --  01/13/20 1027 (!) 139/104 -- -- (!) 108 (!) 23 93 % -- --  01/13/20 1000 (!) 139/104 -- -- (!) 101 -- 92 % -- --  01/13/20 0901 (!) 163/114 (!) 97.5 F (36.4 C) Oral (!) 118 18 96 % -- --  01/13/20 0858 -- -- -- -- -- -- 4\' 11"  (1.499 m) 58 kg    12:17 PM Reevaluation with update and discussion. After initial assessment and treatment, an updated evaluation reveals she states that she is better, but still somewhat nauseated.  Patient and daughter, updated on findings and plan. Daleen Bo   Medical Decision Making:  This patient is presenting  for evaluation of recurrent abdominal pain and vomiting, which does require a range of treatment options, and is a complaint that involves a high risk of morbidity and mortality. The differential diagnoses include diverticulitis, bowel obstruction, acute intra-abdominal infection, pancreatitis. I decided to review old records, and in summary middle-aged female, with ongoing pain with vomiting, and worsening symptoms.  I did not  require additional historical information from anyone.  Clinical Laboratory Tests Ordered, included CBC, Metabolic panel, Urinalysis and Lipase, alcohol level, urine drug screen. Review indicates reassuring findings without evidence for acute infectious process.  Hypokalemia present requiring treatment.  Mild hyperglycemia not requiring treatment.. Radiologic Tests Ordered, included CT abdomen pelvis.  I independently Visualized: CT images, which show gastric volvulus.   Critical Interventions-clinical evaluation, laboratory testing, CT imaging, pain medication, antiemetic medication, potassium intravenous rounds x3.  After These Interventions, the Patient was reevaluated and was found to have a gastric volvulus requiring surgical intervention and hospitalization with treatment for hypokalemia.  CRITICAL CARE-yes Performed by: Daleen Bo  Nursing Notes Reviewed/ Care Coordinated Applicable Imaging Reviewed Interpretation of Laboratory Data incorporated into ED treatment        Final Clinical Impression(s) / ED Diagnoses Final diagnoses:  Gastric volvulus  Hypokalemia    Rx / DC Orders ED Discharge Orders    None       Daleen Bo, MD 01/13/20 1217  12:22 PM -Dr. Arnoldo Morale has reviewed the chart and indicates that the patient will require transfer for treatment at a advanced care facility, [facility].  He will evaluate the patient in the ED prior to transfer.  12:38 PM-case discussed with general surgery, at [facility].  Case details discussed.  They recommend admit to [facility], by medical doctor, obtain GI consultation for endoscopic treatment of gastric volvulus.  Surgery will be happy to see the patient, as a Optometrist if needed.  12:39 PM-Consult complete with hospitalist. Patient case explained and discussed.  He agrees to admit patient for further evaluation and treatment. Call ended at 1:10 PM  12:58 PM-case discussed with GI in [place], at [facility].  Dr. Penelope Coop will see the patient as a consultant for possible endoscopic treatment of gastric volvulus.   Daleen Bo, MD 01/13/20 [phone]

## 2020-01-13 NOTE — ED Triage Notes (Signed)
Pt sent from GI office for tachycardia, abd tenderness, n/v for the past 24 hours.  Reports several episodes over the past week and reports some weight loss.  Reports drinks alcohol on the weekends.  Says drinks a 6 pack over the weekend.

## 2020-01-14 ENCOUNTER — Encounter: Payer: Self-pay | Admitting: Family Medicine

## 2020-01-14 ENCOUNTER — Encounter (HOSPITAL_COMMUNITY): Payer: Self-pay | Admitting: Internal Medicine

## 2020-01-14 ENCOUNTER — Inpatient Hospital Stay (HOSPITAL_COMMUNITY)

## 2020-01-14 LAB — CBC
HCT: 43.1 % (ref 36.0–46.0)
Hemoglobin: 14.3 g/dL (ref 12.0–15.0)
MCH: 28.5 pg (ref 26.0–34.0)
MCHC: 33.2 g/dL (ref 30.0–36.0)
MCV: 86 fL (ref 80.0–100.0)
Platelets: 342 10*3/uL (ref 150–400)
RBC: 5.01 MIL/uL (ref 3.87–5.11)
RDW: 14.5 % (ref 11.5–15.5)
WBC: 12.4 10*3/uL — ABNORMAL HIGH (ref 4.0–10.5)
nRBC: 0 % (ref 0.0–0.2)

## 2020-01-14 LAB — GLUCOSE, CAPILLARY
Glucose-Capillary: 102 mg/dL — ABNORMAL HIGH (ref 70–99)
Glucose-Capillary: 105 mg/dL — ABNORMAL HIGH (ref 70–99)
Glucose-Capillary: 107 mg/dL — ABNORMAL HIGH (ref 70–99)
Glucose-Capillary: 109 mg/dL — ABNORMAL HIGH (ref 70–99)
Glucose-Capillary: 145 mg/dL — ABNORMAL HIGH (ref 70–99)
Glucose-Capillary: 152 mg/dL — ABNORMAL HIGH (ref 70–99)
Glucose-Capillary: 176 mg/dL — ABNORMAL HIGH (ref 70–99)

## 2020-01-14 LAB — COMPREHENSIVE METABOLIC PANEL
ALT: 20 U/L (ref 0–44)
AST: 18 U/L (ref 15–41)
Albumin: 3.8 g/dL (ref 3.5–5.0)
Alkaline Phosphatase: 66 U/L (ref 38–126)
Anion gap: 15 (ref 5–15)
BUN: 9 mg/dL (ref 8–23)
CO2: 24 mmol/L (ref 22–32)
Calcium: 9.2 mg/dL (ref 8.9–10.3)
Chloride: 100 mmol/L (ref 98–111)
Creatinine, Ser: 0.55 mg/dL (ref 0.44–1.00)
GFR calc Af Amer: >60 mL/min (ref >60)
GFR calc non Af Amer: >60 mL/min (ref >60)
Glucose, Bld: 180 mg/dL — ABNORMAL HIGH (ref 70–99)
Potassium: 2.9 mmol/L — ABNORMAL LOW (ref 3.5–5.1)
Sodium: 139 mmol/L (ref 135–145)
Total Bilirubin: 0.7 mg/dL (ref 0.3–1.2)
Total Protein: 7.6 g/dL (ref 6.5–8.1)

## 2020-01-14 LAB — HIV ANTIBODY (ROUTINE TESTING W REFLEX): HIV Screen 4th Generation wRfx: NONREACTIVE

## 2020-01-14 MED ORDER — SODIUM CHLORIDE 0.9 % IV SOLN: INTRAVENOUS | Status: DC

## 2020-01-14 MED ORDER — POTASSIUM CHLORIDE 10 MEQ/100ML IV SOLN
10.0000 meq | INTRAVENOUS | Status: AC
  Administered 2020-01-14 (×4): 10 meq via INTRAVENOUS
  Filled 2020-01-14 (×4): qty 100

## 2020-01-14 MED ORDER — HYDRALAZINE HCL 20 MG/ML IJ SOLN: 10.0000 mg | Freq: Four times a day (QID) | INTRAMUSCULAR | Status: DC | PRN

## 2020-01-14 MED ORDER — POTASSIUM CHLORIDE IN NACL 40-0.9 MEQ/L-% IV SOLN
INTRAVENOUS | Status: DC
  Filled 2020-01-14 (×8): qty 1000

## 2020-01-14 NOTE — Assessment & Plan Note (Signed)
Asked:confirms currently smokes cigarettes Assess: willing to quit and  cutting back Advise: needs to QUIT to reduce risk of cancer, cardio and cerebrovascular disease Assist: counseled for 5 minutes  Arrange: follow up in 3 months

## 2020-01-14 NOTE — Progress Notes (Signed)
PROGRESS NOTE    Murray WINCHEL  BTD:[id number] DOB: 12/27/1955 DOA: 01/13/2020 PCP: Fayrene Helper, MD   Brief Narrative:  Jaime Murray is a 64 y.o. female with medical history significant of diabetes, hypertension, rheumatoid arthritis, hyperlipidemia, presents to the hospital with abdominal pain, nausea and vomiting.  She had intermittent episodes of vomiting for the past 2 weeks.  For the past week, she has had 3 different episodes.  She describes significant pain in her left upper quadrant.  She is not had any fever.  Bowel movements have been normal.  She denies any shortness of breath.  She was referred to gastroenterology as an outpatient for further work-up.  When evaluated in the GI office today, she was noted to be increasingly weak and appears to be unwell.  She was tachycardic.  She was sent to the ER for evaluation.  On arrival to the ER, she was noted to be tachycardic and severely hypokalemic with a potassium of 2.7.  CT scan of the abdomen was completed that showed evidence of gastric volvulus with the majority of her stomach being in her chest.  General surgery consulted with recommendations to transfer to [facility] for definitive care.  Assessment & Plan:   Principal Problem:   Gastric volvulus Active Problems:   DM (diabetes mellitus) (HCC)   Hypokalemia   Hypertension goal BP (blood pressure) < 130/80   GERD   Hyperlipidemia LDL goal <100   Refractory nausea and vomiting   Rheumatoid arthritis ([place])   1. Gastric volvulus: Patient is still nauseous and having intermittent vomiting.  Abdominal pain is also intermittent.  Currently no pain but when it comes it comes 10 out of 10.  Daughter and husband at the bedside.  Notified Dr. Penelope Coop of [facility] about patient's arrival.  He has consulted general surgery and will see patient in consultation as well.  Management deferred to general surgery and GI.  2. Diabetes.  She was previously on Metformin, but this was  recently discontinued by her primary care physician.  Blood sugar fairly controlled.  Hemoglobin A1c 6.2.  Continue SSI.  3. Hypokalemia: 2.9.  Replace through IV.  Recheck in the morning.  Monitor.  4. Rheumatoid arthritis.  On chronic methotrexate.  No evidence of joint flares at this time.  5. Hyperlipidemia: Will resume statin once able to take p.o.  6. GERD.  Continue on PPI  7. Essential hypertension.  Holding oral agents.  Blood pressure slightly elevated this morning but much better now.  Continue IV as needed hydralazine.   DVT prophylaxis: SCDs Start: 01/13/20 2108   Code Status: Full Code  Family Communication: Daughter and husband present at bedside.  Plan of care discussed with patient in length and he verbalized understanding and agreed with it.  Status is: Inpatient  Remains inpatient appropriate because:Inpatient level of care appropriate due to severity of illness   Dispo: The patient is from: Home              Anticipated d/c is to: Home              Anticipated d/c date is: 2 days              Patient currently is not medically stable to d/c.        Estimated body mass index is 25.83 kg/m as calculated from the following:   Height as of this encounter: 4\' 11"  (1.499 m).   Weight as of this encounter:  58 kg.      Nutritional status:               Consultants:   GI and general surgery  Procedures:   None  Antimicrobials:  Anti-infectives (From admission, onward)   None         Subjective: Seen and examined.  Daughter and husband at bedside.  Complains of constant nausea and intermittent vomiting and intermittent abdominal pain but slightly better than yesterday.  Currently pain-free.  Objective: Vitals:   01/13/20 2100 01/14/20 0106 01/14/20 0438 01/14/20 0921  BP: (!) 181/102 (!) 161/91 (!) 157/86 129/82  Pulse: (!) 108 (!) 105 97 (!) 104  Resp: 18 18 18 18   Temp: 98.6 F (37 C) 98.3 F (36.8 C) 98.4 F (36.9 C) 98.7  F (37.1 C)  TempSrc: Oral Oral Oral Oral  SpO2: 94% 95% 98% 93%  Weight: 58 kg     Height: 4\' 11"  (1.499 m)       Intake/Output Summary (Last 24 hours) at 01/14/2020 1124 Last data filed at 01/14/2020 0600 Gross per 24 hour  Intake 1616.67 ml  Output 300 ml  Net 1316.67 ml   Filed Weights   01/13/20 0858 01/13/20 2100  Weight: 58 kg 58 kg    Examination:  General exam: Appears calm and comfortable  Respiratory system: Clear to auscultation. Respiratory effort normal. Cardiovascular system: S1 & S2 heard, RRR. No JVD, murmurs, rubs, gallops or clicks. No pedal edema. Gastrointestinal system: Abdomen is nondistended, soft and nontender. No organomegaly or masses felt. Normal bowel sounds heard. Central nervous system: Alert and oriented. No focal neurological deficits. Extremities: Symmetric 5 x 5 power. Skin: No rashes, lesions or ulcers Psychiatry: Judgement and insight appear normal. Mood & affect appropriate.    Data Reviewed: I have personally reviewed following labs and imaging studies  CBC: Recent Labs  Lab 01/13/20 0926 01/14/20 0445  WBC 10.5 12.4*  NEUTROABS 7.4  --   HGB 15.5* 14.3  HCT 46.2* 43.1  MCV 85.9 86.0  PLT 397 297   Basic Metabolic Panel: Recent Labs  Lab 01/13/20 0926 01/13/20 1339 01/14/20 0445  NA 140  --  139  K 2.7*  --  2.9*  CL 91*  --  100  CO2 30  --  24  GLUCOSE 166*  --  180*  BUN 14  --  9  CREATININE 0.82  --  0.55  CALCIUM 10.2  --  9.2  MG  --  1.8  --    GFR: Estimated Creatinine Clearance: 57.2 mL/min (by C-G formula based on SCr of 0.55 mg/dL). Liver Function Tests: Recent Labs  Lab 01/13/20 0926 01/14/20 0445  AST 20 18  ALT 22 20  ALKPHOS 76 66  BILITOT 0.9 0.7  PROT 9.0* 7.6  ALBUMIN 4.4 3.8   Recent Labs  Lab 01/13/20 0926  LIPASE 25   No results for input(s): AMMONIA in the last 168 hours. Coagulation Profile: Recent Labs  Lab 01/13/20 0926  INR 1.0   Cardiac Enzymes: No results for  input(s): CKTOTAL, CKMB, CKMBINDEX, TROPONINI in the last 168 hours. BNP (last 3 results) No results for input(s): PROBNP in the last 8760 hours. HbA1C: Recent Labs    01/13/20 2122  HGBA1C 6.2*   CBG: Recent Labs  Lab 01/13/20 2101 01/14/20 0011 01/14/20 0437 01/14/20 0721  GLUCAP 140* 152* 176* 145*   Lipid Profile: No results for input(s): CHOL, HDL, LDLCALC, TRIG, CHOLHDL, LDLDIRECT in the last 72  hours. Thyroid Function Tests: No results for input(s): TSH, T4TOTAL, FREET4, T3FREE, THYROIDAB in the last 72 hours. Anemia Panel: No results for input(s): VITAMINB12, FOLATE, FERRITIN, TIBC, IRON, RETICCTPCT in the last 72 hours. Sepsis Labs: No results for input(s): PROCALCITON, LATICACIDVEN in the last 168 hours.  Recent Results (from the past 240 hour(s))  Culture, Urine     Status: Abnormal   Collection Time: 01/12/20 10:18 AM   Specimen: Urine, Clean Catch  Result Value Ref Range Status   Specimen Description   Final    URINE, CLEAN CATCH Performed at [facility], [address] [place], [place] [address]    Special Requests   Final    NONE Performed at [facility], [address] [place], [place] [address]    Culture (A)  Final    <10,000 COLONIES/mL INSIGNIFICANT GROWTH Performed at [facility] [address] [place], [place] [address]    Report Status 01/13/2020 FINAL  Final  SARS Coronavirus 2 by RT PCR (hospital order, performed in [facility] hospital lab) Nasopharyngeal Nasopharyngeal Swab     Status: None   Collection Time: 01/13/20 12:09 PM   Specimen: Nasopharyngeal Swab  Result Value Ref Range Status   SARS Coronavirus 2 NEGATIVE NEGATIVE Final    Comment: (NOTE) SARS-CoV-2 target nucleic acids are NOT DETECTED.  The SARS-CoV-2 RNA is generally detectable in upper and lower respiratory specimens during the acute phase of infection. The lowest concentration of SARS-CoV-2 viral copies this assay can detect is 250 copies / mL. A negative  result does not preclude SARS-CoV-2 infection and should not be used as the sole basis for treatment or other patient management decisions.  A negative result may occur with improper specimen collection / handling, submission of specimen other than nasopharyngeal swab, presence of viral mutation(s) within the areas targeted by this assay, and inadequate number of viral copies (<250 copies / mL). A negative result must be combined with clinical observations, patient history, and epidemiological information.  Fact Sheet for Patients:   StrictlyIdeas.no  Fact Sheet for Healthcare Providers: BankingDealers.co.za  This test is not yet approved or  cleared by the [place] FDA and has been authorized for detection and/or diagnosis of SARS-CoV-2 by FDA under an Emergency Use Authorization (EUA).  This EUA will remain in effect (meaning this test can be used) for the duration of the COVID-19 declaration under Section 564(b)(1) of the Act, 21 U.S.C. section 360bbb-3(b)(1), unless the authorization is terminated or revoked sooner.  Performed at [facility], [address] [place], [place] [address]       Radiology Studies: CT Abdomen Pelvis W Contrast  Result Date: 01/13/2020 CLINICAL DATA:  Abdominal abscess or infection expected. Abdominal tenderness and nausea and vomiting. Tachycardia. EXAM: CT ABDOMEN AND PELVIS WITH CONTRAST TECHNIQUE: Multidetector CT imaging of the abdomen and pelvis was performed using the standard protocol following bolus administration of intravenous contrast. CONTRAST:  152mL OMNIPAQUE IOHEXOL 300 MG/ML  SOLN COMPARISON:  11/25/2013 FINDINGS: Lower chest: Lung bases are clear. Large gastric hernia hernia in the LEFT hemithorax described in the stomach section. Hepatobiliary: No focal hepatic lesion. No biliary duct dilatation. Gallbladder is normal. Common bile duct is normal. Pancreas: Pancreas is normal. No ductal  dilatation. No pancreatic inflammation. Spleen: Normal spleen Adrenals/urinary tract: Adrenal glands and kidneys are normal. The ureters and bladder normal. Stomach/Bowel: The GE junction extends below the diaphragm. The gastric body and antrum above the hemidiaphragm in the LEFT hemithorax. This herniated portion  of stomach is fluid-filled and distended. The proximal stomach is below the hemidiaphragm also fluid-filled and distended. 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 Duodenum and small bowel are normal. Small bowel is collapsed. Appendix and cecum normal. The colon is collapsed. Vascular/Lymphatic: Abdominal aorta is normal caliber. No periportal or retroperitoneal adenopathy. No pelvic adenopathy. Reproductive: Uterus and adnexa unremarkable. Other: No free fluid. Musculoskeletal: No aggressive osseous lesion. IMPRESSION: 1. Gastric body and antrum above the diaphragm while the GE junction and proximal stomach below the DG diaphragm. 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 Recommend general surgery consultation. 2. Stomach is fluid-filled and the small bowel and colon are decompressed consistent with GI obstruction. 3. Sliding-type hiatal hernia demonstrated on CT exam 6 years prior. Findings conveyed toELLIOTT WENTZ on 01/13/2020  at11:59. Electronically Signed   By: Suzy Bouchard M.D.   On: 01/13/2020 11:59    Scheduled Meds: . insulin aspart  0-9 Units Subcutaneous Q4H   Continuous Infusions: . 0.9 % NaCl with KCl 40 mEq / L    . potassium chloride       LOS: 1 day   Time spent: 39 minutes   Darliss Cheney, MD [facility]  01/14/2020, 11:24 AM   To contact the attending provider between 7A-7P or the covering provider during after hours 7P-7A, please log into the web site [email or web address].

## 2020-01-14 NOTE — Consult Note (Signed)
Jaime Murray 1956/04/02  [id number].    Requesting MD: Dr. Anson Fret Chief Complaint/Reason for Consult: gastric volvulus  HPI:  This is a very pleasant 64 yo black female with a history of a Schatzki's ring s/p endo dilatation, hiatal hernia, HTN, DM, and RA on methotrexate.  She gets a shot of this every Sunday.  Her last upper endoscopy by Dr. Gala Romney was several years ago at which time she had her dilatation and also noted to have a hiatal hernia.  She c/o a lot of reflux and has recently been having more issues with early satiety where she used to eat a whole potato and now can only eat a half.  Along with this symptoms, she also complains of trouble swallowing her potassium pills as she feels they get "stuck".  She has stopped taking these because of this.  She denies any issues with moving her bowels and just moved her bowels on Wednesday.  She will intermittently get abdominal pain with some nausea and vomiting every couple of months for about the past year.  She has not seen anyone for this.  This past weekend she started having some upper abdominal pain along with some N/V.  At first she thought this was just another episode like before, but as this continue to persist, she went to [facility] yesterday for evaluation.  Her labs were unremarkable, but she underwent a CT scan that revealed a gastric volvulus.  A request for transfer was placed to [facility].  She was finally transferred overnight or early this morning. She continues to have nausea and vomiting.  The daughter states her last several times of emesis have had some blood present.  GI saw her this morning as well and asked [place] to see the patient for further evaluation as well.  ROS: ROS: Please see HPI, otherwise all other systems have been reviewed and are negative except occasional swelling of her ankles, but she takes HCTZ which helps with this.  Family History  Problem Relation Age of Onset  . Hypertension Mother   . Colon  cancer Mother        unknown age of disease, still living, cancer-free   . Hypertension Father   . Stroke Father   . Heart disease Other   . Arthritis Other     Past Medical History:  Diagnosis Date  . Anxiety   . Constipation   . Depression   . DM (diabetes mellitus) ([place])   . Fatigue   . GERD (gastroesophageal reflux disease)   . HTN (hypertension)   . Hypercholesteremia   . IDA (iron deficiency anemia)     Past Surgical History:  Procedure Laterality Date  . COLONOSCOPY  01/01/10   Dr. Hansel Feinstein and sigmoid polyps, diminutive rectosigmoid polyp, tubular adenomas  . COLONOSCOPY N/A 12/06/2015   Procedure: COLONOSCOPY;  Surgeon: Daneil Dolin, MD;  Location: [facility] END[facility];  Service: Endoscopy;  Laterality: N/A;  730  . ESOPHAGOGASTRODUODENOSCOPY  12/14/09   Dr. Rourk:tight schatzki's ring s/p dilation/moderate size hiatal hernia  . GANGLION CYST EXCISION  05/22/2012   Procedure: REMOVAL GANGLION OF WRIST;  Surgeon: Carole Civil, MD;  Location: [facility] ORS;  Service: Orthopedics;  Laterality: Left;    Social History:  reports that she has been smoking cigarettes. She has a 15.00 pack-year smoking history. She has never used smokeless tobacco. She reports current alcohol use. She reports that she does not use drugs.  Allergies:  Allergies  Allergen Reactions  .  Gabapentin     palpitation    Facility-Administered Medications Prior to Admission  Medication Dose Route Frequency Provider Last Rate Last Admin  . Influenza (>/= 3 years) inactive virus vaccine (FLVIRIN/FLUZONE) injection SUSP 0.5 mL  0.5 mL Intramuscular Once Fayrene Helper, MD       Medications Prior to Admission  Medication Sig Dispense Refill  . acetaminophen (TYLENOL) 325 MG tablet Take 650 mg by mouth every 6 (six) hours as needed. For pain    . aspirin 81 MG tablet Take 81 mg by mouth daily.      . B-D TB SYRINGE 1CC/27GX1/2" 27G X 1/2" 1 ML MISC 1 each by Other route as directed.    . benazepril  (LOTENSIN) 5 MG tablet TAKE 1 TABLET DAILY 90 tablet 3  . cetirizine (ZYRTEC) 10 MG tablet TAKE 1 TABLET DAILY 90 tablet 3  . cloNIDine (CATAPRES) 0.1 MG tablet TAKE 1 TABLET AT BEDTIME 90 tablet 0  . folic acid (FOLVITE) 1 MG tablet Take 3 mg by mouth daily.    . hydrochlorothiazide (HYDRODIURIL) 25 MG tablet TAKE 1 TABLET DAILY 90 tablet 3  . lovastatin (MEVACOR) 40 MG tablet Take 1 tablet (40 mg total) by mouth at bedtime. 90 tablet 3  . Methotrexate Sodium (METHOTREXATE, PF,) 50 MG/2ML injection Inject 0.9 mLs into the muscle once a week. Every Sunday    . Omega 3 1000 MG CAPS Take 1 capsule by mouth daily.    Marland Kitchen omeprazole (PRILOSEC) 20 MG capsule TAKE 1 CAPSULE DAILY 90 capsule 3  . potassium chloride SA (KLOR-CON M20) 20 MEQ tablet Take 1 tablet (20 mEq total) by mouth 2 (two) times daily. 180 tablet 0  . Probiotic Product (PROBIOTIC DAILY) CAPS Take 1 capsule by mouth daily.    . sertraline (ZOLOFT) 50 MG tablet TAKE 1 TABLET DAILY ( DOSE INCREASE ) 90 tablet 3     Physical Exam: Blood pressure 129/82, pulse (!) 104, temperature 98.7 F (37.1 C), temperature source Oral, resp. rate 18, height 4\' 11"  (1.499 m), weight 58 kg, SpO2 93 %. General: pleasant, WD, WN black female who is laying in bed in NAD, but intermittently vomiting. HEENT: head is normocephalic, atraumatic.  Sclera are noninjected.  PERRL.  Ears and nose without any masses or lesions.  Mouth is pink and dry Heart: regular, rate, and rhythm.  Normal s1,s2. No obvious murmurs, gallops, or rubs noted.  Palpable radial and pedal pulses bilaterally Lungs: some wheezes bilaterally, no rhonchi, or rales noted.  Respiratory effort nonlabored Abd: soft, tender in epigastrium, but no guarding or rebounding.  No peritoneal signs, ND, +BS, no masses, hernias, or organomegaly MS: all 4 extremities are symmetrical with no cyanosis, clubbing, or edema. Skin: warm and dry with no masses, lesions, or rashes Neuro: Cranial nerves 2-12  grossly intact, sensation is normal throughout Psych: A&Ox3 with an appropriate affect.   Results for orders placed or performed during the hospital encounter of 01/13/20 (from the past 48 hour(s))  Comprehensive metabolic panel     Status: Abnormal   Collection Time: 01/13/20  9:26 AM  Result Value Ref Range   Sodium 140 135 - 145 mmol/L   Potassium 2.7 (LL) 3.5 - 5.1 mmol/L    Comment: CRITICAL RESULT CALLED TO, READ BACK BY AND VERIFIED WITH: KENDRICK,J AT 10:00AM ON 01/13/20 BY FESTERMAN,C    Chloride 91 (L) 98 - 111 mmol/L   CO2 30 22 - 32 mmol/L   Glucose, Bld 166 (H) 70 -  99 mg/dL    Comment: Glucose reference range applies only to samples taken after fasting for at least 8 hours.   BUN 14 8 - 23 mg/dL   Creatinine, Ser 0.82 0.44 - 1.00 mg/dL   Calcium 10.2 8.9 - 10.3 mg/dL   Total Protein 9.0 (H) 6.5 - 8.1 g/dL   Albumin 4.4 3.5 - 5.0 g/dL   AST 20 15 - 41 U/L   ALT 22 0 - 44 U/L   Alkaline Phosphatase 76 38 - 126 U/L   Total Bilirubin 0.9 0.3 - 1.2 mg/dL   GFR calc non Af Amer >60 >60 mL/min   GFR calc Af Amer >60 >60 mL/min   Anion gap 19 (H) 5 - 15    Comment: Performed at [facility], [address], [place], [place] [address]  Protime-INR     Status: None   Collection Time: 01/13/20  9:26 AM  Result Value Ref Range   Prothrombin Time 13.2 11.4 - 15.2 seconds   INR 1.0 0.8 - 1.2    Comment: (NOTE) INR goal varies based on device and disease states. Performed at [facility], [address], [place], [place] [address]   CBC with Differential     Status: Abnormal   Collection Time: 01/13/20  9:26 AM  Result Value Ref Range   WBC 10.5 4.0 - 10.5 K/uL   RBC 5.38 (H) 3.87 - 5.11 MIL/uL   Hemoglobin 15.5 (H) 12.0 - 15.0 g/dL   HCT 46.2 (H) 36 - 46 %   MCV 85.9 80.0 - 100.0 fL   MCH 28.8 26.0 - 34.0 pg   MCHC 33.5 30.0 - 36.0 g/dL   RDW 14.4 11.5 - 15.5 %   Platelets 397 150 - 400 K/uL   nRBC 0.0 0.0 - 0.2 %   Neutrophils Relative % 71 %   Neutro Abs 7.4 1.7 -  7.7 K/uL   Lymphocytes Relative 21 %   Lymphs Abs 2.2 0.7 - 4.0 K/uL   Monocytes Relative 7 %   Monocytes Absolute 0.7 0 - 1 K/uL   Eosinophils Relative 1 %   Eosinophils Absolute 0.1 0 - 0 K/uL   Basophils Relative 0 %   Basophils Absolute 0.0 0 - 0 K/uL   Immature Granulocytes 0 %   Abs Immature Granulocytes 0.04 0.00 - 0.07 K/uL    Comment: Performed at [facility], [address] [place], [place] [address]  Lipase, blood     Status: None   Collection Time: 01/13/20  9:26 AM  Result Value Ref Range   Lipase 25 11 - 51 U/L    Comment: Performed at [facility], [address] [place], [place] [address]  Ethanol     Status: None   Collection Time: 01/13/20  9:27 AM  Result Value Ref Range   Alcohol, Ethyl (B) <10 <10 mg/dL    Comment: (NOTE) Lowest detectable limit for serum alcohol is 10 mg/dL.  For medical purposes only. Performed at [facility], [address] [place],  [address]   Urinalysis, Routine w reflex microscopic     Status: Abnormal   Collection Time: 01/13/20 11:54 AM  Result Value Ref Range   Color, Urine YELLOW YELLOW   APPearance CLEAR CLEAR   Specific Gravity, Urine 1.036 (H) 1.005 - 1.030   pH 8.0 5.0 - 8.0   Glucose, UA NEGATIVE NEGATIVE mg/dL   Hgb urine dipstick NEGATIVE NEGATIVE   Bilirubin Urine NEGATIVE NEGATIVE   Ketones, ur 20 (  A) NEGATIVE mg/dL   Protein, ur 30 (A) NEGATIVE mg/dL   Nitrite NEGATIVE NEGATIVE   Leukocytes,Ua NEGATIVE NEGATIVE   RBC / HPF 0-5 0 - 5 RBC/hpf   WBC, UA 0-5 0 - 5 WBC/hpf   Bacteria, UA NONE SEEN NONE SEEN   Squamous Epithelial / LPF 0-5 0 - 5   Mucus PRESENT    Hyaline Casts, UA PRESENT     Comment: Performed at [facility], [address], [place], [place] [address]  Urine rapid drug screen (hosp performed)     Status: None   Collection Time: 01/13/20 11:56 AM  Result Value Ref Range   Opiates NONE DETECTED NONE DETECTED   Cocaine NONE DETECTED NONE DETECTED   Benzodiazepines NONE DETECTED NONE  DETECTED   Amphetamines NONE DETECTED NONE DETECTED   Tetrahydrocannabinol NONE DETECTED NONE DETECTED   Barbiturates NONE DETECTED NONE DETECTED    Comment: (NOTE) DRUG SCREEN FOR MEDICAL PURPOSES ONLY.  IF CONFIRMATION IS NEEDED FOR ANY PURPOSE, NOTIFY LAB WITHIN 5 DAYS.  LOWEST DETECTABLE LIMITS FOR URINE DRUG SCREEN Drug Class                     Cutoff (ng/mL) Amphetamine and metabolites    1000 Barbiturate and metabolites    200 Benzodiazepine                 767 Tricyclics and metabolites     300 Opiates and metabolites        300 Cocaine and metabolites        300 THC                            50 Performed at [facility], [address], [place], [place] [address]   SARS Coronavirus 2 by RT PCR (hospital order, performed in [facility] hospital lab) Nasopharyngeal Nasopharyngeal Swab     Status: None   Collection Time: 01/13/20 12:09 PM   Specimen: Nasopharyngeal Swab  Result Value Ref Range   SARS Coronavirus 2 NEGATIVE NEGATIVE    Comment: (NOTE) SARS-CoV-2 target nucleic acids are NOT DETECTED.  The SARS-CoV-2 RNA is generally detectable in upper and lower respiratory specimens during the acute phase of infection. The lowest concentration of SARS-CoV-2 viral copies this assay can detect is 250 copies / mL. A negative result does not preclude SARS-CoV-2 infection and should not be used as the sole basis for treatment or other patient management decisions.  A negative result may occur with improper specimen collection / handling, submission of specimen other than nasopharyngeal swab, presence of viral mutation(s) within the areas targeted by this assay, and inadequate number of viral copies (<250 copies / mL). A negative result must be combined with clinical observations, patient history, and epidemiological information.  Fact Sheet for Patients:   StrictlyIdeas.no  Fact Sheet for Healthcare  Providers: BankingDealers.co.za  This test is not yet approved or  cleared by the [place] FDA and has been authorized for detection and/or diagnosis of SARS-CoV-2 by FDA under an Emergency Use Authorization (EUA).  This EUA will remain in effect (meaning this test can be used) for the duration of the COVID-19 declaration under Section 564(b)(1) of the Act, 21 U.S.C. section 360bbb-3(b)(1), unless the authorization is terminated or revoked sooner.  Performed at [facility], [address] [place], [place] [address]   Magnesium     Status: None   Collection Time: 01/13/20  1:39  PM  Result Value Ref Range   Magnesium 1.8 1.7 - 2.4 mg/dL    Comment: Performed at [facility], [address] [place], [place] [address]  Glucose, capillary     Status: Abnormal   Collection Time: 01/13/20  9:01 PM  Result Value Ref Range   Glucose-Capillary 140 (H) 70 - 99 mg/dL    Comment: Glucose reference range applies only to samples taken after fasting for at least 8 hours.  Hemoglobin A1c     Status: Abnormal   Collection Time: 01/13/20  9:22 PM  Result Value Ref Range   Hgb A1c MFr Bld 6.2 (H) 4.8 - 5.6 %    Comment: (NOTE) Pre diabetes:          5.7%-6.4%  Diabetes:              >6.4%  Glycemic control for   <7.0% adults with diabetes    Mean Plasma Glucose 131.24 mg/dL    Comment: Performed at [facility] [address] [place], [place] [address]  HIV Antibody (routine testing w rflx)     Status: None   Collection Time: 01/13/20  9:22 PM  Result Value Ref Range   HIV Screen 4th Generation wRfx Non Reactive Non Reactive    Comment: Performed at [facility], [place] [address], [place], [place] [address]  Glucose, capillary     Status: Abnormal   Collection Time: 01/14/20 12:11 AM  Result Value Ref Range   Glucose-Capillary 152 (H) 70 - 99 mg/dL    Comment: Glucose reference range applies only to samples taken after fasting for at least 8 hours.   Glucose, capillary     Status: Abnormal   Collection Time: 01/14/20  4:37 AM  Result Value Ref Range   Glucose-Capillary 176 (H) 70 - 99 mg/dL    Comment: Glucose reference range applies only to samples taken after fasting for at least 8 hours.  Comprehensive metabolic panel     Status: Abnormal   Collection Time: 01/14/20  4:45 AM  Result Value Ref Range   Sodium 139 135 - 145 mmol/L   Potassium 2.9 (L) 3.5 - 5.1 mmol/L   Chloride 100 98 - 111 mmol/L   CO2 24 22 - 32 mmol/L   Glucose, Bld 180 (H) 70 - 99 mg/dL    Comment: Glucose reference range applies only to samples taken after fasting for at least 8 hours.   BUN 9 8 - 23 mg/dL   Creatinine, Ser 0.55 0.44 - 1.00 mg/dL   Calcium 9.2 8.9 - 10.3 mg/dL   Total Protein 7.6 6.5 - 8.1 g/dL   Albumin 3.8 3.5 - 5.0 g/dL   AST 18 15 - 41 U/L   ALT 20 0 - 44 U/L   Alkaline Phosphatase 66 38 - 126 U/L   Total Bilirubin 0.7 0.3 - 1.2 mg/dL   GFR calc non Af Amer >60 >60 mL/min   GFR calc Af Amer >60 >60 mL/min   Anion gap 15 5 - 15    Comment: Performed at [facility], [place] [address] [place] [address]  CBC     Status: Abnormal   Collection Time: 01/14/20  4:45 AM  Result Value Ref Range   WBC 12.4 (H) 4.0 - 10.5 K/uL   RBC 5.01 3.87 - 5.11 MIL/uL   Hemoglobin 14.3 12.0 - 15.0 g/dL   HCT 43.1 36 - 46 %   MCV 86.0 80.0 -  100.0 fL   MCH 28.5 26.0 - 34.0 pg   MCHC 33.2 30.0 - 36.0 g/dL   RDW 14.5 11.5 - 15.5 %   Platelets 342 150 - 400 K/uL   nRBC 0.0 0.0 - 0.2 %    Comment: Performed at [facility], [place] [address] [place], [place] [address]  Glucose, capillary     Status: Abnormal   Collection Time: 01/14/20  7:21 AM  Result Value Ref Range   Glucose-Capillary 145 (H) 70 - 99 mg/dL    Comment: Glucose reference range applies only to samples taken after fasting for at least 8 hours.  Glucose, capillary     Status: Abnormal   Collection Time: 01/14/20 11:25 AM  Result Value Ref Range   Glucose-Capillary  109 (H) 70 - 99 mg/dL    Comment: Glucose reference range applies only to samples taken after fasting for at least 8 hours.   CT Abdomen Pelvis W Contrast  Result Date: 01/13/2020 CLINICAL DATA:  Abdominal abscess or infection expected. Abdominal tenderness and nausea and vomiting. Tachycardia. EXAM: CT ABDOMEN AND PELVIS WITH CONTRAST TECHNIQUE: Multidetector CT imaging of the abdomen and pelvis was performed using the standard protocol following bolus administration of intravenous contrast. CONTRAST:  117mL OMNIPAQUE IOHEXOL 300 MG/ML  SOLN COMPARISON:  11/25/2013 FINDINGS: Lower chest: Lung bases are clear. Large gastric hernia hernia in the LEFT hemithorax described in the stomach section. Hepatobiliary: No focal hepatic lesion. No biliary duct dilatation. Gallbladder is normal. Common bile duct is normal. Pancreas: Pancreas is normal. No ductal dilatation. No pancreatic inflammation. Spleen: Normal spleen Adrenals/urinary tract: Adrenal glands and kidneys are normal. The ureters and bladder normal. Stomach/Bowel: The GE junction extends below the diaphragm. The gastric body and antrum above the hemidiaphragm in the LEFT hemithorax. This herniated portion of stomach is fluid-filled and distended. The proximal stomach is below the hemidiaphragm also fluid-filled and distended. 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 Duodenum and small bowel are normal. Small bowel is collapsed. Appendix and cecum normal. The colon is collapsed. Vascular/Lymphatic: Abdominal aorta is normal caliber. No periportal or retroperitoneal adenopathy. No pelvic adenopathy. Reproductive: Uterus and adnexa unremarkable. Other: No free fluid. Musculoskeletal: No aggressive osseous lesion. IMPRESSION: 1. Gastric body and antrum above the diaphragm while the GE junction and proximal stomach below the DG diaphragm. 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 Recommend general surgery consultation. 2.  Stomach is fluid-filled and the small bowel and colon are decompressed consistent with GI obstruction. 3. Sliding-type hiatal hernia demonstrated on CT exam 6 years prior. Findings conveyed toELLIOTT WENTZ on 01/13/2020  at11:59. Electronically Signed   By: Suzy Bouchard M.D.   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  She has been having emesis now for multiple days.  She has not had an NGT placed yet.  First line treatment is NGT decompression to try and resolve this issue.  If this fails, then endoscopic decompression and detorsion would be ideal.  Surgery, acutely, is not ideal as this would be difficult to fix.  Ideally, if the volvulus can be detorsed, she would need to follow up with a foregut surgeon to discuss hiatal hernia repair moving forward to correct her hernia but to also minimize her risk of further recurrent volvulus.  She does  have some abdominal pain currently, but does not have peritonitis or signs c/w gastric necrosis or ischemia that require emergent surgical intervention.  I have drawn a picture for the patient and her family who are present in the room with her explaining pathophysiology and treatment plans/goals.  They all understand and are agreeable with the current plan.  FEN - NPO/NGT VTE - ok for chemical prophylaxis from our standpoint ID - none currently needed   Henreitta Cea, [facility] Surgery 01/14/2020, 1:12 PM Please see Amion for pager number during day hours 7:00am-4:30pm or 7:00am -11:30am on weekends

## 2020-01-14 NOTE — Consult Note (Signed)
Subjective:   HPI The patient is a 64 year old female with a history of a hiatal hernia.  She went to [facility] emergency room yesterday morning with complaints of persistent vomiting over the past 4 days.  She has been having intermittent episodes of vomiting over the past month.  When she was in the emergency room she had a CT scan done which showed evidence of a gastric volvulus.  She was seen by GI and surgery there but they felt she needed to be transferred to this hospital.  She arrived at this hospital according to her about 830 or 9:00 last night.  She has continued to vomit.  She did not have any attempts at gastric decompression at [facility] emergency room.  She does not have an NG tube in.  She has a little abdominal pain but not that much.    Past Medical History:  Diagnosis Date  . Anxiety   . Constipation   . Depression   . DM (diabetes mellitus) ([place])   . Fatigue   . GERD (gastroesophageal reflux disease)   . HTN (hypertension)   . Hypercholesteremia   . IDA (iron deficiency anemia)    Past Surgical History:  Procedure Laterality Date  . COLONOSCOPY  01/01/10   Dr. Hansel Feinstein and sigmoid polyps, diminutive rectosigmoid polyp, tubular adenomas  . COLONOSCOPY N/A 12/06/2015   Procedure: COLONOSCOPY;  Surgeon: Daneil Dolin, MD;  Location: [facility] ENDO SUITE;  Service: Endoscopy;  Laterality: N/A;  730  . ESOPHAGOGASTRODUODENOSCOPY  12/14/09   Dr. Rourk:tight schatzki's ring s/p dilation/moderate size hiatal hernia  . GANGLION CYST EXCISION  05/22/2012   Procedure: REMOVAL GANGLION OF WRIST;  Surgeon: Carole Civil, MD;  Location: [facility] ORS;  Service: Orthopedics;  Laterality: Left;   Social History   Socioeconomic History  . Marital status: Married    Spouse name: Not on file  . Number of children: Not on file  . Years of education: 36  . Highest education level: Not on file  Occupational History    Employer: [employer]  Tobacco Use  .  Smoking status: Current Every Day Smoker    Packs/day: 0.25    Years: 30.00    Pack years: 7.50    Types: Cigarettes  . Smokeless tobacco: Never Used  Substance and Sexual Activity  . Alcohol use: Yes    Comment: 6 pack on Friday and Saturday, ocasional beers during the week   . Drug use: No  . Sexual activity: Yes    Birth control/protection: Post-menopausal  Other Topics Concern  . Not on file  Social History Narrative  . Not on file   Social Determinants of Health   Financial Resource Strain:   . Difficulty of Paying Living Expenses:   Food Insecurity:   . Worried About Charity fundraiser in the Last Year:   . Arboriculturist in the Last Year:   Transportation Needs:   . Film/video editor (Medical):   Marland Kitchen Lack of Transportation (Non-Medical):   Physical Activity:   . Days of Exercise per Week:   . Minutes of Exercise per Session:   Stress:   . Feeling of Stress :   Social Connections:   . Frequency of Communication with Friends and Family:   . Frequency of Social Gatherings with Friends and Family:   . Attends Religious Services:   . Active Member of Clubs or Organizations:   . Attends Archivist Meetings:   .  Marital Status:   Intimate Partner Violence:   . Fear of Current or Ex-Partner:   . Emotionally Abused:   Marland Kitchen Physically Abused:   . Sexually Abused:    family history includes Arthritis in an other family member; Colon cancer in her mother; Heart disease in an other family member; Hypertension in her father and mother; Stroke in her father.  Current Facility-Administered Medications:  .  0.9 % NaCl with KCl 40 mEq / L  infusion, , Intravenous, Continuous, Pahwani, Ravi, MD, Last Rate: 100 mL/hr at 01/14/20 1149, Rate Change at 01/14/20 1149 .  acetaminophen (TYLENOL) tablet 650 mg, 650 mg, Oral, Q6H PRN, 650 mg at 01/14/20 0807 **OR** acetaminophen (TYLENOL) suppository 650 mg, 650 mg, Rectal, Q6H PRN, Kathie Dike, MD .  hydrALAZINE  (APRESOLINE) injection 10 mg, 10 mg, Intravenous, Q6H PRN, Pahwani, Ravi, MD .  HYDROmorphone (DILAUDID) injection 0.5-1 mg, 0.5-1 mg, Intravenous, Q2H PRN, Kathie Dike, MD, 1 mg at 01/13/20 2150 .  insulin aspart (novoLOG) injection 0-9 Units, 0-9 Units, Subcutaneous, Q4H, Kathie Dike, MD, 1 Units at 01/14/20 0759 .  ondansetron (ZOFRAN) tablet 4 mg, 4 mg, Oral, Q6H PRN **OR** ondansetron (ZOFRAN) injection 4 mg, 4 mg, Intravenous, Q6H PRN, Kathie Dike, MD, 4 mg at 01/14/20 0758 .  potassium chloride 10 mEq in 100 mL IVPB, 10 mEq, Intravenous, Q1 Hr x 4, Pahwani, Ravi, MD, Last Rate: 100 mL/hr at 01/14/20 1154, 10 mEq at 01/14/20 1154 Allergies  Allergen Reactions  . Gabapentin     palpitation     Objective:     BP 129/82 (BP Location: Right Arm)   Pulse (!) 104   Temp 98.7 F (37.1 C) (Oral)   Resp 18   Ht 4\' 11"  (1.499 m)   Wt 58 kg   SpO2 93%   BMI 25.83 kg/m   She appears to be in some distress with vomiting but not a tremendous amount.  Nonicteric  Heart regular rhythm no murmurs  Lungs clear  Abdomen: Bowel sounds present, soft, nontender  CT scan reviewed showing gastric volvulus  Laboratory No components found for: D1    Assessment:     Gastric volvulus      Plan:     The first thing that needs to be done is gastric decompression.  I am ordering a NG tube to suction.  If NG tube is unsuccessful then we can consider endoscopic decompression.  She needs a surgical consult and I have call this in.  She will need definitive surgical treatment for the gastric volvulus.  Continue adequate hydration.  We will follow clinically.

## 2020-01-14 NOTE — Assessment & Plan Note (Signed)
Suprapubic pain and dark urine, CCUA in office negative for infection or blood , positive for ketones

## 2020-01-14 NOTE — Assessment & Plan Note (Signed)
Lung nodules in smoker needing follow up, lung scan to be ordered

## 2020-01-14 NOTE — Progress Notes (Signed)
   DARINDA STUTEVILLE     MRN: [id number]      DOB: 27-Oct-1955   HPI Ms. Bigler is here with her daughter reporting that for more than 6 months, she has noted poor appetite, early satiety with weight loss. In past 2 weeks , she has had several episodes of severe abdominal pain associated with nausea and vomiting. No report of  vomiting blood or coffee ground material, no report of bloody or black stool Denies fever or chills  C/o dark urine and bladder pain  ROS Denies recent fever or chills. Denies sinus pressure, nasal congestion, ear pain or sore throat. Denies chest congestion, productive cough or wheezing. Denies chest pains, palpitations and leg swelling  Denies joint pain, swelling and limitation in mobility. Denies headaches, seizures, numbness, or tingling. Denies depression, anxiety or insomnia. Denies skin break down or rash.   PE  BP 125/86   Pulse 93   Temp 98.6 F (37 C) (Temporal)   Resp 16   Ht 4\' 11"  (1.499 m)   Wt 133 lb 0.6 oz (60.3 kg)   SpO2 93%   BMI 26.87 kg/m   Patient alert and oriented and in no cardiopulmonary distress.  HEENT: No facial asymmetry, EOMI,     Neck supple .  Chest: Clear to auscultation bilaterally.  CVS: S1, S2 no murmurs, no S3.Regular rate.  ABD: Soft, diffuse superficial tenderness, no guarding or rebound, no palpable organomegaly or mass, BS normal  Ext: No edema  MS: Adequate ROM spine, shoulders, hips and knees.  Skin: Intact, no ulcerations or rash noted.  Psych: Good eye contact, normal affect. Memory intact not anxious or depressed appearing.  CNS: CN 2-12 intact, power,  normal throughout.no focal deficits noted.   Assessment & Plan  Abdominal pain in female Severe abdominal pain intermittent with vomiting, needs scan and urgent GI eval  Lung nodules Lung nodules in smoker needing follow up, lung scan to be ordered  Bladder pain Suprapubic pain and dark urine, CCUA in office negative for infection or  blood , positive for ketones  Weight loss, non-intentional Poor pppetite and weight loss noted over the past 8 to 10 months, needs GI eval, reports early satiety  NICOTINE ADDICTION Asked:confirms currently smokes cigarettes Assess: willing to quit and  cutting back Advise: needs to QUIT to reduce risk of cancer, cardio and cerebrovascular disease Assist: counseled for 5 minutes  Arrange: follow up in 3 months

## 2020-01-14 NOTE — Assessment & Plan Note (Addendum)
Poor pppetite and weight loss noted over the past 8 to 10 months, needs GI eval, reports early satiety

## 2020-01-14 NOTE — Assessment & Plan Note (Signed)
Severe abdominal pain intermittent with vomiting, needs scan and urgent GI eval

## 2020-01-15 ENCOUNTER — Inpatient Hospital Stay (HOSPITAL_COMMUNITY)

## 2020-01-15 LAB — CBC WITH DIFFERENTIAL/PLATELET
Abs Immature Granulocytes: 0.03 10*3/uL (ref 0.00–0.07)
Basophils Absolute: 0 10*3/uL (ref 0.0–0.1)
Basophils Relative: 0 %
Eosinophils Absolute: 0 10*3/uL (ref 0.0–0.5)
Eosinophils Relative: 0 %
HCT: 38.6 % (ref 36.0–46.0)
Hemoglobin: 12.6 g/dL (ref 12.0–15.0)
Immature Granulocytes: 0 %
Lymphocytes Relative: 22 %
Lymphs Abs: 1.9 10*3/uL (ref 0.7–4.0)
MCH: 29 pg (ref 26.0–34.0)
MCHC: 32.6 g/dL (ref 30.0–36.0)
MCV: 88.7 fL (ref 80.0–100.0)
Monocytes Absolute: 0.7 10*3/uL (ref 0.1–1.0)
Monocytes Relative: 8 %
Neutro Abs: 5.9 10*3/uL (ref 1.7–7.7)
Neutrophils Relative %: 70 %
Platelets: 266 10*3/uL (ref 150–400)
RBC: 4.35 MIL/uL (ref 3.87–5.11)
RDW: 14.6 % (ref 11.5–15.5)
WBC: 8.5 10*3/uL (ref 4.0–10.5)
nRBC: 0 % (ref 0.0–0.2)

## 2020-01-15 LAB — COMPREHENSIVE METABOLIC PANEL
ALT: 17 U/L (ref 0–44)
AST: 17 U/L (ref 15–41)
Albumin: 3.1 g/dL — ABNORMAL LOW (ref 3.5–5.0)
Alkaline Phosphatase: 48 U/L (ref 38–126)
Anion gap: 12 (ref 5–15)
BUN: 15 mg/dL (ref 8–23)
CO2: 26 mmol/L (ref 22–32)
Calcium: 9 mg/dL (ref 8.9–10.3)
Chloride: 107 mmol/L (ref 98–111)
Creatinine, Ser: 0.63 mg/dL (ref 0.44–1.00)
GFR calc Af Amer: >60 mL/min (ref >60)
GFR calc non Af Amer: >60 mL/min (ref >60)
Glucose, Bld: 103 mg/dL — ABNORMAL HIGH (ref 70–99)
Potassium: 3.9 mmol/L (ref 3.5–5.1)
Sodium: 145 mmol/L (ref 135–145)
Total Bilirubin: 0.7 mg/dL (ref 0.3–1.2)
Total Protein: 6.4 g/dL — ABNORMAL LOW (ref 6.5–8.1)

## 2020-01-15 LAB — GLUCOSE, CAPILLARY
Glucose-Capillary: 84 mg/dL (ref 70–99)
Glucose-Capillary: 90 mg/dL (ref 70–99)
Glucose-Capillary: 94 mg/dL (ref 70–99)
Glucose-Capillary: 87 mg/dL (ref 70–99)
Glucose-Capillary: 111 mg/dL — ABNORMAL HIGH (ref 70–99)

## 2020-01-15 LAB — MAGNESIUM: Magnesium: 1.7 mg/dL (ref 1.7–2.4)

## 2020-01-15 MED ORDER — ENOXAPARIN SODIUM 40 MG/0.4ML ~~LOC~~ SOLN
40.0000 mg | Freq: Every day | SUBCUTANEOUS | Status: DC
  Administered 2020-01-15 – 2020-01-17 (×3): 40 mg via SUBCUTANEOUS
  Filled 2020-01-15 (×3): qty 0.4

## 2020-01-15 MED ORDER — INSULIN ASPART 100 UNIT/ML ~~LOC~~ SOLN: 0.0000 [IU] | Freq: Three times a day (TID) | SUBCUTANEOUS | Status: DC

## 2020-01-15 MED ORDER — PANTOPRAZOLE SODIUM 40 MG IV SOLR
40.0000 mg | INTRAVENOUS | Status: DC
  Administered 2020-01-15 – 2020-01-16 (×2): 40 mg via INTRAVENOUS
  Filled 2020-01-15 (×2): qty 40

## 2020-01-15 NOTE — Progress Notes (Signed)
[facility] Progress Note     Subjective: Patient reports abdomen is sore but pain is greatly improved from previously. Some gagging and nausea from NGT. +flatus. Not distended.   Objective: Vital signs in last 24 hours: Temp:  [98.3 F (36.8 C)-99 F (37.2 C)] 98.9 F (37.2 C) (06/26 0943) Pulse Rate:  [86-108] 87 (06/26 0943) Resp:  [18-20] 18 (06/26 0943) BP: (120-159)/(69-85) 149/85 (06/26 0943) SpO2:  [93 %-98 %] 95 % (06/26 0943) Weight:  [56.9 kg] 56.9 kg (06/26 0412) Last BM Date: 01/12/20  Intake/Output from previous day: 06/25 0701 - 06/26 0700 In: 2130.6 [I.V.:1768.7; IV Piggyback:361.9] Out: 1350 [Urine:450; Emesis/NG output:900] Intake/Output this shift: No intake/output data recorded.  PE: General: pleasant, WD, WN female who is laying in bed in NAD HEENT:  Sclera are noninjected.  PERRL.  Ears and nose without any masses or lesions.  Mouth is pink and moist Heart: regular, rate, and rhythm.  Normal s1,s2. No obvious murmurs, gallops, or rubs noted.  Palpable radial and pedal pulses bilaterally Lungs: CTAB, no wheezes, rhonchi, or rales noted.  Respiratory effort nonlabored Abd: soft, NT, ND, +BS, no masses, hernias, or organomegaly MS: all 4 extremities are symmetrical with no cyanosis, clubbing, or edema. Skin: warm and dry with no masses, lesions, or rashes Neuro: Cranial nerves 2-12 grossly intact, sensation grossly intact throughout  Psych: A&Ox3 with an appropriate affect.   Lab Results:  Recent Labs    01/14/20 0445 01/15/20 0319  WBC 12.4* 8.5  HGB 14.3 12.6  HCT 43.1 38.6  PLT 342 266   BMET Recent Labs    01/14/20 0445 01/15/20 0319  NA 139 145  K 2.9* 3.9  CL 100 107  CO2 24 26  GLUCOSE 180* 103*  BUN 9 15  CREATININE 0.55 0.63  CALCIUM 9.2 9.0   PT/INR Recent Labs    01/13/20 0926  LABPROT 13.2  INR 1.0   CMP     Component Value Date/Time   NA 145 01/15/2020 0319   K 3.9 01/15/2020 0319   CL 107  01/15/2020 0319   CO2 26 01/15/2020 0319   GLUCOSE 103 (H) 01/15/2020 0319   BUN 15 01/15/2020 0319   CREATININE 0.63 01/15/2020 0319   CREATININE 0.55 09/30/2019 0807   CALCIUM 9.0 01/15/2020 0319   PROT 6.4 (L) 01/15/2020 0319   ALBUMIN 3.1 (L) 01/15/2020 0319   AST 17 01/15/2020 0319   ALT 17 01/15/2020 0319   ALKPHOS 48 01/15/2020 0319   BILITOT 0.7 01/15/2020 0319   GFRNONAA >60 01/15/2020 0319   GFRNONAA 101 09/30/2019 0807   GFRAA >60 01/15/2020 0319   GFRAA 117 09/30/2019 0807   Lipase     Component Value Date/Time   LIPASE 25 01/13/2020 0926       Studies/Results: CT Abdomen Pelvis W Contrast  Result Date: 01/13/2020 CLINICAL DATA:  Abdominal abscess or infection expected. Abdominal tenderness and nausea and vomiting. Tachycardia. EXAM: CT ABDOMEN AND PELVIS WITH CONTRAST TECHNIQUE: Multidetector CT imaging of the abdomen and pelvis was performed using the standard protocol following bolus administration of intravenous contrast. CONTRAST:  121mL OMNIPAQUE IOHEXOL 300 MG/ML  SOLN COMPARISON:  11/25/2013 FINDINGS: Lower chest: Lung bases are clear. Large gastric hernia hernia in the LEFT hemithorax described in the stomach section. Hepatobiliary: No focal hepatic lesion. No biliary duct dilatation. Gallbladder is normal. Common bile duct is normal. Pancreas: Pancreas is normal. No ductal dilatation. No pancreatic inflammation. Spleen: Normal spleen Adrenals/urinary tract: Adrenal glands and  kidneys are normal. The ureters and bladder normal. Stomach/Bowel: The GE junction extends below the diaphragm. The gastric body and antrum above the hemidiaphragm in the LEFT hemithorax. This herniated portion of stomach is fluid-filled and distended. The proximal stomach is below the hemidiaphragm also fluid-filled and distended. 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 Duodenum and small bowel are normal. Small bowel is collapsed. Appendix and cecum normal. The colon is  collapsed. Vascular/Lymphatic: Abdominal aorta is normal caliber. No periportal or retroperitoneal adenopathy. No pelvic adenopathy. Reproductive: Uterus and adnexa unremarkable. Other: No free fluid. Musculoskeletal: No aggressive osseous lesion. IMPRESSION: 1. Gastric body and antrum above the diaphragm while the GE junction and proximal stomach below the DG diaphragm. 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 Recommend general surgery consultation. 2. Stomach is fluid-filled and the small bowel and colon are decompressed consistent with GI obstruction. 3. Sliding-type hiatal hernia demonstrated on CT exam 6 years prior. Findings conveyed toELLIOTT WENTZ on 01/13/2020  at11:59. Electronically Signed   By: Suzy Bouchard M.D.   On: 01/13/2020 11:59   DG Abd Portable 1V  Result Date: 01/15/2020 CLINICAL DATA:  Evaluate NG tube EXAM: PORTABLE ABDOMEN - 1 VIEW COMPARISON:  None. FINDINGS: The NG tube terminates in the stomach. No gaseous distension identified. IMPRESSION: The NG tube terminates in the stomach without gaseous distension identified. Electronically Signed   By: Dorise Bullion III M.D   On: 01/15/2020 08:55   DG Abd Portable 1V  Result Date: 01/14/2020 CLINICAL DATA:  Check gastric catheter placement EXAM: PORTABLE ABDOMEN - 1 VIEW COMPARISON:  None. FINDINGS: Gastric catheter is noted coiled within the stomach. Scattered large and small bowel gas is noted. No other focal abnormality is seen. IMPRESSION: Gastric catheter coiled within the stomach. Electronically Signed   By: Inez Catalina M.D.   On: 01/14/2020 16:55    Anti-infectives: Anti-infectives (From admission, onward)   None       Assessment/Plan HTN DM (hgbA1C of 6.2) RA on methotrexate  Hiatal hernia GERD H/O Schatzki's ring, s/p esophageal dilatation  Gastric Volvulus - improved with NGT decompression  - no indication for acute surgical intervention at this time - will need surgical follow  up for elective hiatal hernia repair  - I think likely ok to try clamping and sips today if GI ok with this also  - ok to have ice chips with NGT for comfort  FEN - NPO/NGT VTE - ok for chemical prophylaxis from our standpoint ID - none currently needed  LOS: 2 days    Norm Parcel , [facility] 01/15/2020, 10:08 AM Please see Amion for pager number during day hours 7:00am-4:30pm

## 2020-01-15 NOTE — Plan of Care (Signed)
  Problem: Education: Goal: Knowledge of General Education information will improve Description Including pain rating scale, medication(s)/side effects and non-pharmacologic comfort measures Outcome: Progressing   

## 2020-01-15 NOTE — Progress Notes (Signed)
[facility] Gastroenterology Progress Note  Jaime Murray 64 y.o. [birth year]  CC: Gastric volvulus   Subjective: Patient seen and examined at bedside.  Feeling much better after NG decompression.  Feeling hungry and wants to eat something.  ROS : Afebrile.  Negative for chest pain.  Denies nausea and vomiting.   Objective: Vital signs in last 24 hours: Vitals:   01/15/20 0412 01/15/20 0943  BP: (!) 159/84 (!) 149/85  Pulse: 86 87  Resp: 20 18  Temp: 98.3 F (36.8 C) 98.9 F (37.2 C)  SpO2: 96% 95%    Physical Exam:  General:  Alert, cooperative, no distress, appears stated age, NG tube in place  Head:  Normocephalic, without obvious abnormality, atraumatic  Eyes:  , EOM's intact,   Lungs:    No visible respiratory distress noted  Heart:  Regular rate and rhythm, S1, S2 normal  Abdomen:   Soft, non-tender, nondistended, bowel sounds present, no peritoneal signs  Extremities: Extremities normal, atraumatic, no  edema       Lab Results: Recent Labs    01/13/20 0926 01/13/20 1339 01/14/20 0445 01/15/20 0319  NA   < >  --  139 145  K   < >  --  2.9* 3.9  CL   < >  --  100 107  CO2   < >  --  24 26  GLUCOSE   < >  --  180* 103*  BUN   < >  --  9 15  CREATININE   < >  --  0.55 0.63  CALCIUM   < >  --  9.2 9.0  MG  --  1.8  --  1.7   < > = values in this interval not displayed.   Recent Labs    01/14/20 0445 01/15/20 0319  AST 18 17  ALT 20 17  ALKPHOS 66 48  BILITOT 0.7 0.7  PROT 7.6 6.4*  ALBUMIN 3.8 3.1*   Recent Labs    01/13/20 0926 01/13/20 0926 01/14/20 0445 01/15/20 0319  WBC 10.5   < > 12.4* 8.5  NEUTROABS 7.4  --   --  5.9  HGB 15.5*   < > 14.3 12.6  HCT 46.2*   < > 43.1 38.6  MCV 85.9   < > 86.0 88.7  PLT 397   < > 342 266   < > = values in this interval not displayed.   Recent Labs    01/13/20 0926  LABPROT 13.2  INR 1.0      Assessment/Plan: -Gastric volvulus.  Symptoms improved significantly with NG suction/decompression.   Abdomen is soft.  Patient is feeling hungry.   Recommendations -------------------------- -Appreciate surgery input. -Okay to clamp NG tube and try clear liquid diet today.  Discussed with RN. -Start IV PPI -GI will follow   Otis Brace MD, [place] 01/15/2020, 12:56 PM  Contact #  [phone]

## 2020-01-15 NOTE — Progress Notes (Addendum)
PROGRESS NOTE    CORDA SHUTT  TMH:[id number] DOB: [birth year] DOA: 01/13/2020 PCP: Fayrene Helper, MD   Brief Narrative:  Jaime Murray is a 64 y.o. female with medical history significant of diabetes, hypertension, rheumatoid arthritis, hyperlipidemia, presents to the hospital with abdominal pain, nausea and vomiting.  She had intermittent episodes of vomiting for the past 2 weeks.  For the past week, she has had 3 different episodes.  She describes significant pain in her left upper quadrant.  She is not had any fever.  Bowel movements have been normal.  She denies any shortness of breath.  She was referred to gastroenterology as an outpatient for further work-up.  When evaluated in the GI office today, she was noted to be increasingly weak and appears to be unwell.  She was tachycardic.  She was sent to the ER for evaluation.  On arrival to the ER, she was noted to be tachycardic and severely hypokalemic with a potassium of 2.7.  CT scan of the abdomen was completed that showed evidence of gastric volvulus with the majority of her stomach being in her chest.  General surgery consulted with recommendations to transfer to [facility] for definitive care.  Assessment & Plan:   Principal Problem:   Gastric volvulus Active Problems:   DM (diabetes mellitus) (HCC)   Hypokalemia   Hypertension goal BP (blood pressure) < 130/80   GERD   Hyperlipidemia LDL goal <100   Refractory nausea and vomiting   Rheumatoid arthritis ([place])   1. Gastric volvulus: Feels much better since NG tube was placed.  Tells me that she is not feeling any pressure in the chest like she did yesterday.  Passing gas.  Significant improvement.  Further plans and management per GI and general surgery.  2. Diabetes.  She was previously on Metformin, but this was recently discontinued by her primary care physician.  Blood sugar fairly controlled.  Hemoglobin A1c 6.2.  Continue SSI.  3. Hypokalemia:  Resolved  4. Rheumatoid arthritis.  On chronic methotrexate.  No evidence of joint flares at this time.  5. Hyperlipidemia: Will resume statin once able to take p.o.  6. GERD.  Continue on PPI  7. Essential hypertension.  Holding oral agents.  Blood pressure slightly elevated this morning but much better now.  Continue IV as needed hydralazine.   DVT prophylaxis: SCDs Start: 01/13/20 2108, will start on Lovenox.   Code Status: Full Code  Family Communication: Daughter and husband present at bedside.  Plan of care discussed with patient in length and he verbalized understanding and agreed with it.  Status is: Inpatient  Remains inpatient appropriate because:Inpatient level of care appropriate due to severity of illness   Dispo: The patient is from: Home              Anticipated d/c is to: Home              Anticipated d/c date is: 1 to 2 days              Patient currently is not medically stable to d/c.        Estimated body mass index is 25.33 kg/m as calculated from the following:   Height as of this encounter: 4\' 11"  (1.499 m).   Weight as of this encounter: 56.9 kg.      Nutritional status:               Consultants:   GI and general surgery  Procedures:   None  Antimicrobials:  Anti-infectives (From admission, onward)   None         Subjective: Seen and examined.  Daughter and husband at the bedside.  Patient feels much better.  Passing gas.  No complaint.  Objective: Vitals:   01/14/20 1620 01/14/20 2029 01/15/20 0412 01/15/20 0943  BP: 120/76 129/69 (!) 159/84 (!) 149/85  Pulse: (!) 108 94 86 87  Resp: 18 20 20 18   Temp: 98.9 F (37.2 C) 99 F (37.2 C) 98.3 F (36.8 C) 98.9 F (37.2 C)  TempSrc: Oral Oral Oral Oral  SpO2: 93% 98% 96% 95%  Weight:   56.9 kg   Height:        Intake/Output Summary (Last 24 hours) at 01/15/2020 1017 Last data filed at 01/15/2020 0600 Gross per 24 hour  Intake 2130.62 ml  Output 1350 ml  Net  780.62 ml   Filed Weights   01/13/20 0858 01/13/20 2100 01/15/20 0412  Weight: 58 kg 58 kg 56.9 kg    Examination:  General exam: Appears calm and comfortable  Respiratory system: Clear to auscultation. Respiratory effort normal. Cardiovascular system: S1 & S2 heard, RRR. No JVD, murmurs, rubs, gallops or clicks. No pedal edema. Gastrointestinal system: Abdomen is nondistended, soft and nontender. No organomegaly or masses felt. Normal bowel sounds heard. Central nervous system: Alert and oriented. No focal neurological deficits. Extremities: Symmetric 5 x 5 power. Skin: No rashes, lesions or ulcers.  Psychiatry: Judgement and insight appear normal. Mood & affect appropriate.    Data Reviewed: I have personally reviewed following labs and imaging studies  CBC: Recent Labs  Lab 01/13/20 0926 01/14/20 0445 01/15/20 0319  WBC 10.5 12.4* 8.5  NEUTROABS 7.4  --  5.9  HGB 15.5* 14.3 12.6  HCT 46.2* 43.1 38.6  MCV 85.9 86.0 88.7  PLT 397 342 366   Basic Metabolic Panel: Recent Labs  Lab 01/13/20 0926 01/13/20 1339 01/14/20 0445 01/15/20 0319  NA 140  --  139 145  K 2.7*  --  2.9* 3.9  CL 91*  --  100 107  CO2 30  --  24 26  GLUCOSE 166*  --  180* 103*  BUN 14  --  9 15  CREATININE 0.82  --  0.55 0.63  CALCIUM 10.2  --  9.2 9.0  MG  --  1.8  --  1.7   GFR: Estimated Creatinine Clearance: 56.8 mL/min (by C-G formula based on SCr of 0.63 mg/dL). Liver Function Tests: Recent Labs  Lab 01/13/20 0926 01/14/20 0445 01/15/20 0319  AST 20 18 17   ALT 22 20 17   ALKPHOS 76 66 48  BILITOT 0.9 0.7 0.7  PROT 9.0* 7.6 6.4*  ALBUMIN 4.4 3.8 3.1*   Recent Labs  Lab 01/13/20 0926  LIPASE 25   No results for input(s): AMMONIA in the last 168 hours. Coagulation Profile: Recent Labs  Lab 01/13/20 0926  INR 1.0   Cardiac Enzymes: No results for input(s): CKTOTAL, CKMB, CKMBINDEX, TROPONINI in the last 168 hours. BNP (last 3 results) No results for input(s): PROBNP  in the last 8760 hours. HbA1C: Recent Labs    01/13/20 2122  HGBA1C 6.2*   CBG: Recent Labs  Lab 01/14/20 1616 01/14/20 2014 01/14/20 2358 01/15/20 0337 01/15/20 0813  GLUCAP 105* 107* 102* 84 90   Lipid Profile: No results for input(s): CHOL, HDL, LDLCALC, TRIG, CHOLHDL, LDLDIRECT in the last 72 hours. Thyroid Function Tests: No results for input(s): TSH, T4TOTAL,  FREET4, T3FREE, THYROIDAB in the last 72 hours. Anemia Panel: No results for input(s): VITAMINB12, FOLATE, FERRITIN, TIBC, IRON, RETICCTPCT in the last 72 hours. Sepsis Labs: No results for input(s): PROCALCITON, LATICACIDVEN in the last 168 hours.  Recent Results (from the past 240 hour(s))  Culture, Urine     Status: Abnormal   Collection Time: 01/12/20 10:18 AM   Specimen: Urine, Clean Catch  Result Value Ref Range Status   Specimen Description   Final    URINE, CLEAN CATCH Performed at [facility], [address], [place], [place] [address]    Special Requests   Final    NONE Performed at [facility], [address], [place], [place] [address]    Culture (A)  Final    <10,000 COLONIES/mL INSIGNIFICANT GROWTH Performed at [facility] [address], [place], [place] [address]    Report Status 01/13/2020 FINAL  Final  SARS Coronavirus 2 by RT PCR (hospital order, performed in [facility] hospital lab) Nasopharyngeal Nasopharyngeal Swab     Status: None   Collection Time: 01/13/20 12:09 PM   Specimen: Nasopharyngeal Swab  Result Value Ref Range Status   SARS Coronavirus 2 NEGATIVE NEGATIVE Final    Comment: (NOTE) SARS-CoV-2 target nucleic acids are NOT DETECTED.  The SARS-CoV-2 RNA is generally detectable in upper and lower respiratory specimens during the acute phase of infection. The lowest concentration of SARS-CoV-2 viral copies this assay can detect is 250 copies / mL. A negative result does not preclude SARS-CoV-2 infection and should not be used as the sole basis for treatment or  other patient management decisions.  A negative result may occur with improper specimen collection / handling, submission of specimen other than nasopharyngeal swab, presence of viral mutation(s) within the areas targeted by this assay, and inadequate number of viral copies (<250 copies / mL). A negative result must be combined with clinical observations, patient history, and epidemiological information.  Fact Sheet for Patients:   StrictlyIdeas.no  Fact Sheet for Healthcare Providers: BankingDealers.co.za  This test is not yet approved or  cleared by the [place] FDA and has been authorized for detection and/or diagnosis of SARS-CoV-2 by FDA under an Emergency Use Authorization (EUA).  This EUA will remain in effect (meaning this test can be used) for the duration of the COVID-19 declaration under Section 564(b)(1) of the Act, 21 U.S.C. section 360bbb-3(b)(1), unless the authorization is terminated or revoked sooner.  Performed at [facility], [address] [place],  [address]       Radiology Studies: CT Abdomen Pelvis W Contrast  Result Date: 01/13/2020 CLINICAL DATA:  Abdominal abscess or infection expected. Abdominal tenderness and nausea and vomiting. Tachycardia. EXAM: CT ABDOMEN AND PELVIS WITH CONTRAST TECHNIQUE: Multidetector CT imaging of the abdomen and pelvis was performed using the standard protocol following bolus administration of intravenous contrast. CONTRAST:  179mL OMNIPAQUE IOHEXOL 300 MG/ML  SOLN COMPARISON:  11/25/2013 FINDINGS: Lower chest: Lung bases are clear. Large gastric hernia hernia in the LEFT hemithorax described in the stomach section. Hepatobiliary: No focal hepatic lesion. No biliary duct dilatation. Gallbladder is normal. Common bile duct is normal. Pancreas: Pancreas is normal. No ductal dilatation. No pancreatic inflammation. Spleen: Normal spleen Adrenals/urinary tract: Adrenal glands and  kidneys are normal. The ureters and bladder normal. Stomach/Bowel: The GE junction extends below the diaphragm. The gastric body and antrum above the hemidiaphragm in the LEFT hemithorax. This herniated portion of stomach is fluid-filled and distended. The proximal stomach is  below the hemidiaphragm also fluid-filled and distended. 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 Duodenum and small bowel are normal. Small bowel is collapsed. Appendix and cecum normal. The colon is collapsed. Vascular/Lymphatic: Abdominal aorta is normal caliber. No periportal or retroperitoneal adenopathy. No pelvic adenopathy. Reproductive: Uterus and adnexa unremarkable. Other: No free fluid. Musculoskeletal: No aggressive osseous lesion. IMPRESSION: 1. Gastric body and antrum above the diaphragm while the GE junction and proximal stomach below the DG diaphragm. 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 Recommend general surgery consultation. 2. Stomach is fluid-filled and the small bowel and colon are decompressed consistent with GI obstruction. 3. Sliding-type hiatal hernia demonstrated on CT exam 6 years prior. Findings conveyed toELLIOTT WENTZ on 01/13/2020  at11:59. Electronically Signed   By: Suzy Bouchard M.D.   On: 01/13/2020 11:59   DG Abd Portable 1V  Result Date: 01/15/2020 CLINICAL DATA:  Evaluate NG tube EXAM: PORTABLE ABDOMEN - 1 VIEW COMPARISON:  None. FINDINGS: The NG tube terminates in the stomach. No gaseous distension identified. IMPRESSION: The NG tube terminates in the stomach without gaseous distension identified. Electronically Signed   By: Dorise Bullion III M.D   On: 01/15/2020 08:55   DG Abd Portable 1V  Result Date: 01/14/2020 CLINICAL DATA:  Check gastric catheter placement EXAM: PORTABLE ABDOMEN - 1 VIEW COMPARISON:  None. FINDINGS: Gastric catheter is noted coiled within the stomach. Scattered large and small bowel gas is noted. No other focal abnormality is  seen. IMPRESSION: Gastric catheter coiled within the stomach. Electronically Signed   By: Inez Catalina M.D.   On: 01/14/2020 16:55    Scheduled Meds: . insulin aspart  0-9 Units Subcutaneous Q4H   Continuous Infusions: . 0.9 % NaCl with KCl 40 mEq / L 100 mL/hr at 01/15/20 0624     LOS: 2 days   Time spent: 30 minutes   Darliss Cheney, MD [facility]  01/15/2020, 10:17 AM   To contact the attending provider between 7A-7P or the covering provider during after hours 7P-7A, please log into the web site [email or web address].

## 2020-01-16 LAB — COMPREHENSIVE METABOLIC PANEL
ALT: 15 U/L (ref 0–44)
AST: 17 U/L (ref 15–41)
Albumin: 2.8 g/dL — ABNORMAL LOW (ref 3.5–5.0)
Alkaline Phosphatase: 48 U/L (ref 38–126)
Anion gap: 9 (ref 5–15)
BUN: 8 mg/dL (ref 8–23)
CO2: 22 mmol/L (ref 22–32)
Calcium: 8.6 mg/dL — ABNORMAL LOW (ref 8.9–10.3)
Chloride: 106 mmol/L (ref 98–111)
Creatinine, Ser: 0.54 mg/dL (ref 0.44–1.00)
GFR calc Af Amer: >60 mL/min (ref >60)
GFR calc non Af Amer: >60 mL/min (ref >60)
Glucose, Bld: 125 mg/dL — ABNORMAL HIGH (ref 70–99)
Potassium: 3.7 mmol/L (ref 3.5–5.1)
Sodium: 137 mmol/L (ref 135–145)
Total Bilirubin: 0.6 mg/dL (ref 0.3–1.2)
Total Protein: 5.8 g/dL — ABNORMAL LOW (ref 6.5–8.1)

## 2020-01-16 LAB — CBC WITH DIFFERENTIAL/PLATELET
Abs Immature Granulocytes: 0.02 10*3/uL (ref 0.00–0.07)
Basophils Absolute: 0.1 10*3/uL (ref 0.0–0.1)
Basophils Relative: 1 %
Eosinophils Absolute: 0.1 10*3/uL (ref 0.0–0.5)
Eosinophils Relative: 1 %
HCT: 37.7 % (ref 36.0–46.0)
Hemoglobin: 12.2 g/dL (ref 12.0–15.0)
Immature Granulocytes: 0 %
Lymphocytes Relative: 28 %
Lymphs Abs: 2 10*3/uL (ref 0.7–4.0)
MCH: 28.9 pg (ref 26.0–34.0)
MCHC: 32.4 g/dL (ref 30.0–36.0)
MCV: 89.3 fL (ref 80.0–100.0)
Monocytes Absolute: 0.5 10*3/uL (ref 0.1–1.0)
Monocytes Relative: 7 %
Neutro Abs: 4.5 10*3/uL (ref 1.7–7.7)
Neutrophils Relative %: 63 %
Platelets: 214 10*3/uL (ref 150–400)
RBC: 4.22 MIL/uL (ref 3.87–5.11)
RDW: 14.3 % (ref 11.5–15.5)
WBC: 7.2 10*3/uL (ref 4.0–10.5)
nRBC: 0 % (ref 0.0–0.2)

## 2020-01-16 LAB — GLUCOSE, CAPILLARY
Glucose-Capillary: 100 mg/dL — ABNORMAL HIGH (ref 70–99)
Glucose-Capillary: 89 mg/dL (ref 70–99)
Glucose-Capillary: 86 mg/dL (ref 70–99)
Glucose-Capillary: 120 mg/dL — ABNORMAL HIGH (ref 70–99)

## 2020-01-16 MED ORDER — ASPIRIN EC 81 MG PO TBEC
81.0000 mg | DELAYED_RELEASE_TABLET | Freq: Every day | ORAL | Status: DC
  Administered 2020-01-16 – 2020-01-17 (×2): 81 mg via ORAL
  Filled 2020-01-16 (×2): qty 1

## 2020-01-16 MED ORDER — HYDROCHLOROTHIAZIDE 25 MG PO TABS
25.0000 mg | ORAL_TABLET | Freq: Every day | ORAL | Status: DC
  Administered 2020-01-16 – 2020-01-17 (×2): 25 mg via ORAL
  Filled 2020-01-16 (×2): qty 1

## 2020-01-16 MED ORDER — SERTRALINE HCL 50 MG PO TABS
50.0000 mg | ORAL_TABLET | Freq: Every day | ORAL | Status: DC
  Administered 2020-01-16 – 2020-01-17 (×2): 50 mg via ORAL
  Filled 2020-01-16 (×2): qty 1

## 2020-01-16 MED ORDER — FOLIC ACID 1 MG PO TABS
3.0000 mg | ORAL_TABLET | Freq: Every day | ORAL | Status: DC
  Administered 2020-01-16 – 2020-01-17 (×2): 3 mg via ORAL
  Filled 2020-01-16 (×2): qty 3

## 2020-01-16 MED ORDER — CLONIDINE HCL 0.1 MG PO TABS
0.1000 mg | ORAL_TABLET | Freq: Every day | ORAL | Status: DC
  Administered 2020-01-16: 0.1 mg via ORAL
  Filled 2020-01-16: qty 1

## 2020-01-16 MED ORDER — BENAZEPRIL HCL 5 MG PO TABS
5.0000 mg | ORAL_TABLET | Freq: Every day | ORAL | Status: DC
  Administered 2020-01-16 – 2020-01-17 (×2): 5 mg via ORAL
  Filled 2020-01-16 (×2): qty 1

## 2020-01-16 MED ORDER — PRAVASTATIN SODIUM 40 MG PO TABS
40.0000 mg | ORAL_TABLET | Freq: Every day | ORAL | Status: DC
  Administered 2020-01-16: 40 mg via ORAL
  Filled 2020-01-16: qty 1

## 2020-01-16 NOTE — Progress Notes (Signed)
PROGRESS NOTE    Jaime Murray  JJK:[id number] DOB: October 28, 1955 DOA: 01/13/2020 PCP: Fayrene Helper, MD   Brief Narrative:  Jaime Murray is a 64 y.o. female with medical history significant of diabetes, hypertension, rheumatoid arthritis, hyperlipidemia, presents to the hospital with abdominal pain, nausea and vomiting.  She had intermittent episodes of vomiting for the past 2 weeks.  For the past week, she has had 3 different episodes.  She describes significant pain in her left upper quadrant.  She is not had any fever.  Bowel movements have been normal.  She denies any shortness of breath.  She was referred to gastroenterology as an outpatient for further work-up.  When evaluated in the GI office today, she was noted to be increasingly weak and appears to be unwell.  She was tachycardic.  She was sent to the ER for evaluation.  On arrival to the ER, she was noted to be tachycardic and severely hypokalemic with a potassium of 2.7.  CT scan of the abdomen was completed that showed evidence of gastric volvulus with the majority of her stomach being in her chest.  General surgery consulted with recommendations to transfer to [facility] for definitive care.  After transfer to [facility], GI and general surgery were consulted here.  We initiated conservative management, NG tube was placed.  Patient improved significantly within 24 hours.  Patient's NG tube was clamped and then taken out.  Started on clears and advance diet.    Assessment & Plan:   Principal Problem:   Gastric volvulus Active Problems:   DM (diabetes mellitus) (HCC)   Hypokalemia   Hypertension goal BP (blood pressure) < 130/80   GERD   Hyperlipidemia LDL goal <100   Refractory nausea and vomiting   Rheumatoid arthritis ([place])   1. Gastric volvulus: Feels a lot better.  By the time I saw her, her NG tube was already out.  She was started on clears yesterday.  GI plans to advance diet as tolerated for rest of  the day today.  Continue PPI.  If continues to progress well then potential discharge tomorrow.  2. Diabetes.  She was previously on Metformin, but this was recently discontinued by her primary care physician.  Blood sugar fairly controlled.  Hemoglobin A1c 6.2.  Continue SSI.  3. Hypokalemia: Resolved  4. Rheumatoid arthritis.  On chronic methotrexate.  No evidence of joint flares at this time.  5. Hyperlipidemia: Will resume statin once able to take p.o.  6. GERD.  Continue on PPI  7. Essential hypertension.  Blood pressure slightly elevated.  Now that she is taking p.o., will resume all home medications.   DVT prophylaxis: enoxaparin (LOVENOX) injection 40 mg Start: 01/15/20 1030 SCDs Start: 01/13/20 2108,    Code Status: Full Code  Family Communication: Daughter present at bedside.  Plan of care discussed with patient in length and he verbalized understanding and agreed with it.  Status is: Inpatient  Remains inpatient appropriate because:Inpatient level of care appropriate due to severity of illness   Dispo: The patient is from: Home              Anticipated d/c is to: Home              Anticipated d/c date is: 1 day.              Patient currently is not medically stable to d/c.        Estimated body mass index is  25.33 kg/m as calculated from the following:   Height as of this encounter: 4\' 11"  (1.499 m).   Weight as of this encounter: 56.9 kg.      Nutritional status:               Consultants:   GI and general surgery  Procedures:   None  Antimicrobials:  Anti-infectives (From admission, onward)   None         Subjective: Patient seen and examined.  Daughter at the bedside.  Patient has no complaints.  Passing flatus but no bowel movement.  No nausea or any abdominal pain.  Objective: Vitals:   01/15/20 2050 01/16/20 0547 01/16/20 0551 01/16/20 0951  BP: (!) 160/89 (!) 159/95 (!) 155/80 (!) 158/85  Pulse: 81 75 79 68  Resp: 18  17 17 18   Temp: 99.1 F (37.3 C) 97.9 F (36.6 C) 98.7 F (37.1 C) 98.4 F (36.9 C)  TempSrc: Oral Oral Oral Oral  SpO2: 95% 100% 97% 98%  Weight:      Height:        Intake/Output Summary (Last 24 hours) at 01/16/2020 1248 Last data filed at 01/16/2020 0910 Gross per 24 hour  Intake 2001.73 ml  Output 1700 ml  Net 301.73 ml   Filed Weights   01/13/20 0858 01/13/20 2100 01/15/20 0412  Weight: 58 kg 58 kg 56.9 kg    Examination:  General exam: Appears calm and comfortable  Respiratory system: Clear to auscultation. Respiratory effort normal. Cardiovascular system: S1 & S2 heard, RRR. No JVD, murmurs, rubs, gallops or clicks. No pedal edema. Gastrointestinal system: Abdomen is nondistended, soft and nontender. No organomegaly or masses felt. Normal bowel sounds heard. Central nervous system: Alert and oriented. No focal neurological deficits. Extremities: Symmetric 5 x 5 power. Skin: No rashes, lesions or ulcers.  Psychiatry: Judgement and insight appear normal. Mood & affect appropriate.   Data Reviewed: I have personally reviewed following labs and imaging studies  CBC: Recent Labs  Lab 01/13/20 0926 01/14/20 0445 01/15/20 0319 01/16/20 0423  WBC 10.5 12.4* 8.5 7.2  NEUTROABS 7.4  --  5.9 4.5  HGB 15.5* 14.3 12.6 12.2  HCT 46.2* 43.1 38.6 37.7  MCV 85.9 86.0 88.7 89.3  PLT 397 342 266 654   Basic Metabolic Panel: Recent Labs  Lab 01/13/20 0926 01/13/20 1339 01/14/20 0445 01/15/20 0319 01/16/20 0423  NA 140  --  139 145 137  K 2.7*  --  2.9* 3.9 3.7  CL 91*  --  100 107 106  CO2 30  --  24 26 22   GLUCOSE 166*  --  180* 103* 125*  BUN 14  --  9 15 8   CREATININE 0.82  --  0.55 0.63 0.54  CALCIUM 10.2  --  9.2 9.0 8.6*  MG  --  1.8  --  1.7  --    GFR: Estimated Creatinine Clearance: 56.8 mL/min (by C-G formula based on SCr of 0.54 mg/dL). Liver Function Tests: Recent Labs  Lab 01/13/20 0926 01/14/20 0445 01/15/20 0319 01/16/20 0423  AST 20 18  17 17   ALT 22 20 17 15   ALKPHOS 76 66 48 48  BILITOT 0.9 0.7 0.7 0.6  PROT 9.0* 7.6 6.4* 5.8*  ALBUMIN 4.4 3.8 3.1* 2.8*   Recent Labs  Lab 01/13/20 0926  LIPASE 25   No results for input(s): AMMONIA in the last 168 hours. Coagulation Profile: Recent Labs  Lab 01/13/20 0926  INR 1.0   Cardiac  Enzymes: No results for input(s): CKTOTAL, CKMB, CKMBINDEX, TROPONINI in the last 168 hours. BNP (last 3 results) No results for input(s): PROBNP in the last 8760 hours. HbA1C: Recent Labs    01/13/20 2122  HGBA1C 6.2*   CBG: Recent Labs  Lab 01/15/20 1206 01/15/20 1652 01/15/20 2121 01/16/20 0721 01/16/20 1127  GLUCAP 94 87 111* 100* 89   Lipid Profile: No results for input(s): CHOL, HDL, LDLCALC, TRIG, CHOLHDL, LDLDIRECT in the last 72 hours. Thyroid Function Tests: No results for input(s): TSH, T4TOTAL, FREET4, T3FREE, THYROIDAB in the last 72 hours. Anemia Panel: No results for input(s): VITAMINB12, FOLATE, FERRITIN, TIBC, IRON, RETICCTPCT in the last 72 hours. Sepsis Labs: No results for input(s): PROCALCITON, LATICACIDVEN in the last 168 hours.  Recent Results (from the past 240 hour(s))  Culture, Urine     Status: Abnormal   Collection Time: 01/12/20 10:18 AM   Specimen: Urine, Clean Catch  Result Value Ref Range Status   Specimen Description   Final    URINE, CLEAN CATCH Performed at [facility], [address] [place], [place] [address]    Special Requests   Final    NONE Performed at [facility], [address], [place], [place] [address]    Culture (A)  Final    <10,000 COLONIES/mL INSIGNIFICANT GROWTH Performed at [facility] [address], [place], [place] [address]    Report Status 01/13/2020 FINAL  Final  SARS Coronavirus 2 by RT PCR (hospital order, performed in [facility] hospital lab) Nasopharyngeal Nasopharyngeal Swab     Status: None   Collection Time: 01/13/20 12:09 PM   Specimen: Nasopharyngeal Swab  Result Value Ref Range  Status   SARS Coronavirus 2 NEGATIVE NEGATIVE Final    Comment: (NOTE) SARS-CoV-2 target nucleic acids are NOT DETECTED.  The SARS-CoV-2 RNA is generally detectable in upper and lower respiratory specimens during the acute phase of infection. The lowest concentration of SARS-CoV-2 viral copies this assay can detect is 250 copies / mL. A negative result does not preclude SARS-CoV-2 infection and should not be used as the sole basis for treatment or other patient management decisions.  A negative result may occur with improper specimen collection / handling, submission of specimen other than nasopharyngeal swab, presence of viral mutation(s) within the areas targeted by this assay, and inadequate number of viral copies (<250 copies / mL). A negative result must be combined with clinical observations, patient history, and epidemiological information.  Fact Sheet for Patients:   StrictlyIdeas.no  Fact Sheet for Healthcare Providers: BankingDealers.co.za  This test is not yet approved or  cleared by the [place] FDA and has been authorized for detection and/or diagnosis of SARS-CoV-2 by FDA under an Emergency Use Authorization (EUA).  This EUA will remain in effect (meaning this test can be used) for the duration of the COVID-19 declaration under Section 564(b)(1) of the Act, 21 U.S.C. section 360bbb-3(b)(1), unless the authorization is terminated or revoked sooner.  Performed at [facility], [address], [place],  [address]       Radiology Studies: DG Abd Portable 1V  Result Date: 01/15/2020 CLINICAL DATA:  Evaluate NG tube EXAM: PORTABLE ABDOMEN - 1 VIEW COMPARISON:  None. FINDINGS: The NG tube terminates in the stomach. No gaseous distension identified. IMPRESSION: The NG tube terminates in the stomach without gaseous distension identified. Electronically Signed   By: Dorise Bullion III M.D   On: 01/15/2020 08:55    DG Abd Portable 1V  Result Date: 01/14/2020 CLINICAL DATA:  Check gastric catheter placement EXAM: PORTABLE ABDOMEN - 1 VIEW COMPARISON:  None. FINDINGS: Gastric catheter is noted coiled within the stomach. Scattered large and small bowel gas is noted. No other focal abnormality is seen. IMPRESSION: Gastric catheter coiled within the stomach. Electronically Signed   By: Inez Catalina M.D.   On: 01/14/2020 16:55    Scheduled Meds: . enoxaparin (LOVENOX) injection  40 mg Subcutaneous Daily  . insulin aspart  0-9 Units Subcutaneous TID AC & HS  . pantoprazole (PROTONIX) IV  40 mg Intravenous Q24H   Continuous Infusions: . 0.9 % NaCl with KCl 40 mEq / L 100 mL/hr at 01/15/20 1648     LOS: 3 days   Time spent: 28 minutes   Darliss Cheney, MD [facility]  01/16/2020, 12:48 PM   To contact the attending provider between 7A-7P or the covering provider during after hours 7P-7A, please log into the web site [email or web address].

## 2020-01-16 NOTE — Plan of Care (Signed)
  Problem: Activity: Goal: Risk for activity intolerance will decrease Outcome: Progressing   

## 2020-01-16 NOTE — Discharge Instructions (Signed)
Hiatal Hernia  A hiatal hernia occurs when part of the stomach slides above the muscle that separates the abdomen from the chest (diaphragm). A person can be born with a hiatal hernia (congenital), or it may develop over time. In almost all cases of hiatal hernia, only the top part of the stomach pushes through the diaphragm. Many people have a hiatal hernia with no symptoms. The larger the hernia, the more likely it is that you will have symptoms. In some cases, a hiatal hernia allows stomach acid to flow back into the tube that carries food from your mouth to your stomach (esophagus). This may cause heartburn symptoms. Severe heartburn symptoms may mean that you have developed a condition called gastroesophageal reflux disease (GERD). What are the causes? This condition is caused by a weakness in the opening (hiatus) where the esophagus passes through the diaphragm to attach to the upper part of the stomach. A person may be born with a weakness in the hiatus, or a weakness can develop over time. What increases the risk? This condition is more likely to develop in:  Older people. Age is a major risk factor for a hiatal hernia, especially if you are over the age of 50.  Pregnant women.  People who are overweight.  People who have frequent constipation. What are the signs or symptoms? Symptoms of this condition usually develop in the form of GERD symptoms. Symptoms include:  Heartburn.  Belching.  Indigestion.  Trouble swallowing.  Coughing or wheezing.  Sore throat.  Hoarseness.  Chest pain.  Nausea and vomiting. How is this diagnosed? This condition may be diagnosed during testing for GERD. Tests that may be done include:  X-rays of your stomach or chest.  An upper gastrointestinal (GI) series. This is an X-ray exam of your GI tract that is taken after you swallow a chalky liquid that shows up clearly on the X-ray.  Endoscopy. This is a procedure to look into your stomach  using a thin, flexible tube that has a tiny camera and light on the end of it. How is this treated? This condition may be treated by:  Dietary and lifestyle changes to help reduce GERD symptoms.  Medicines. These may include: ? Over-the-counter antacids. ? Medicines that make your stomach empty more quickly. ? Medicines that block the production of stomach acid (H2 blockers). ? Stronger medicines to reduce stomach acid (proton pump inhibitors).  Surgery to repair the hernia, if other treatments are not helping. If you have no symptoms, you may not need treatment. Follow these instructions at home: Lifestyle and activity  Do not use any products that contain nicotine or tobacco, such as cigarettes and e-cigarettes. If you need help quitting, ask your health care provider.  Try to achieve and maintain a healthy body weight.  Avoid putting pressure on your abdomen. Anything that puts pressure on your abdomen increases the amount of acid that may be pushed up into your esophagus. ? Avoid bending over, especially after eating. ? Raise the head of your bed by putting blocks under the legs. This keeps your head and esophagus higher than your stomach. ? Do not wear tight clothing around your chest or stomach. ? Try not to strain when having a bowel movement, when urinating, or when lifting heavy objects. Eating and drinking  Avoid foods that can worsen GERD symptoms. These may include: ? Fatty foods, like fried foods. ? Citrus fruits, like oranges or lemon. ? Other foods and drinks that contain acid, like   orange juice or tomatoes. ? Spicy food. ? Chocolate.  Eat frequent small meals instead of three large meals a day. This helps prevent your stomach from getting too full. ? Eat slowly. ? Do not lie down right after eating. ? Do not eat 1-2 hours before bed.  Do not drink beverages with caffeine. These include cola, coffee, cocoa, and tea.  Do not drink alcohol. General  instructions  Take over-the-counter and prescription medicines only as told by your health care provider.  Keep all follow-up visits as told by your health care provider. This is important. Contact a health care provider if:  Your symptoms are not controlled with medicines or lifestyle changes.  You are having trouble swallowing.  You have coughing or wheezing that will not go away. Get help right away if:  Your pain is getting worse.  Your pain spreads to your arms, neck, jaw, teeth, or back.  You have shortness of breath.  You sweat for no reason.  You feel sick to your stomach (nauseous) or you vomit.  You vomit blood.  You have bright red blood in your stools.  You have black, tarry stools. This information is not intended to replace advice given to you by your health care provider. Make sure you discuss any questions you have with your health care provider. Document Revised: 06/20/2017 Document Reviewed: 02/10/2017 Elsevier Patient Education  2020 [employer]  

## 2020-01-16 NOTE — Progress Notes (Signed)
[facility] Progress Note     Subjective: Patient denies abdominal pain or nausea. +flatus. Tolerating liquids. Discussed surgical follow up for eventual hiatal hernia repair.   Objective: Vital signs in last 24 hours: Temp:  [97.9 F (36.6 C)-99.1 F (37.3 C)] 98.7 F (37.1 C) (06/27 0551) Pulse Rate:  [75-83] 79 (06/27 0551) Resp:  [17-18] 17 (06/27 0551) BP: (145-160)/(80-97) 155/80 (06/27 0551) SpO2:  [95 %-100 %] 97 % (06/27 0551) Last BM Date: 01/12/20  Intake/Output from previous day: 06/26 0701 - 06/27 0700 In: 2117.9 [P.O.:120; I.V.:1997.9] Out: 1100 [Urine:900; Emesis/NG output:200] Intake/Output this shift: Total I/O In: -  Out: 800 [Urine:800]  PE: General: pleasant, WD, WN female who is laying in bed in NAD HEENT:  Sclera are noninjected.  PERRL.  Ears and nose without any masses or lesions.  Mouth is pink and moist Heart: regular, rate, and rhythm.  Normal s1,s2. No obvious murmurs, gallops, or rubs noted.  Palpable radial and pedal pulses bilaterally Lungs: CTAB, no wheezes, rhonchi, or rales noted.  Respiratory effort nonlabored Abd: soft, NT, ND, +BS, no masses, hernias, or organomegaly MS: all 4 extremities are symmetrical with no cyanosis, clubbing, or edema. Skin: warm and dry with no masses, lesions, or rashes Neuro: Cranial nerves 2-12 grossly intact, sensation grossly intact throughout  Psych: A&Ox3 with an appropriate affect.   Lab Results:  Recent Labs    01/15/20 0319 01/16/20 0423  WBC 8.5 7.2  HGB 12.6 12.2  HCT 38.6 37.7  PLT 266 214   BMET Recent Labs    01/15/20 0319 01/16/20 0423  NA 145 137  K 3.9 3.7  CL 107 106  CO2 26 22  GLUCOSE 103* 125*  BUN 15 8  CREATININE 0.63 0.54  CALCIUM 9.0 8.6*   PT/INR No results for input(s): LABPROT, INR in the last 72 hours. CMP     Component Value Date/Time   NA 137 01/16/2020 0423   K 3.7 01/16/2020 0423   CL 106 01/16/2020 0423   CO2 22 01/16/2020 0423   GLUCOSE  125 (H) 01/16/2020 0423   BUN 8 01/16/2020 0423   CREATININE 0.54 01/16/2020 0423   CREATININE 0.55 09/30/2019 0807   CALCIUM 8.6 (L) 01/16/2020 0423   PROT 5.8 (L) 01/16/2020 0423   ALBUMIN 2.8 (L) 01/16/2020 0423   AST 17 01/16/2020 0423   ALT 15 01/16/2020 0423   ALKPHOS 48 01/16/2020 0423   BILITOT 0.6 01/16/2020 0423   GFRNONAA >60 01/16/2020 0423   GFRNONAA 101 09/30/2019 0807   GFRAA >60 01/16/2020 0423   GFRAA 117 09/30/2019 0807   Lipase     Component Value Date/Time   LIPASE 25 01/13/2020 0926       Studies/Results: DG Abd Portable 1V  Result Date: 01/15/2020 CLINICAL DATA:  Evaluate NG tube EXAM: PORTABLE ABDOMEN - 1 VIEW COMPARISON:  None. FINDINGS: The NG tube terminates in the stomach. No gaseous distension identified. IMPRESSION: The NG tube terminates in the stomach without gaseous distension identified. Electronically Signed   By: Dorise Bullion III M.D   On: 01/15/2020 08:55   DG Abd Portable 1V  Result Date: 01/14/2020 CLINICAL DATA:  Check gastric catheter placement EXAM: PORTABLE ABDOMEN - 1 VIEW COMPARISON:  None. FINDINGS: Gastric catheter is noted coiled within the stomach. Scattered large and small bowel gas is noted. No other focal abnormality is seen. IMPRESSION: Gastric catheter coiled within the stomach. Electronically Signed   By: Inez Catalina M.D.   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 We will sign off at this time, will put follow up  in chart   FEN -FLD VTE -lovenox ID -none currently needed  LOS: 3 days    Norm Parcel , [facility] 01/16/2020, 11:45 AM Please see Amion for pager number during day hours 7:00am-4:30pm

## 2020-01-16 NOTE — Progress Notes (Signed)
[facility] Progress Note  KAMBRIA GRIMA 64 y.o. [birth year]  CC: Gastric volvulus   Subjective: Patient seen and examined at bedside.  Patient feeling much better.  Tolerating liquid diet.  Family at bedside.  ROS : Afebrile.  Negative for chest pain.  Denies nausea and vomiting.   Objective: Vital signs in last 24 hours: Vitals:   01/16/20 0547 01/16/20 0551  BP: (!) 159/95 (!) 155/80  Pulse: 75 79  Resp: 17 17  Temp: 97.9 F (36.6 C) 98.7 F (37.1 C)  SpO2: 100% 97%    Physical Exam:  General:  Alert, cooperative, no distress, appears stated age, NG tube in place  Head:  Normocephalic, without obvious abnormality, atraumatic  Eyes:  , EOM's intact,   Lungs:    No visible respiratory distress noted  Heart:  Regular rate and rhythm, S1, S2 normal  Abdomen:   Soft, non-tender, nondistended, bowel sounds present, no peritoneal signs  Extremities: Extremities normal, atraumatic, no  edema       Lab Results: Recent Labs    01/13/20 1339 01/14/20 0445 01/15/20 0319 01/16/20 0423  NA  --    < > 145 137  K  --    < > 3.9 3.7  CL  --    < > 107 106  CO2  --    < > 26 22  GLUCOSE  --    < > 103* 125*  BUN  --    < > 15 8  CREATININE  --    < > 0.63 0.54  CALCIUM  --    < > 9.0 8.6*  MG 1.8  --  1.7  --    < > = values in this interval not displayed.   Recent Labs    01/15/20 0319 01/16/20 0423  AST 17 17  ALT 17 15  ALKPHOS 48 48  BILITOT 0.7 0.6  PROT 6.4* 5.8*  ALBUMIN 3.1* 2.8*   Recent Labs    01/15/20 0319 01/16/20 0423  WBC 8.5 7.2  NEUTROABS 5.9 4.5  HGB 12.6 12.2  HCT 38.6 37.7  MCV 88.7 89.3  PLT 266 214   No results for input(s): LABPROT, INR in the last 72 hours.    Assessment/Plan: -Gastric volvulus.  Symptoms improved significantly with NG suction/decompression.  Abdomen is soft.  Patient is feeling hungry. -Constipation.  No bowel movement for last 5 days.  Recommendations -------------------------- -DC NG  tube. -Advance diet to full liquid.  If tolerating diet, consider advancing to soft diet in the evening. -Continue IV Protonix 40 mg while in the hospital.  Recommend pantoprazole 40 mg oral once a day after discharge. -Start MiraLAX for constipation. -Follow-up with primary GI in [place] in few weeks after discharge. -No further inpatient GI work-up planned.  GI will sign off.  Call us back if needed  Otis Brace MD, [place] 01/16/2020, 9:35 AM  Contact #  [phone]

## 2020-01-17 LAB — CBC WITH DIFFERENTIAL/PLATELET
Abs Immature Granulocytes: 0.02 10*3/uL (ref 0.00–0.07)
Basophils Absolute: 0.1 10*3/uL (ref 0.0–0.1)
Basophils Relative: 1 %
Eosinophils Absolute: 0.1 10*3/uL (ref 0.0–0.5)
Eosinophils Relative: 2 %
HCT: 37.2 % (ref 36.0–46.0)
Hemoglobin: 12.3 g/dL (ref 12.0–15.0)
Immature Granulocytes: 0 %
Lymphocytes Relative: 26 %
Lymphs Abs: 1.9 10*3/uL (ref 0.7–4.0)
MCH: 29.1 pg (ref 26.0–34.0)
MCHC: 33.1 g/dL (ref 30.0–36.0)
MCV: 87.9 fL (ref 80.0–100.0)
Monocytes Absolute: 0.6 10*3/uL (ref 0.1–1.0)
Monocytes Relative: 8 %
Neutro Abs: 4.5 10*3/uL (ref 1.7–7.7)
Neutrophils Relative %: 63 %
Platelets: 226 10*3/uL (ref 150–400)
RBC: 4.23 MIL/uL (ref 3.87–5.11)
RDW: 13.7 % (ref 11.5–15.5)
WBC: 7.2 10*3/uL (ref 4.0–10.5)
nRBC: 0 % (ref 0.0–0.2)

## 2020-01-17 LAB — GLUCOSE, CAPILLARY: Glucose-Capillary: 105 mg/dL — ABNORMAL HIGH (ref 70–99)

## 2020-01-17 MED ORDER — OMEPRAZOLE 40 MG PO CPDR: 40.0000 mg | DELAYED_RELEASE_CAPSULE | Freq: Every day | ORAL | 0 refills | Status: DC

## 2020-01-17 NOTE — Progress Notes (Signed)
DISCHARGE NOTE HOME Jaime Murray to be discharged Home per MD order. Discussed prescriptions and follow up appointments with the patient. Prescriptions given to patient; medication list explained in detail. Patient verbalized understanding.  Skin clean, dry and intact without evidence of skin break down, no evidence of skin tears noted. IV catheter discontinued intact. Site without signs and symptoms of complications. Dressing and pressure applied. Pt denies pain at the site currently. No complaints noted.  Patient free of lines, drains, and wounds.   An After Visit Summary (AVS) was printed and given to the patient. Patient escorted via wheelchair, and discharged home via private auto.  Aneta Mins BSN, RN3

## 2020-01-17 NOTE — Discharge Summary (Signed)
Physician Discharge Summary  Jaime Murray JIR:[id number] DOB: 1956-03-14 DOA: 01/13/2020  PCP: Fayrene Helper, MD  Admit date: 01/13/2020 Discharge date: 01/17/2020  Admitted From: Home Disposition: Home  Recommendations for Outpatient Follow-up:  1. Follow up with PCP in 1-2 weeks 2. Follow with general surgery to schedule repair of the hiatal hernia.  Their office will call you.  If they do not call you within a week, please call them. 3. Please obtain BMP/CBC in one week 4. Please follow up with your PCP on the following pending results: Unresulted Labs (From admission, onward) Comment          Start     Ordered   01/15/20 0500  CBC with Differential/Platelet  Daily,   R      01/14/20 0824           Home Health: None Equipment/Devices: None  Discharge Condition: Stable CODE STATUS: Full code Diet recommendation: Soft diet  Subjective: Seen and examined.  Husband at bedside.  No complaints.  Tolerating diet.  Brief/Interim Summary: Jaime Murray a 64 y.o.femalewith medical history significant ofdiabetes, hypertension, rheumatoid arthritis, hyperlipidemia, presented to [facility] with abdominal pain, nausea and vomiting. She had intermittent episodes of vomiting for the past 2 weeks. For the past week, she has had 3 different episodes. She describes significant pain in her left upper quadrant. She is not had any fever. Bowel movements have been normal. She denies any shortness of breath. She was referred to gastroenterology as an outpatient for further work-up. When evaluated in the GI office, she was noted to be increasingly weak and appears to be unwell. She was tachycardic. She was sent to the ER for evaluation. On arrival to the ER, she was noted to be tachycardic and severely hypokalemic with a potassium of 2.7. CT scan of the abdomen was completed that showed evidence of gastric volvulus with the majority of her stomach being in her chest. General  surgery consulted with recommendations to transfer to [facility] for definitive care.  After transfer to [facility], GI and general surgery were consulted here.  We initiated conservative management, NG tube was placed.  Patient improved significantly within 24 hours.  Patient's NG tube was clamped and then taken out.  Started on clears and advance diet.    She is tolerating soft diet.  General surgery has recommended to discharge patient on soft diet.  GI has recommended to increase her omeprazole from 20 mg daily to 40 mg p.o. daily.  Patient is doing well.  She is hemodynamically stable so she is going to be discharged.  She will follow with general surgery to schedule repair of the hiatal hernia.  Discharge Diagnoses:  Principal Problem:   Gastric volvulus Active Problems:   DM (diabetes mellitus) ([place])   Hypokalemia   Hypertension goal BP (blood pressure) < 130/80   GERD   Hyperlipidemia LDL goal <100   Refractory nausea and vomiting   Rheumatoid arthritis ([place])    Discharge Instructions   Allergies as of 01/17/2020      Reactions   Gabapentin    palpitation      Medication List    TAKE these medications   acetaminophen 325 MG tablet Commonly known as: TYLENOL Take 650 mg by mouth every 6 (six) hours as needed. For pain   aspirin 81 MG tablet Take 81 mg by mouth daily.   B-D TB SYRINGE 1CC/27GX1/2" 27G X 1/2" 1 ML Misc Generic drug: TUBERCULIN  SYR 1CC/27GX1/2" 1 each by Other route as directed.   benazepril 5 MG tablet Commonly known as: LOTENSIN TAKE 1 TABLET DAILY   cetirizine 10 MG tablet Commonly known as: ZYRTEC TAKE 1 TABLET DAILY   cloNIDine 0.1 MG tablet Commonly known as: CATAPRES TAKE 1 TABLET AT BEDTIME   folic acid 1 MG tablet Commonly known as: FOLVITE Take 3 mg by mouth daily.   hydrochlorothiazide 25 MG tablet Commonly known as: HYDRODIURIL TAKE 1 TABLET DAILY   lovastatin 40 MG tablet Commonly known as: MEVACOR Take 1 tablet  (40 mg total) by mouth at bedtime.   methotrexate (PF) 50 MG/2ML injection Inject 0.9 mLs into the muscle once a week. Every Sunday   Omega 3 1000 MG Caps Take 1 capsule by mouth daily.   omeprazole 40 MG capsule Commonly known as: PRILOSEC Take 1 capsule (40 mg total) by mouth daily. What changed:   medication strength  how much to take   potassium chloride SA 20 MEQ tablet Commonly known as: Klor-Con M20 Take 1 tablet (20 mEq total) by mouth 2 (two) times daily.   Probiotic Daily Caps Take 1 capsule by mouth daily.   sertraline 50 MG tablet Commonly known as: ZOLOFT TAKE 1 TABLET DAILY ( DOSE INCREASE )       Follow-up Information    [facility] Follow up.   Specialty: General Surgery Why: Our office is working on scheduling a follow up appointment for you to discuss hiatal hernia repair. If you have not heard from them in the next week to confirm appointment time/date, please call.  Contact information: [address] [place] [address] [phone]        Fayrene Helper, MD Follow up in 1 week(s).   Specialty: Family Medicine Contact information: [address] [place] [address] [phone]              Allergies  Allergen Reactions  . Gabapentin     palpitation    Consultations: GI and general surgery   Procedures/Studies: CT Abdomen Pelvis W Contrast  Result Date: 01/13/2020 CLINICAL DATA:  Abdominal abscess or infection expected. Abdominal tenderness and nausea and vomiting. Tachycardia. EXAM: CT ABDOMEN AND PELVIS WITH CONTRAST TECHNIQUE: Multidetector CT imaging of the abdomen and pelvis was performed using the standard protocol following bolus administration of intravenous contrast. CONTRAST:  112mL OMNIPAQUE IOHEXOL 300 MG/ML  SOLN COMPARISON:  11/25/2013 FINDINGS: Lower chest: Lung bases are clear. Large gastric hernia hernia in the LEFT hemithorax described in the stomach section.  Hepatobiliary: No focal hepatic lesion. No biliary duct dilatation. Gallbladder is normal. Common bile duct is normal. Pancreas: Pancreas is normal. No ductal dilatation. No pancreatic inflammation. Spleen: Normal spleen Adrenals/urinary tract: Adrenal glands and kidneys are normal. The ureters and bladder normal. Stomach/Bowel: The GE junction extends below the diaphragm. The gastric body and antrum above the hemidiaphragm in the LEFT hemithorax. This herniated portion of stomach is fluid-filled and distended. The proximal stomach is below the hemidiaphragm also fluid-filled and distended. 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 Duodenum and small bowel are normal. Small bowel is collapsed. Appendix and cecum normal. The colon is collapsed. Vascular/Lymphatic: Abdominal aorta is normal caliber. No periportal or retroperitoneal adenopathy. No pelvic adenopathy. Reproductive: Uterus and adnexa unremarkable. Other: No free fluid. Musculoskeletal: No aggressive osseous lesion. IMPRESSION: 1. Gastric body and antrum above the diaphragm while the GE junction and proximal stomach below the DG diaphragm. Findings are most  consistent with a organ axial gastric volvulus. Recommend general surgery consultation. 2. Stomach is fluid-filled and the small bowel and colon are decompressed consistent with GI obstruction. 3. Sliding-type hiatal hernia demonstrated on CT exam 6 years prior. Findings conveyed toELLIOTT WENTZ on 01/13/2020  at11:59. Electronically Signed   By: Suzy Bouchard M.D.   On: 01/13/2020 11:59   DG Abd Portable 1V  Result Date: 01/15/2020 CLINICAL DATA:  Evaluate NG tube EXAM: PORTABLE ABDOMEN - 1 VIEW COMPARISON:  None. FINDINGS: The NG tube terminates in the stomach. No gaseous distension identified. IMPRESSION: The NG tube terminates in the stomach without gaseous distension identified. Electronically Signed   By: Dorise Bullion III M.D   On: 01/15/2020 08:55   DG Abd Portable  1V  Result Date: 01/14/2020 CLINICAL DATA:  Check gastric catheter placement EXAM: PORTABLE ABDOMEN - 1 VIEW COMPARISON:  None. FINDINGS: Gastric catheter is noted coiled within the stomach. Scattered large and small bowel gas is noted. No other focal abnormality is seen. IMPRESSION: Gastric catheter coiled within the stomach. Electronically Signed   By: Inez Catalina M.D.   On: 01/14/2020 16:55      Discharge Exam: Vitals:   01/17/20 0454 01/17/20 0831  BP: 111/64 105/72  Pulse: 65 75  Resp: 18 18  Temp: 98.4 F (36.9 C)   SpO2: 95% 95%   Vitals:   01/16/20 1651 01/16/20 2052 01/17/20 0454 01/17/20 0831  BP: (!) 147/84 126/79 111/64 105/72  Pulse: 67 79 65 75  Resp: 18 15 18 18   Temp: 98.6 F (37 C) 98.2 F (36.8 C) 98.4 F (36.9 C)   TempSrc: Oral Oral Oral   SpO2: 94% 97% 95% 95%  Weight:  62.4 kg    Height:        General: Pt is alert, awake, not in acute distress Cardiovascular: RRR, S1/S2 +, no rubs, no gallops Respiratory: CTA bilaterally, no wheezing, no rhonchi Abdominal: Soft, NT, ND, bowel sounds + Extremities: no edema, no cyanosis    The results of significant diagnostics from this hospitalization (including imaging, microbiology, ancillary and laboratory) are listed below for reference.     Microbiology: Recent Results (from the past 240 hour(s))  Culture, Urine     Status: Abnormal   Collection Time: 01/12/20 10:18 AM   Specimen: Urine, Clean Catch  Result Value Ref Range Status   Specimen Description   Final    URINE, CLEAN CATCH Performed at [facility], [address], [place], [place] [address]    Special Requests   Final    NONE Performed at [facility], [address], [place], [place] [address]    Culture (A)  Final    <10,000 COLONIES/mL INSIGNIFICANT GROWTH Performed at [facility] [address] [place], [place] [address]    Report Status 01/13/2020 FINAL  Final  SARS Coronavirus 2 by RT PCR (hospital order, performed in  [facility] hospital lab) Nasopharyngeal Nasopharyngeal Swab     Status: None   Collection Time: 01/13/20 12:09 PM   Specimen: Nasopharyngeal Swab  Result Value Ref Range Status   SARS Coronavirus 2 NEGATIVE NEGATIVE Final    Comment: (NOTE) SARS-CoV-2 target nucleic acids are NOT DETECTED.  The SARS-CoV-2 RNA is generally detectable in upper and lower respiratory specimens during the acute phase of infection. The lowest concentration of SARS-CoV-2 viral copies this assay can detect is 250 copies / mL. A negative result does not preclude SARS-CoV-2 infection and should not be used as  the sole basis for treatment or other patient management decisions.  A negative result may occur with improper specimen collection / handling, submission of specimen other than nasopharyngeal swab, presence of viral mutation(s) within the areas targeted by this assay, and inadequate number of viral copies (<250 copies / mL). A negative result must be combined with clinical observations, patient history, and epidemiological information.  Fact Sheet for Patients:   StrictlyIdeas.no  Fact Sheet for Healthcare Providers: BankingDealers.co.za  This test is not yet approved or  cleared by the [place] FDA and has been authorized for detection and/or diagnosis of SARS-CoV-2 by FDA under an Emergency Use Authorization (EUA).  This EUA will remain in effect (meaning this test can be used) for the duration of the COVID-19 declaration under Section 564(b)(1) of the Act, 21 U.S.C. section 360bbb-3(b)(1), unless the authorization is terminated or revoked sooner.  Performed at [facility], [address] [place],  [address]      Labs: BNP (last 3 results) No results for input(s): BNP in the last 8760 hours. Basic Metabolic Panel: Recent Labs  Lab 01/13/20 0926 01/13/20 1339 01/14/20 0445 01/15/20 0319 01/16/20 0423  NA 140  --  139 145 137  K  2.7*  --  2.9* 3.9 3.7  CL 91*  --  100 107 106  CO2 30  --  24 26 22   GLUCOSE 166*  --  180* 103* 125*  BUN 14  --  9 15 8   CREATININE 0.82  --  0.55 0.63 0.54  CALCIUM 10.2  --  9.2 9.0 8.6*  MG  --  1.8  --  1.7  --    Liver Function Tests: Recent Labs  Lab 01/13/20 0926 01/14/20 0445 01/15/20 0319 01/16/20 0423  AST 20 18 17 17   ALT 22 20 17 15   ALKPHOS 76 66 48 48  BILITOT 0.9 0.7 0.7 0.6  PROT 9.0* 7.6 6.4* 5.8*  ALBUMIN 4.4 3.8 3.1* 2.8*   Recent Labs  Lab 01/13/20 0926  LIPASE 25   No results for input(s): AMMONIA in the last 168 hours. CBC: Recent Labs  Lab 01/13/20 0926 01/14/20 0445 01/15/20 0319 01/16/20 0423 01/17/20 0457  WBC 10.5 12.4* 8.5 7.2 7.2  NEUTROABS 7.4  --  5.9 4.5 4.5  HGB 15.5* 14.3 12.6 12.2 12.3  HCT 46.2* 43.1 38.6 37.7 37.2  MCV 85.9 86.0 88.7 89.3 87.9  PLT 397 342 266 214 226   Cardiac Enzymes: No results for input(s): CKTOTAL, CKMB, CKMBINDEX, TROPONINI in the last 168 hours. BNP: Invalid input(s): POCBNP CBG: Recent Labs  Lab 01/16/20 0721 01/16/20 1127 01/16/20 1624 01/16/20 2058 01/17/20 0637  GLUCAP 100* 89 86 120* 105*   D-Dimer No results for input(s): DDIMER in the last 72 hours. Hgb A1c No results for input(s): HGBA1C in the last 72 hours. Lipid Profile No results for input(s): CHOL, HDL, LDLCALC, TRIG, CHOLHDL, LDLDIRECT in the last 72 hours. Thyroid function studies No results for input(s): TSH, T4TOTAL, T3FREE, THYROIDAB in the last 72 hours.  Invalid input(s): FREET3 Anemia work up No results for input(s): VITAMINB12, FOLATE, FERRITIN, TIBC, IRON, RETICCTPCT in the last 72 hours. Urinalysis    Component Value Date/Time   COLORURINE YELLOW 01/13/2020 1154   APPEARANCEUR CLEAR 01/13/2020 1154   LABSPEC 1.036 (H) 01/13/2020 1154   PHURINE 8.0 01/13/2020 1154   GLUCOSEU NEGATIVE 01/13/2020 1154   HGBUR NEGATIVE 01/13/2020 1154   HGBUR negative 04/06/2009 Scalp Level 01/13/2020  1154  BILIRUBINUR small (A) 01/11/2020 1615   BILIRUBINUR small 11/17/2013 0854   KETONESUR 20 (A) 01/13/2020 1154   PROTEINUR 30 (A) 01/13/2020 1154   UROBILINOGEN 1.0 01/11/2020 1615   UROBILINOGEN 0.2 04/06/2009 0758   NITRITE NEGATIVE 01/13/2020 1154   LEUKOCYTESUR NEGATIVE 01/13/2020 1154   Sepsis Labs Invalid input(s): PROCALCITONIN,  WBC,  LACTICIDVEN Microbiology Recent Results (from the past 240 hour(s))  Culture, Urine     Status: Abnormal   Collection Time: 01/12/20 10:18 AM   Specimen: Urine, Clean Catch  Result Value Ref Range Status   Specimen Description   Final    URINE, CLEAN CATCH Performed at [facility], [address] [place], [place] [address]    Special Requests   Final    NONE Performed at [facility], [address] [place], [place] [address]    Culture (A)  Final    <10,000 COLONIES/mL INSIGNIFICANT GROWTH Performed at [facility], [place] [address], [place], [place] [address]    Report Status 01/13/2020 FINAL  Final  SARS Coronavirus 2 by RT PCR (hospital order, performed in [facility] hospital lab) Nasopharyngeal Nasopharyngeal Swab     Status: None   Collection Time: 01/13/20 12:09 PM   Specimen: Nasopharyngeal Swab  Result Value Ref Range Status   SARS Coronavirus 2 NEGATIVE NEGATIVE Final    Comment: (NOTE) SARS-CoV-2 target nucleic acids are NOT DETECTED.  The SARS-CoV-2 RNA is generally detectable in upper and lower respiratory specimens during the acute phase of infection. The lowest concentration of SARS-CoV-2 viral copies this assay can detect is 250 copies / mL. A negative result does not preclude SARS-CoV-2 infection and should not be used as the sole basis for treatment or other patient management decisions.  A negative result may occur with improper specimen collection / handling, submission of specimen other than nasopharyngeal swab, presence of viral mutation(s) within the areas targeted by this assay, and inadequate  number of viral copies (<250 copies / mL). A negative result must be combined with clinical observations, patient history, and epidemiological information.  Fact Sheet for Patients:   StrictlyIdeas.no  Fact Sheet for Healthcare Providers: BankingDealers.co.za  This test is not yet approved or  cleared by the [place] FDA and has been authorized for detection and/or diagnosis of SARS-CoV-2 by FDA under an Emergency Use Authorization (EUA).  This EUA will remain in effect (meaning this test can be used) for the duration of the COVID-19 declaration under Section 564(b)(1) of the Act, 21 U.S.C. section 360bbb-3(b)(1), unless the authorization is terminated or revoked sooner.  Performed at [facility], [address] [place], [place] [address]      Time coordinating discharge: Over 30 minutes  SIGNED:   Darliss Cheney, MD  [facility] 01/17/2020, 9:39 AM  If 7PM-7AM, please contact night-coverage [email or web address]

## 2020-01-19 ENCOUNTER — Other Ambulatory Visit: Payer: Self-pay

## 2020-01-19 ENCOUNTER — Inpatient Hospital Stay: Admitting: Family Medicine

## 2020-01-19 ENCOUNTER — Telehealth: Payer: Self-pay

## 2020-01-19 DIAGNOSIS — R918 Other nonspecific abnormal finding of lung field: Secondary | ICD-10-CM

## 2020-01-19 MED FILL — Ondansetron HCl Inj 4 MG/2ML (2 MG/ML): INTRAMUSCULAR | Qty: 2 | Status: AC

## 2020-01-19 NOTE — Telephone Encounter (Signed)
Transition Care Management Follow-up Telephone Call   Date discharged?   01/17/20             How have you been since you were released from the hospital?    Do you understand why you were in the hospital?    Do you understand the discharge instructions?   Where were you discharged to?    Items Reviewed:  Medications reviewed:   Allergies reviewed:   Dietary changes reviewed:   Referrals reviewed:    Functional Questionnaire:   Activities of Daily Living (ADLs):      Any transportation issues/concerns?:    Any patient concerns?    Confirmed importance and date/time of follow-up visits scheduled      Confirmed with patient if condition begins to worsen call PCP or go to the ER.  Patient was given the office number and encouraged to call back with question or concerns.  :

## 2020-01-19 NOTE — Telephone Encounter (Signed)
[id number]  Attempted to contact patient to complete [facility] telephone call. No answer. Left message requesting call back. Will try again later. 2nd attempt

## 2020-01-20 ENCOUNTER — Other Ambulatory Visit: Payer: Self-pay

## 2020-01-20 DIAGNOSIS — E1159 Type 2 diabetes mellitus with other circulatory complications: Secondary | ICD-10-CM

## 2020-01-20 DIAGNOSIS — I10 Essential (primary) hypertension: Secondary | ICD-10-CM

## 2020-01-21 LAB — CBC
HCT: 37.7 % (ref 35.0–45.0)
Hemoglobin: 12.9 g/dL (ref 11.7–15.5)
MCH: 29.1 pg (ref 27.0–33.0)
MCHC: 34.2 g/dL (ref 32.0–36.0)
MCV: 85.1 fL (ref 80.0–100.0)
MPV: 13.4 fL — ABNORMAL HIGH (ref 7.5–12.5)
Platelets: 279 10*3/uL (ref 140–400)
RBC: 4.43 10*6/uL (ref 3.80–5.10)
RDW: 12.9 % (ref 11.0–15.0)
WBC: 6.3 10*3/uL (ref 3.8–10.8)

## 2020-01-21 LAB — BASIC METABOLIC PANEL WITH GFR
BUN: 7 mg/dL (ref 7–25)
CO2: 30 mmol/L (ref 20–32)
Calcium: 9.6 mg/dL (ref 8.6–10.4)
Chloride: 107 mmol/L (ref 98–110)
Creat: 0.54 mg/dL (ref 0.50–0.99)
GFR, Est African American: 118 mL/min/{1.73_m2} (ref >=60)
GFR, Est Non African American: 102 mL/min/{1.73_m2} (ref >=60)
Glucose, Bld: 89 mg/dL (ref 65–139)
Potassium: 4.2 mmol/L (ref 3.5–5.3)
Sodium: 141 mmol/L (ref 135–146)

## 2020-01-25 ENCOUNTER — Ambulatory Visit (INDEPENDENT_AMBULATORY_CARE_PROVIDER_SITE_OTHER): Admitting: Family Medicine

## 2020-01-25 ENCOUNTER — Encounter: Payer: Self-pay | Admitting: Family Medicine

## 2020-01-25 ENCOUNTER — Other Ambulatory Visit: Payer: Self-pay

## 2020-01-25 VITALS — BP 106/66 | HR 82 | Temp 98.4°F | Resp 16 | Ht 59.0 in | Wt 133.0 lb

## 2020-01-25 DIAGNOSIS — E1159 Type 2 diabetes mellitus with other circulatory complications: Secondary | ICD-10-CM | POA: Diagnosis not present

## 2020-01-25 DIAGNOSIS — Z23 Encounter for immunization: Secondary | ICD-10-CM | POA: Diagnosis not present

## 2020-01-25 DIAGNOSIS — Z7689 Persons encountering health services in other specified circumstances: Secondary | ICD-10-CM

## 2020-01-25 DIAGNOSIS — F17200 Nicotine dependence, unspecified, uncomplicated: Secondary | ICD-10-CM | POA: Diagnosis not present

## 2020-01-25 NOTE — Patient Instructions (Addendum)
Annual physical exam in office with MD end October call if you need me sooner.  Thankful that you are much improved from your previous visit.  Very important that you follow through with the plan for surgery.  Tdap in office today.  Please schedule October 15 or after mammogram at time of discharge.  Very good that you stop smoking please continue to refrain from smoking.  Please schedule appointment for diabetic eye exam at time of checkout.  Thanks for choosing [facility], we consider it a privelige to serve you.

## 2020-01-25 NOTE — Progress Notes (Signed)
   Jaime Murray     MRN: [id number]      DOB: February 06, 1956   HPI [name] is here for follow up and re-evaluation following recent hospitalization from June 24 to June 28 because of gastric volvulus.  Patient reports ability to eat regular diet and has very mild lower abdominal pain and some she denies any nausea or vomiting.  She is now taking Benefiber daily and her stool is regular.  At the time of her admission she also had partial obstruction.  Hospital course radiologic and lab data reviewed with the patient and all questions answered. Appropriate follow-up from the recent hospitalization is carried out. States she feels 50 % improved   ROS See HPI  Denies recent fever or chills. Denies sinus pressure, nasal congestion, ear pain or sore throat. Denies chest congestion, productive cough or wheezing. Denies chest pains, palpitations and leg swelling .   Denies dysuria, frequency, hesitancy or incontinence. Denies joint pain, swelling and limitation in mobility. Denies headaches, seizures, numbness, or tingling. Denies depression, anxiety or insomnia. Denies skin break down or rash.   PE  BP 106/66   Pulse 82   Temp 98.4 F (36.9 C)   Resp 16   Ht 4\' 11"  (1.499 m)   Wt 133 lb (60.3 kg)   SpO2 96%   BMI 26.86 kg/m   Patient alert and oriented and in no cardiopulmonary distress.  HEENT: No facial asymmetry, EOMI,     Neck supple .  Chest: Clear to auscultation bilaterally.  CVS: S1, S2 no murmurs, no S3.Regular rate.  ABD: Soft non tender.   Ext: No edema  MS: Adequate ROM spine, shoulders, hips and knees.  Skin: Intact, no ulcerations or rash noted.  Psych: Good eye contact, normal affect. Memory intact not anxious or depressed appearing.  CNS: CN 2-12 intact, power,  normal throughout.no focal deficits noted.   Assessment & Plan  Encounter for support and coordination of transition of care Patient in for follow up of recent  hospitalization. Discharge summary, and laboratory and radiology data are reviewed, and any questions or concerns about recent hospitalization are discussed. Specific issues requiring follow up are specifically addressed.   Need for Tdap vaccination After obtaining informed consent, the vaccine is  administered , with no adverse effect noted at the time of administration.   NICOTINE ADDICTION Quit x 1 week and has no desire to smoke, applauded on this and encouraged to remain nicotine free  DM (diabetes mellitus) ([place]) Diet controlled, needs eye exam

## 2020-01-25 NOTE — Assessment & Plan Note (Signed)
After obtaining informed consent, the vaccine is  administered , with no adverse effect noted at the time of administration.  

## 2020-01-25 NOTE — Assessment & Plan Note (Signed)
Patient in for follow up of recent hospitalization. Discharge summary, and laboratory and radiology data are reviewed, and any questions or concerns about recent hospitalization are discussed. Specific issues requiring follow up are specifically addressed.  

## 2020-01-25 NOTE — Assessment & Plan Note (Signed)
Quit x 1 week and has no desire to smoke, applauded on this and encouraged to remain nicotine free

## 2020-01-25 NOTE — Assessment & Plan Note (Signed)
Diet controlled, needs eye exam

## 2020-02-08 ENCOUNTER — Ambulatory Visit (HOSPITAL_COMMUNITY)
Admission: RE | Admit: 2020-02-08 | Discharge: 2020-02-08 | Disposition: A | Discharge status: Home / Self Care | Source: Ambulatory Visit | Attending: Family Medicine | Admitting: Family Medicine

## 2020-02-08 ENCOUNTER — Other Ambulatory Visit: Payer: Self-pay

## 2020-02-08 ENCOUNTER — Other Ambulatory Visit: Payer: Self-pay | Admitting: General Surgery

## 2020-02-08 DIAGNOSIS — K219 Gastro-esophageal reflux disease without esophagitis: Secondary | ICD-10-CM

## 2020-02-08 DIAGNOSIS — R918 Other nonspecific abnormal finding of lung field: Secondary | ICD-10-CM | POA: Diagnosis present

## 2020-02-08 MED ORDER — IOHEXOL 300 MG/ML  SOLN
75.0000 mL | Freq: Once | INTRAMUSCULAR | Status: AC | PRN
  Administered 2020-02-08: 75 mL via INTRAVENOUS

## 2020-02-14 ENCOUNTER — Ambulatory Visit
Admission: RE | Admit: 2020-02-14 | Discharge: 2020-02-14 | Disposition: A | Discharge status: Home / Self Care | Source: Ambulatory Visit | Attending: General Surgery | Admitting: General Surgery

## 2020-02-14 DIAGNOSIS — K219 Gastro-esophageal reflux disease without esophagitis: Secondary | ICD-10-CM

## 2020-02-18 ENCOUNTER — Other Ambulatory Visit: Payer: Self-pay

## 2020-02-18 ENCOUNTER — Encounter: Payer: Self-pay | Admitting: Internal Medicine

## 2020-02-18 ENCOUNTER — Ambulatory Visit (INDEPENDENT_AMBULATORY_CARE_PROVIDER_SITE_OTHER): Admitting: Internal Medicine

## 2020-02-18 VITALS — BP 128/72 | HR 68 | Ht 59.0 in | Wt 130.0 lb

## 2020-02-18 DIAGNOSIS — Z01810 Encounter for preprocedural cardiovascular examination: Secondary | ICD-10-CM | POA: Diagnosis not present

## 2020-02-18 DIAGNOSIS — E782 Mixed hyperlipidemia: Secondary | ICD-10-CM

## 2020-02-18 NOTE — Patient Instructions (Signed)
Medication Instructions:  °No changes °*If you need a refill on your cardiac medications before your next appointment, please call your pharmacy* ° ° °Lab Work: °none °If you have labs (blood work) drawn today and your tests are completely normal, you will receive your results only by: °MyChart Message (if you have MyChart) OR °A paper copy in the mail °If you have any lab test that is abnormal or we need to change your treatment, we will call you to review the results. ° ° °Testing/Procedures: °none ° ° °Follow-Up: °As needed ° °Other Instructions °  °

## 2020-02-18 NOTE — Progress Notes (Signed)
Cardiology Office Note   Date:  02/18/2020   ID:  Jaime Murray, DOB March 25, 1956, MRN [id number]  PCP:  Fayrene Helper, MD  Cardiologist:   Dorris Carnes, MD       History of Present Illness: Jaime Murray is a 64 y.o. female with a history of CP and SOB.  She also has a hx of a hiatal hernia, RA and tobacco abuse   She was seen by Court Joy in 2015  Nuclear stress test was normal   Echo showed LVEF 55 to 60% with Gr II diastolic dysfunction    The pt is currently being evaluated ofr esophageal surgery    SHe has chest pressure that is not associated with activity   Long lasting, most likely related to GI problems and food  .      Current Meds  Medication Sig   acetaminophen (TYLENOL) 325 MG tablet Take 650 mg by mouth every 6 (six) hours as needed. For pain   aspirin 81 MG tablet Take 81 mg by mouth daily.     B-D TB SYRINGE 1CC/27GX1/2" 27G X 1/2" 1 ML MISC 1 each by Other route as directed.   benazepril (LOTENSIN) 5 MG tablet TAKE 1 TABLET DAILY   cetirizine (ZYRTEC) 10 MG tablet TAKE 1 TABLET DAILY   cloNIDine (CATAPRES) 0.1 MG tablet TAKE 1 TABLET AT BEDTIME   folic acid (FOLVITE) 1 MG tablet Take 3 mg by mouth daily.   hydrochlorothiazide (HYDRODIURIL) 25 MG tablet TAKE 1 TABLET DAILY   lovastatin (MEVACOR) 40 MG tablet Take 1 tablet (40 mg total) by mouth at bedtime.   Methotrexate Sodium (METHOTREXATE, PF,) 50 MG/2ML injection Inject 0.9 mLs into the muscle once a week. Every Sunday   omeprazole (PRILOSEC) 40 MG capsule Take one capsule by mouth two times daily   potassium chloride SA (KLOR-CON M20) 20 MEQ tablet Take 1 tablet (20 mEq total) by mouth 2 (two) times daily.   Probiotic Product (PROBIOTIC DAILY) CAPS Take 1 capsule by mouth daily.   sertraline (ZOLOFT) 50 MG tablet TAKE 1 TABLET DAILY ( DOSE INCREASE )   Current Facility-Administered Medications for the 02/18/20 encounter (Office Visit) with Fay Records, MD  Medication    Influenza (>/= 3 years) inactive virus vaccine (FLVIRIN/FLUZONE) injection SUSP 0.5 mL     Allergies:   Gabapentin   Past Medical History:  Diagnosis Date   Anxiety    Constipation    Depression    DM (diabetes mellitus) (HCC)    Fatigue    GERD (gastroesophageal reflux disease)    HTN (hypertension)    Hypercholesteremia    IDA (iron deficiency anemia)     Past Surgical History:  Procedure Laterality Date   COLONOSCOPY  01/01/10   Dr. Hansel Feinstein and sigmoid polyps, diminutive rectosigmoid polyp, tubular adenomas   COLONOSCOPY N/A 12/06/2015   Procedure: COLONOSCOPY;  Surgeon: Daneil Dolin, MD;  Location: [facility];  Service: Endoscopy;  Laterality: N/A;  730   ESOPHAGOGASTRODUODENOSCOPY  12/14/09   Dr. Rourk:tight schatzki's ring s/p dilation/moderate size hiatal hernia   GANGLION CYST EXCISION  05/22/2012   Procedure: REMOVAL GANGLION OF WRIST;  Surgeon: Carole Civil, MD;  Location: [facility];  Service: Orthopedics;  Laterality: Left;     Social History:  The patient  reports that she has been smoking cigarettes. She has a 15.00 pack-year smoking history. She has never used smokeless tobacco. She reports current alcohol use. She reports that she  does not use drugs.   Family History:  The patient's family history includes Arthritis in an other family member; Colon cancer in her mother; Heart disease in an other family member; Hypertension in her father and mother; Stroke in her father.    ROS:  Please see the history of present illness. All other systems are reviewed and  Negative to the above problem except as noted.    PHYSICAL EXAM: VS:  BP 128/72    Pulse 68    Ht 4\' 11"  (1.499 m)    Wt 130 lb (59 kg)    SpO2 97%    BMI 26.26 kg/m   GEN: Overweight 64 yo in no acute distress  HEENT: normal  Neck: no JVD, carotid bruits Cardiac: RRR; no murmurs, rubs, or gallops,no LE  edema  Respiratory:  clear to auscultation bilaterally GI: soft, nontender,  nondistended, + BS  No hepatomegaly  MS: no deformity Moving all extremities   Skin: warm and dry, no rash Neuro:  Strength and sensation are intact Psych: euthymic mood, full affect   EKG:  EKG is not ordered today.  On June 24:  ST 102 bpm   Nonspecific ST changes     Lipid Panel    Component Value Date/Time   CHOL 191 09/30/2019 0807   TRIG 108 09/30/2019 0807   HDL 66 09/30/2019 0807   CHOLHDL 2.9 09/30/2019 0807   VLDL 20 11/28/2016 0718   LDLCALC 105 (H) 09/30/2019 0807      Wt Readings from Last 3 Encounters:  02/18/20 130 lb (59 kg)  01/25/20 133 lb (60.3 kg)  01/16/20 137 lb 9.1 oz (62.4 kg)      ASSESSMENT AND PLAN:  1  Preop risk assessment.   The pt is being evaluated for GI surgery for hiatal hernia  She has chest pressure but I do not think it is cardiac in origin.   OK to proceed without further testing   2  Atherosclerosis Pt had CT this month that showed mild atherosclerosis of aorta   Would recomm a statin to lower her cholesterol     LDL in March 2021 was 105  Would recomm  Switching from lovastatin to Crestor Would do this after surgery   Defer to Dr Moshe Cipro  3  Lipids   As noted above   WOuld switch to Crestor 10 mg with tighter control of lipids   Current medicines are reviewed at length with the patient today.  The patient does not have concerns regarding medicines.  Signed, Dorris Carnes, MD  02/18/2020 1:53 PM    [address], [place], [place]  [address] Phone: [phone]; Fax: [phone]

## 2020-02-22 ENCOUNTER — Ambulatory Visit: Admitting: Family Medicine

## 2020-03-01 ENCOUNTER — Ambulatory Visit: Payer: Self-pay | Admitting: General Surgery

## 2020-03-01 NOTE — H&P (Signed)
Jaime Murray Form Appointment: 02/02/2020 9:00 AM Location: [facility] Patient #: [id number] DOB: 1956/04/04 Married / Language: English / Race: Black or African American Female   History of Present Illness Randall Hiss M. Garmon Dehn MD; 02/02/2020 9:47 AM) The patient is a 64 year old female who presents with gastroesophageal reflux disease. She comes in to the clinic after being evaluated in the hospital for gastric volvulus. She was admitted from June 24 through the 28th. She was managed with NG tube decompression. She is accompanied by her daughter. She reports a several year history of intermittent regurgitation generally in about once a month but in June it increased significantly in frequency risk she was sterilely vomiting within about 15 minutes after eating a meal. It acutely worsened around June 24. She had weakness and was sent to the emergency room. She was hypokalemic. The CT scan demonstrated that the gastric body and antrum were above the diaphragm while the GE junction and proximal stomach were below the diaphragm consistent with organoaxial gastric volvulus. Nasogastric decompression was performed and she improved significantly. Surgery and GI medicine for seeing the patient. She is still on a soft diet. She is now on omeprazole 40 mg once a day. They had increased that while she is in the hospital. She is had about a 20 pound weight loss over the past month and a half or so. She also reports early satiety. She will occasionally have an acid sensation in her mouth. She states that she occasionally [place] but can't. She denies any burning sensation per se. She had an upper endoscopy last in 2011 and had dilation along with a moderate size hiatal hernia. I cannot access the actual report per se. Her last in the hospital were unremarkable except for an albumin of 2.8. She does have rheumatoid arthritis and was on methotrexate but it was stopped on discharge. She  smokes but is trying to cut down. A packable last 3 days. She also has well-controlled diabetes mellitus. She also has hypertension.  She denies any chest pressure. She denies any shunts breath at rest. She may get some dyspnea on exertion. No orthopnea. No paroxysmal internal dyspnea. No peripheral edema. No TIAs or amaurosis fugax. No history of blood clots. No prior abdominal surgery.  I reviewed her hospital discharge summary, the consultant notes from the hospital, CT scan from June 24, as well as her PCPs note from July 6   Problem List/Past Medical Leighton Ruff. 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)  This patient encounter took 39 minutes today to perform the following: take history, perform exam, review outside records, interpret imaging, counsel the patient on their diagnosis and document encounter, findings & plan in the EHR TOBACCO USE (Z72.0)   Past Surgical History Sabino Gasser, [place]; 02/02/2020 8:53 AM) No pertinent past surgical history   Diagnostic Studies History Sabino Gasser, [place]; 02/02/2020 8:53 AM) Colonoscopy  1-5 years ago Mammogram  within last year Pap Smear  1-5 years ago  Allergies Sabino Gasser, CMA; 02/02/2020 8:55 AM) Gabapentin *ANTICONVULSANTS*  Allergies Reconciled   Medication History Sabino Gasser, CMA; 02/02/2020 8:54 AM) Cetirizine HCl (10MG  Tablet, Oral) Active. Lovastatin (40MG  Tablet, Oral) Active. hydroCHLOROthiazide (25MG  Tablet, Oral) Active. cloNIDine HCl (0.1MG  Tablet, Oral) Active. Folic Acid (1MG  Tablet, Oral) Active. Omeprazole (20MG  Capsule DR, Oral) Active. Sertraline HCl (50MG  Tablet, Oral) Active. Benazepril HCl (5MG  Tablet, Oral) Active. Medications Reconciled  Social History Sabino Gasser, [place]; 02/02/2020 8:53 AM)  Alcohol use  Occasional alcohol use. Caffeine use  Coffee. No drug use  Tobacco use  Current every day  smoker.  Family History Sabino Gasser, [place]; 02/02/2020 8:53 AM) Arthritis  Father, Mother, Sister. Cerebrovascular Accident  Father. Colon Cancer  Mother. Heart Disease  Father. Hypertension  Father, Mother. Migraine Headache  Sister. Thyroid problems  Mother, Sister.  Pregnancy / Birth History Sabino Gasser, [place]; 02/02/2020 8:53 AM) Age at menarche  43 years. Age of menopause  49-55 Gravida  2 Maternal age  66-20 Para  2  Other Problems Randall Hiss M. Redmond Pulling, MD; 02/02/2020 9:54 AM) Gastroesophageal Reflux Disease  Inguinal Hernia  High blood pressure  Arthritis  Diabetes Mellitus     Review of Systems Sabino Gasser CMA; 02/02/2020 8:53 AM) General Not Present- Appetite Loss, Chills, Fatigue, Fever, Night Sweats, Weight Gain and Weight Loss. Skin Not Present- Change in Wart/Mole, Dryness, Hives, Jaundice, New Lesions, Non-Healing Wounds, Rash and Ulcer. HEENT Not Present- Earache, Hearing Loss, Hoarseness, Nose Bleed, Oral Ulcers, Ringing in the Ears, Seasonal Allergies, Sinus Pain, Sore Throat, Visual Disturbances, Wears glasses/contact lenses and Yellow Eyes. Respiratory Not Present- Bloody sputum, Chronic Cough, Difficulty Breathing, Snoring and Wheezing. Breast Not Present- Breast Mass, Breast Pain, Nipple Discharge and Skin Changes. Cardiovascular Not Present- Chest Pain, Difficulty Breathing Lying Down, Leg Cramps, Palpitations, Rapid Heart Rate, Shortness of Breath and Swelling of Extremities. Gastrointestinal Not Present- Abdominal Pain, Bloating, Bloody Stool, Change in Bowel Habits, Chronic diarrhea, Constipation, Difficulty Swallowing, Excessive gas, Gets full quickly at meals, Hemorrhoids, Indigestion, Nausea, Rectal Pain and Vomiting. Female Genitourinary Not Present- Frequency, Nocturia, Painful Urination, Pelvic Pain and Urgency. Musculoskeletal Not Present- Back Pain, Joint Pain, Joint Stiffness, Muscle Pain, Muscle Weakness and Swelling of  Extremities. Neurological Not Present- Decreased Memory, Fainting, Headaches, Numbness, Seizures, Tingling, Tremor, Trouble walking and Weakness. Psychiatric Not Present- Anxiety, Bipolar, Change in Sleep Pattern, Depression, Fearful and Frequent crying. Endocrine Not Present- Cold Intolerance, Excessive Hunger, Hair Changes, Heat Intolerance, Hot flashes and New Diabetes. Hematology Not Present- Blood Thinners, Easy Bruising, Excessive bleeding, Gland problems, HIV and Persistent Infections.  Vitals Sabino Gasser CMA; 02/02/2020 8:55 AM) 02/02/2020 8:55 AM Weight: 131.2 lb Height: 59in Body Surface Area: 1.54 m Body Mass Index: 26.5 kg/m  Temp.: 98.45F (Tympanic)  Pulse: 102 (Regular)  BP: 132/80(Sitting, Left Arm, Standard)       Physical Exam Randall Hiss M. Keshawna Dix MD; 02/02/2020 9:47 AM) General Mental Status-Alert. General Appearance-Consistent with stated age. Hydration-Well hydrated. Voice-Normal.  Head and Neck Head-normocephalic, atraumatic with no lesions or palpable masses. Trachea-midline. Thyroid Gland Characteristics - normal size and consistency.  Eye Eyeball - Bilateral-Extraocular movements intact. Sclera/Conjunctiva - Bilateral-No scleral icterus.  Chest and Lung Exam Chest and lung exam reveals -quiet, even and easy respiratory effort with no use of accessory muscles and on auscultation, normal breath sounds, no adventitious sounds and normal vocal resonance. Inspection Chest Wall - Normal. Back - normal.  Breast - Did not examine.  Cardiovascular Cardiovascular examination reveals -normal heart sounds, regular rate and rhythm with no murmurs and normal pedal pulses bilaterally.  Abdomen Inspection Inspection of the abdomen reveals - No Hernias. Skin - Scar - no surgical scars. Palpation/Percussion Palpation and Percussion of the abdomen reveal - Soft, Non Tender, No Rebound tenderness, No Rigidity (guarding) and No  hepatosplenomegaly. Auscultation Auscultation of the abdomen reveals - Bowel sounds normal.  Peripheral Vascular Upper Extremity Palpation - Pulses bilaterally normal.  Neurologic Neurologic evaluation reveals -alert and oriented x 3 with no impairment of  recent or remote memory. Mental Status-Normal.  Neuropsychiatric The patient's mood and affect are described as -normal. Judgment and Insight-insight is appropriate concerning matters relevant to self.  Musculoskeletal Normal Exam - Left-Upper Extremity Strength Normal and Lower Extremity Strength Normal. Normal Exam - Right-Upper Extremity Strength Normal and Lower Extremity Strength Normal.  Lymphatic Head & Neck  General Head & Neck Lymphatics: Bilateral - Description - Normal. Axillary - Did not examine. Femoral & Inguinal - Did not examine.    Assessment & Plan Randall Hiss M. 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 We discussed the anatomy. This needs to be repaired surgically. She was given Neurosurgeon. I would also like to get an upper GI prior to surgery. I will discuss pros and cons of manometry with her gastroenterologist. We formally discussed robotic repair of paraesophageal hernia with partial fundoplication and gastropexy with possible mesh. We discussed the steps of the procedure which would be reducing the stomach into the abdomen, closing the diaphragm with possible mesh depending on the condition of the diaphragm muscle along with the size of the defect. We discussed that we generally would  do at least a partial wrap. At don't think we would do a full wrap. We discussed the pros and cons of partial fundoplication versus full fundoplication. We will also perform a gastropexy. We discussed the typical hospitalization. We discussed the typical postoperative course. We discussed the diet progression. We discussed the risk of persistent heartburn. We discussed dysphagia after surgery. We discussed that there is fair risk of hernia recurrence. We discussed other risks and benefits including but not limited to bleeding, infection, injury to surrounding structures such as the aorta, lung, spleen, esophagus, stomach; blood clots, perioperative cardiac and pulmonary events, readmission. We discussed pain control after surgery and pain expectations. We discussed the use of the surgical robot. Discuss gas bloat and sometimes difficulty vomiting. Current Plans Pt Education - Pamphlet Given - Gastroesophagel Reflux Disease: discussed with patient and provided information. TOBACCO USE (Z72.0) Impression: We discussed that since the hiatal hernia has a fair risk of recurrence we need to do everything we can to minimize other issues that can increase healing issues such as tobacco use as well as potential medications. We discussed the importance of smoking cessation. She was given Neurosurgeon Current Plans Pt Education - Smoking: Ways to Quit: tobacco RHEUMATOID ARTHRITIS (M06.9) Impression: It would be ideal to have her stay off methotrexate for healing purposes but we will discuss her care with her rheumatologist and get her opinion about whether or not we can hold the methotrexate for the next 6 weeks or so DIET-CONTROLLED DIABETES MELLITUS (E11.9) ESSENTIAL HYPERTENSION (I10) Impression: Given her history of hypertension, diabetes mellitus, and tobacco use think having a preoperative cardiac assessment would be important. She will be referred for cardiac clearance.  Leighton Ruff. Redmond Pulling, MD,  FACS General, Bariatric, & Minimally Invasive Surgery [facility], [place]

## 2020-03-01 NOTE — H&P (View-Only) (Signed)
Jaime Murray Form Appointment: 02/02/2020 9:00 AM Location: [facility] Patient #: [id number] DOB: [birth year] Married / Language: English / Race: Black or African American Female   History of Present Illness Jaime Hiss M. Kowen Kluth MD; 02/02/2020 9:47 AM) The patient is a 64 year old female who presents with gastroesophageal reflux disease. She comes in to the clinic after being evaluated in the hospital for gastric volvulus. She was admitted from June 24 through the 28th. She was managed with NG tube decompression. She is accompanied by her daughter. She reports a several year history of intermittent regurgitation generally in about once a month but in June it increased significantly in frequency risk she was sterilely vomiting within about 15 minutes after eating a meal. It acutely worsened around June 24. She had weakness and was sent to the emergency room. She was hypokalemic. The CT scan demonstrated that the gastric body and antrum were above the diaphragm while the GE junction and proximal stomach were below the diaphragm consistent with organoaxial gastric volvulus. Nasogastric decompression was performed and she improved significantly. Surgery and GI medicine for seeing the patient. She is still on a soft diet. She is now on omeprazole 40 mg once a day. They had increased that while she is in the hospital. She is had about a 20 pound weight loss over the past month and a half or so. She also reports early satiety. She will occasionally have an acid sensation in her mouth. She states that she occasionally [place] but can't. She denies any burning sensation per se. She had an upper endoscopy last in 2011 and had dilation along with a moderate size hiatal hernia. I cannot access the actual report per se. Her last in the hospital were unremarkable except for an albumin of 2.8. She does have rheumatoid arthritis and was on methotrexate but it was stopped on discharge. She  smokes but is trying to cut down. A packable last 3 days. She also has well-controlled diabetes mellitus. She also has hypertension.  She denies any chest pressure. She denies any shunts breath at rest. She may get some dyspnea on exertion. No orthopnea. No paroxysmal internal dyspnea. No peripheral edema. No TIAs or amaurosis fugax. No history of blood clots. No prior abdominal surgery.  I reviewed her hospital discharge summary, the consultant notes from the hospital, CT scan from June 24, as well as her PCPs note from July 6   Problem List/Past Medical Leighton Ruff. 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)  This patient encounter took 39 minutes today to perform the following: take history, perform exam, review outside records, interpret imaging, counsel the patient on their diagnosis and document encounter, findings & plan in the EHR TOBACCO USE (Z72.0)   Past Surgical History Sabino Gasser, [place]; 02/02/2020 8:53 AM) No pertinent past surgical history   Diagnostic Studies History Sabino Gasser, [place]; 02/02/2020 8:53 AM) Colonoscopy  1-5 years ago Mammogram  within last year Pap Smear  1-5 years ago  Allergies Sabino Gasser, CMA; 02/02/2020 8:55 AM) Gabapentin *ANTICONVULSANTS*  Allergies Reconciled   Medication History Sabino Gasser, CMA; 02/02/2020 8:54 AM) Cetirizine HCl (10MG  Tablet, Oral) Active. Lovastatin (40MG  Tablet, Oral) Active. hydroCHLOROthiazide (25MG  Tablet, Oral) Active. cloNIDine HCl (0.1MG  Tablet, Oral) Active. Folic Acid (1MG  Tablet, Oral) Active. Omeprazole (20MG  Capsule DR, Oral) Active. Sertraline HCl (50MG  Tablet, Oral) Active. Benazepril HCl (5MG  Tablet, Oral) Active. Medications Reconciled  Social History Sabino Gasser, [place]; 02/02/2020 8:53 AM)  Alcohol use  Occasional alcohol use. Caffeine use  Coffee. No drug use  Tobacco use  Current every day  smoker.  Family History Sabino Gasser, [place]; 02/02/2020 8:53 AM) Arthritis  Father, Mother, Sister. Cerebrovascular Accident  Father. Colon Cancer  Mother. Heart Disease  Father. Hypertension  Father, Mother. Migraine Headache  Sister. Thyroid problems  Mother, Sister.  Pregnancy / Birth History Sabino Gasser, [place]; 02/02/2020 8:53 AM) Age at menarche  40 years. Age of menopause  18-55 Gravida  2 Maternal age  60-20 Para  2  Other Problems Jaime Hiss M. Redmond Pulling, MD; 02/02/2020 9:54 AM) Gastroesophageal Reflux Disease  Inguinal Hernia  High blood pressure  Arthritis  Diabetes Mellitus     Review of Systems Sabino Gasser CMA; 02/02/2020 8:53 AM) General Not Present- Appetite Loss, Chills, Fatigue, Fever, Night Sweats, Weight Gain and Weight Loss. Skin Not Present- Change in Wart/Mole, Dryness, Hives, Jaundice, New Lesions, Non-Healing Wounds, Rash and Ulcer. HEENT Not Present- Earache, Hearing Loss, Hoarseness, Nose Bleed, Oral Ulcers, Ringing in the Ears, Seasonal Allergies, Sinus Pain, Sore Throat, Visual Disturbances, Wears glasses/contact lenses and Yellow Eyes. Respiratory Not Present- Bloody sputum, Chronic Cough, Difficulty Breathing, Snoring and Wheezing. Breast Not Present- Breast Mass, Breast Pain, Nipple Discharge and Skin Changes. Cardiovascular Not Present- Chest Pain, Difficulty Breathing Lying Down, Leg Cramps, Palpitations, Rapid Heart Rate, Shortness of Breath and Swelling of Extremities. Gastrointestinal Not Present- Abdominal Pain, Bloating, Bloody Stool, Change in Bowel Habits, Chronic diarrhea, Constipation, Difficulty Swallowing, Excessive gas, Gets full quickly at meals, Hemorrhoids, Indigestion, Nausea, Rectal Pain and Vomiting. Female Genitourinary Not Present- Frequency, Nocturia, Painful Urination, Pelvic Pain and Urgency. Musculoskeletal Not Present- Back Pain, Joint Pain, Joint Stiffness, Muscle Pain, Muscle Weakness and Swelling of  Extremities. Neurological Not Present- Decreased Memory, Fainting, Headaches, Numbness, Seizures, Tingling, Tremor, Trouble walking and Weakness. Psychiatric Not Present- Anxiety, Bipolar, Change in Sleep Pattern, Depression, Fearful and Frequent crying. Endocrine Not Present- Cold Intolerance, Excessive Hunger, Hair Changes, Heat Intolerance, Hot flashes and New Diabetes. Hematology Not Present- Blood Thinners, Easy Bruising, Excessive bleeding, Gland problems, HIV and Persistent Infections.  Vitals Sabino Gasser CMA; 02/02/2020 8:55 AM) 02/02/2020 8:55 AM Weight: 131.2 lb Height: 59in Body Surface Area: 1.54 m Body Mass Index: 26.5 kg/m  Temp.: 98.14F (Tympanic)  Pulse: 102 (Regular)  BP: 132/80(Sitting, Left Arm, Standard)       Physical Exam Jaime Hiss M. Davied Nocito MD; 02/02/2020 9:47 AM) General Mental Status-Alert. General Appearance-Consistent with stated age. Hydration-Well hydrated. Voice-Normal.  Head and Neck Head-normocephalic, atraumatic with no lesions or palpable masses. Trachea-midline. Thyroid Gland Characteristics - normal size and consistency.  Eye Eyeball - Bilateral-Extraocular movements intact. Sclera/Conjunctiva - Bilateral-No scleral icterus.  Chest and Lung Exam Chest and lung exam reveals -quiet, even and easy respiratory effort with no use of accessory muscles and on auscultation, normal breath sounds, no adventitious sounds and normal vocal resonance. Inspection Chest Wall - Normal. Back - normal.  Breast - Did not examine.  Cardiovascular Cardiovascular examination reveals -normal heart sounds, regular rate and rhythm with no murmurs and normal pedal pulses bilaterally.  Abdomen Inspection Inspection of the abdomen reveals - No Hernias. Skin - Scar - no surgical scars. Palpation/Percussion Palpation and Percussion of the abdomen reveal - Soft, Non Tender, No Rebound tenderness, No Rigidity (guarding) and No  hepatosplenomegaly. Auscultation Auscultation of the abdomen reveals - Bowel sounds normal.  Peripheral Vascular Upper Extremity Palpation - Pulses bilaterally normal.  Neurologic Neurologic evaluation reveals -alert and oriented x 3 with no impairment of  recent or remote memory. Mental Status-Normal.  Neuropsychiatric The patient's mood and affect are described as -normal. Judgment and Insight-insight is appropriate concerning matters relevant to self.  Musculoskeletal Normal Exam - Left-Upper Extremity Strength Normal and Lower Extremity Strength Normal. Normal Exam - Right-Upper Extremity Strength Normal and Lower Extremity Strength Normal.  Lymphatic Head & Neck  General Head & Neck Lymphatics: Bilateral - Description - Normal. Axillary - Did not examine. Femoral & Inguinal - Did not examine.    Assessment & Plan Jaime Hiss M. 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 We discussed the anatomy. This needs to be repaired surgically. She was given Neurosurgeon. I would also like to get an upper GI prior to surgery. I will discuss pros and cons of manometry with her gastroenterologist. We formally discussed robotic repair of paraesophageal hernia with partial fundoplication and gastropexy with possible mesh. We discussed the steps of the procedure which would be reducing the stomach into the abdomen, closing the diaphragm with possible mesh depending on the condition of the diaphragm muscle along with the size of the defect. We discussed that we generally would  do at least a partial wrap. At don't think we would do a full wrap. We discussed the pros and cons of partial fundoplication versus full fundoplication. We will also perform a gastropexy. We discussed the typical hospitalization. We discussed the typical postoperative course. We discussed the diet progression. We discussed the risk of persistent heartburn. We discussed dysphagia after surgery. We discussed that there is fair risk of hernia recurrence. We discussed other risks and benefits including but not limited to bleeding, infection, injury to surrounding structures such as the aorta, lung, spleen, esophagus, stomach; blood clots, perioperative cardiac and pulmonary events, readmission. We discussed pain control after surgery and pain expectations. We discussed the use of the surgical robot. Discuss gas bloat and sometimes difficulty vomiting. Current Plans Pt Education - Pamphlet Given - Gastroesophagel Reflux Disease: discussed with patient and provided information. TOBACCO USE (Z72.0) Impression: We discussed that since the hiatal hernia has a fair risk of recurrence we need to do everything we can to minimize other issues that can increase healing issues such as tobacco use as well as potential medications. We discussed the importance of smoking cessation. She was given Neurosurgeon Current Plans Pt Education - Smoking: Ways to Quit: tobacco RHEUMATOID ARTHRITIS (M06.9) Impression: It would be ideal to have her stay off methotrexate for healing purposes but we will discuss her care with her rheumatologist and get her opinion about whether or not we can hold the methotrexate for the next 6 weeks or so DIET-CONTROLLED DIABETES MELLITUS (E11.9) ESSENTIAL HYPERTENSION (I10) Impression: Given her history of hypertension, diabetes mellitus, and tobacco use think having a preoperative cardiac assessment would be important. She will be referred for cardiac clearance.  Leighton Ruff. Redmond Pulling, MD,  FACS General, Bariatric, & Minimally Invasive Surgery [facility], [place]

## 2020-03-15 NOTE — Patient Instructions (Addendum)
DUE TO COVID-19 ONLY ONE VISITOR IS ALLOWED TO COME WITH YOU AND STAY IN THE WAITING ROOM ONLY DURING PRE OP AND PROCEDURE DAY OF SURGERY. THE 1 VISITOR  MAY VISIT WITH YOU AFTER SURGERY IN YOUR PRIVATE ROOM DURING VISITING HOURS ONLY!  YOU NEED TO HAVE A COVID 19 TEST ON: 03/16/20 @ 10:00 am, THIS TEST MUST BE DONE BEFORE SURGERY,  COVID TESTING SITE [place] [id number], IT IS ON THE RIGHT GOING OUT [address] APPROXIMATELY  2 MINUTES PAST [employer] ON THE RIGHT. ONCE YOUR COVID TEST IS COMPLETED,  PLEASE BEGIN THE QUARANTINE INSTRUCTIONS AS OUTLINED IN YOUR HANDOUT.                GENENE KILMAN    Your procedure is scheduled on: 03/20/20   Report to [facility] Main  Entrance   Report to admitting at: 8:00 AM     Call this number if you have problems the morning of surgery [phone]    Remember:   NO SOLID FOOD AFTER MIDNIGHT THE NIGHT PRIOR TO SURGERY. NOTHING BY MOUTH EXCEPT CLEAR LIQUIDS UNTIL: 7:00 am . PLEASE FINISH GATORADE DRINK PER SURGEON ORDER  WHICH NEEDS TO BE COMPLETED AT: 7:00 am .   CLEAR LIQUID DIET   Foods Allowed                                                                     Foods Excluded  Coffee and tea, regular and decaf                             liquids that you cannot  Plain Jell-O any favor except red or purple                                           see through such as: Fruit ices (not with fruit pulp)                                     milk, soups, orange juice  Iced Popsicles                                    All solid food Carbonated beverages, regular and diet                                    Cranberry, grape and apple juices Sports drinks like Gatorade Lightly seasoned clear broth or consume(fat free) Sugar, honey syrup  Sample Menu Breakfast                                Lunch  Supper Cranberry juice                    Beef broth                             Chicken broth Jell-O                                     Grape juice                           Apple juice Coffee or tea                        Jell-O                                      Popsicle                                                Coffee or tea                        Coffee or tea  _____________________________________________________________________   BRUSH YOUR TEETH MORNING OF SURGERY AND RINSE YOUR MOUTH OUT, NO CHEWING GUM CANDY OR MINTS.     Take these medicines the morning of surgery with A SIP OF WATER: cetirizine,sertraline,omeprazole.                               You may not have any metal on your body including hair pins and              piercings  Do not wear jewelry, make-up, lotions, powders or perfumes, deodorant             Do not wear nail polish on your fingernails.  Do not shave  48 hours prior to surgery.         Do not bring valuables to the hospital. [place].  Contacts, dentures or bridgework may not be worn into surgery.  Leave suitcase in the car. After surgery it may be brought to your room.     Patients discharged the day of surgery will not be allowed to drive home. IF YOU ARE HAVING SURGERY AND GOING HOME THE SAME DAY, YOU MUST HAVE AN ADULT TO DRIVE YOU HOME AND BE WITH YOU FOR 24 HOURS. YOU MAY GO HOME BY TAXI OR UBER OR ORTHERWISE, BUT AN ADULT MUST ACCOMPANY YOU HOME AND STAY WITH YOU FOR 24 HOURS.  Name and phone number of your driver:  Special Instructions: N/A              Please read over the following fact sheets you were given: _____________________________________________________________________          [facility] - Preparing for Surgery Before surgery, you can play an important role.  Because skin is not sterile, your skin needs to be as free  of germs as possible.  You can reduce the number of germs on your skin by washing with CHG (chlorahexidine gluconate) soap before  surgery.  CHG is an antiseptic cleaner which kills germs and bonds with the skin to continue killing germs even after washing. Please DO NOT use if you have an allergy to CHG or antibacterial soaps.  If your skin becomes reddened/irritated stop using the CHG and inform your nurse when you arrive at Short Stay. Do not shave (including legs and underarms) for at least 48 hours prior to the first CHG shower.  You may shave your face/neck. Please follow these instructions carefully:  1.  Shower with CHG Soap the night before surgery and the  morning of Surgery.  2.  If you choose to wash your hair, wash your hair first as usual with your  normal  shampoo.  3.  After you shampoo, rinse your hair and body thoroughly to remove the  shampoo.                           4.  Use CHG as you would any other liquid soap.  You can apply chg directly  to the skin and wash                       Gently with a scrungie or clean washcloth.  5.  Apply the CHG Soap to your body ONLY FROM THE NECK DOWN.   Do not use on face/ open                           Wound or open sores. Avoid contact with eyes, ears mouth and genitals (private parts).                       Wash face,  Genitals (private parts) with your normal soap.             6.  Wash thoroughly, paying special attention to the area where your surgery  will be performed.  7.  Thoroughly rinse your body with warm water from the neck down.  8.  DO NOT shower/wash with your normal soap after using and rinsing off  the CHG Soap.                9.  Pat yourself dry with a clean towel.            10.  Wear clean pajamas.            11.  Place clean sheets on your bed the night of your first shower and do not  sleep with pets. Day of Surgery : Do not apply any lotions/deodorants the morning of surgery.  Please wear clean clothes to the hospital/surgery center.  FAILURE TO FOLLOW THESE INSTRUCTIONS MAY RESULT IN THE CANCELLATION OF YOUR SURGERY PATIENT  SIGNATURE_________________________________  NURSE SIGNATURE__________________________________  ________________________________________________________________________

## 2020-03-16 ENCOUNTER — Other Ambulatory Visit (HOSPITAL_COMMUNITY)
Admission: RE | Admit: 2020-03-16 | Discharge: 2020-03-16 | Disposition: A | Discharge status: Home / Self Care | Source: Ambulatory Visit | Attending: General Surgery | Admitting: General Surgery

## 2020-03-16 ENCOUNTER — Other Ambulatory Visit: Payer: Self-pay

## 2020-03-16 ENCOUNTER — Encounter (HOSPITAL_COMMUNITY)
Admission: RE | Admit: 2020-03-16 | Discharge: 2020-03-16 | Disposition: A | Discharge status: Home / Self Care | Source: Ambulatory Visit | Attending: General Surgery | Admitting: General Surgery

## 2020-03-16 ENCOUNTER — Encounter (HOSPITAL_COMMUNITY): Payer: Self-pay

## 2020-03-16 DIAGNOSIS — Z20822 Contact with and (suspected) exposure to covid-19: Secondary | ICD-10-CM | POA: Diagnosis not present

## 2020-03-16 DIAGNOSIS — Z01812 Encounter for preprocedural laboratory examination: Secondary | ICD-10-CM | POA: Insufficient documentation

## 2020-03-16 HISTORY — DX: Unspecified osteoarthritis, unspecified site: M19.90

## 2020-03-16 LAB — COMPREHENSIVE METABOLIC PANEL
ALT: 24 U/L (ref 0–44)
AST: 25 U/L (ref 15–41)
Albumin: 4 g/dL (ref 3.5–5.0)
Alkaline Phosphatase: 61 U/L (ref 38–126)
Anion gap: 11 (ref 5–15)
BUN: 12 mg/dL (ref 8–23)
CO2: 23 mmol/L (ref 22–32)
Calcium: 9.4 mg/dL (ref 8.9–10.3)
Chloride: 106 mmol/L (ref 98–111)
Creatinine, Ser: 0.5 mg/dL (ref 0.44–1.00)
GFR calc Af Amer: >60 mL/min (ref >60)
GFR calc non Af Amer: >60 mL/min (ref >60)
Glucose, Bld: 128 mg/dL — ABNORMAL HIGH (ref 70–99)
Potassium: 3.3 mmol/L — ABNORMAL LOW (ref 3.5–5.1)
Sodium: 140 mmol/L (ref 135–145)
Total Bilirubin: 0.2 mg/dL — ABNORMAL LOW (ref 0.3–1.2)
Total Protein: 7.8 g/dL (ref 6.5–8.1)

## 2020-03-16 LAB — CBC WITH DIFFERENTIAL/PLATELET
Abs Immature Granulocytes: 0.01 10*3/uL (ref 0.00–0.07)
Basophils Absolute: 0 10*3/uL (ref 0.0–0.1)
Basophils Relative: 1 %
Eosinophils Absolute: 0.1 10*3/uL (ref 0.0–0.5)
Eosinophils Relative: 3 %
HCT: 41.2 % (ref 36.0–46.0)
Hemoglobin: 13.6 g/dL (ref 12.0–15.0)
Immature Granulocytes: 0 %
Lymphocytes Relative: 30 %
Lymphs Abs: 1.6 10*3/uL (ref 0.7–4.0)
MCH: 28.7 pg (ref 26.0–34.0)
MCHC: 33 g/dL (ref 30.0–36.0)
MCV: 86.9 fL (ref 80.0–100.0)
Monocytes Absolute: 0.4 10*3/uL (ref 0.1–1.0)
Monocytes Relative: 7 %
Neutro Abs: 3.3 10*3/uL (ref 1.7–7.7)
Neutrophils Relative %: 59 %
Platelets: 219 10*3/uL (ref 150–400)
RBC: 4.74 MIL/uL (ref 3.87–5.11)
RDW: 14.6 % (ref 11.5–15.5)
WBC: 5.4 10*3/uL (ref 4.0–10.5)
nRBC: 0 % (ref 0.0–0.2)

## 2020-03-16 LAB — GLUCOSE, CAPILLARY: Glucose-Capillary: 136 mg/dL — ABNORMAL HIGH (ref 70–99)

## 2020-03-16 LAB — HEMOGLOBIN A1C
Hgb A1c MFr Bld: 6 % — ABNORMAL HIGH (ref 4.8–5.6)
Mean Plasma Glucose: 125.5 mg/dL

## 2020-03-16 LAB — SARS CORONAVIRUS 2 (TAT 6-24 HRS): SARS Coronavirus 2: NEGATIVE

## 2020-03-16 NOTE — Progress Notes (Addendum)
COVID Vaccine Completed:Yes Date COVID Vaccine completed: 10/01/19 COVID vaccine manufacturer: Pfizer    *[employer]   PCP - Dr. Tula Nakayama. LOV: 01/25/20 Cardiologist - Dr. Dorris Carnes. LOV: 02/18/20: Clearance. EPIC  Chest x-ray - CT chest: 02/09/20 EKG - 01/13/20. EPIC Stress Test -  ECHO -  Cardiac Cath -   Sleep Study -  CPAP -   Fasting Blood Sugar -  Checks Blood Sugar _____ times a day  Blood Thinner Instructions: Aspirin Instructions: Last Dose:  Anesthesia review: Hx: DIA,HTN,chest pain(no cardiac related as per cardiologist).  Patient denies shortness of breath, fever, cough and chest pain at PAT appointment   Patient verbalized understanding of instructions that were given to them at the PAT appointment. Patient was also instructed that they will need to review over the PAT instructions again at home before surgery.

## 2020-03-19 MED ORDER — BUPIVACAINE LIPOSOME 1.3 % IJ SUSP
20.0000 mL | Freq: Once | INTRAMUSCULAR | Status: DC
  Filled 2020-03-19: qty 20

## 2020-03-20 ENCOUNTER — Inpatient Hospital Stay (HOSPITAL_COMMUNITY): Admitting: Certified Registered Nurse Anesthetist

## 2020-03-20 ENCOUNTER — Inpatient Hospital Stay (HOSPITAL_COMMUNITY)
Admission: RE | Admit: 2020-03-20 | Discharge: 2020-03-21 | DRG: 328 | Disposition: A | Discharge status: Home / Self Care | Attending: General Surgery | Admitting: General Surgery

## 2020-03-20 ENCOUNTER — Other Ambulatory Visit: Payer: Self-pay | Admitting: Family Medicine

## 2020-03-20 ENCOUNTER — Encounter (HOSPITAL_COMMUNITY)
Admission: RE | Disposition: A | Discharge status: Home / Self Care | Payer: Self-pay | Source: Home / Self Care | Attending: General Surgery

## 2020-03-20 ENCOUNTER — Other Ambulatory Visit: Payer: Self-pay

## 2020-03-20 ENCOUNTER — Encounter (HOSPITAL_COMMUNITY): Payer: Self-pay | Admitting: General Surgery

## 2020-03-20 DIAGNOSIS — Z8261 Family history of arthritis: Secondary | ICD-10-CM | POA: Diagnosis not present

## 2020-03-20 DIAGNOSIS — F17200 Nicotine dependence, unspecified, uncomplicated: Secondary | ICD-10-CM | POA: Diagnosis present

## 2020-03-20 DIAGNOSIS — Z800 Family history of malignant neoplasm of digestive organs: Secondary | ICD-10-CM

## 2020-03-20 DIAGNOSIS — G43909 Migraine, unspecified, not intractable, without status migrainosus: Secondary | ICD-10-CM | POA: Diagnosis present

## 2020-03-20 DIAGNOSIS — M069 Rheumatoid arthritis, unspecified: Secondary | ICD-10-CM | POA: Diagnosis present

## 2020-03-20 DIAGNOSIS — K3189 Other diseases of stomach and duodenum: Secondary | ICD-10-CM | POA: Diagnosis present

## 2020-03-20 DIAGNOSIS — K449 Diaphragmatic hernia without obstruction or gangrene: Secondary | ICD-10-CM | POA: Diagnosis present

## 2020-03-20 DIAGNOSIS — Z8249 Family history of ischemic heart disease and other diseases of the circulatory system: Secondary | ICD-10-CM | POA: Diagnosis not present

## 2020-03-20 DIAGNOSIS — E119 Type 2 diabetes mellitus without complications: Secondary | ICD-10-CM | POA: Diagnosis present

## 2020-03-20 DIAGNOSIS — Z20822 Contact with and (suspected) exposure to covid-19: Secondary | ICD-10-CM | POA: Diagnosis present

## 2020-03-20 DIAGNOSIS — Z8719 Personal history of other diseases of the digestive system: Secondary | ICD-10-CM

## 2020-03-20 DIAGNOSIS — K219 Gastro-esophageal reflux disease without esophagitis: Secondary | ICD-10-CM | POA: Diagnosis present

## 2020-03-20 DIAGNOSIS — I10 Essential (primary) hypertension: Secondary | ICD-10-CM | POA: Diagnosis present

## 2020-03-20 DIAGNOSIS — M1990 Unspecified osteoarthritis, unspecified site: Secondary | ICD-10-CM | POA: Diagnosis present

## 2020-03-20 DIAGNOSIS — K4090 Unilateral inguinal hernia, without obstruction or gangrene, not specified as recurrent: Secondary | ICD-10-CM | POA: Diagnosis present

## 2020-03-20 DIAGNOSIS — Z823 Family history of stroke: Secondary | ICD-10-CM

## 2020-03-20 HISTORY — PX: ESOPHAGOGASTRODUODENOSCOPY: SHX5428

## 2020-03-20 HISTORY — PX: XI ROBOTIC ASSISTED PARAESOPHAGEAL HERNIA REPAIR: SHX6871

## 2020-03-20 LAB — TYPE AND SCREEN
ABO/RH(D): O NEG
Antibody Screen: NEGATIVE

## 2020-03-20 LAB — GLUCOSE, CAPILLARY
Glucose-Capillary: 104 mg/dL — ABNORMAL HIGH (ref 70–99)
Glucose-Capillary: 165 mg/dL — ABNORMAL HIGH (ref 70–99)
Glucose-Capillary: 136 mg/dL — ABNORMAL HIGH (ref 70–99)

## 2020-03-20 SURGERY — REPAIR, HERNIA, PARAESOPHAGEAL, ROBOT-ASSISTED
Anesthesia: General

## 2020-03-20 MED ORDER — SERTRALINE HCL 50 MG PO TABS
50.0000 mg | ORAL_TABLET | Freq: Every day | ORAL | Status: DC
  Administered 2020-03-21: 09:00:00 50 mg via ORAL
  Filled 2020-03-20: qty 1

## 2020-03-20 MED ORDER — PROMETHAZINE HCL 25 MG/ML IJ SOLN: 12.5000 mg | Freq: Four times a day (QID) | INTRAMUSCULAR | Status: DC | PRN

## 2020-03-20 MED ORDER — HEPARIN SODIUM (PORCINE) 5000 UNIT/ML IJ SOLN
5000.0000 [IU] | Freq: Once | INTRAMUSCULAR | Status: AC
  Administered 2020-03-20: 5000 [IU] via SUBCUTANEOUS
  Filled 2020-03-20: qty 1

## 2020-03-20 MED ORDER — FENTANYL CITRATE (PF) 250 MCG/5ML IJ SOLN
INTRAMUSCULAR | Status: AC
  Filled 2020-03-20: qty 5

## 2020-03-20 MED ORDER — PHENYLEPHRINE HCL-NACL 10-0.9 MG/250ML-% IV SOLN
INTRAVENOUS | Status: DC | PRN
  Administered 2020-03-20: 20 ug/min via INTRAVENOUS

## 2020-03-20 MED ORDER — CHLORHEXIDINE GLUCONATE CLOTH 2 % EX PADS: 6.0000 | MEDICATED_PAD | Freq: Once | CUTANEOUS | Status: DC

## 2020-03-20 MED ORDER — MORPHINE SULFATE (PF) 4 MG/ML IV SOLN: 1.0000 mg | INTRAVENOUS | Status: DC | PRN

## 2020-03-20 MED ORDER — DEXAMETHASONE SODIUM PHOSPHATE 4 MG/ML IJ SOLN: 4.0000 mg | INTRAMUSCULAR | Status: DC

## 2020-03-20 MED ORDER — PHENYLEPHRINE 40 MCG/ML (10ML) SYRINGE FOR IV PUSH (FOR BLOOD PRESSURE SUPPORT)
PREFILLED_SYRINGE | INTRAVENOUS | Status: DC | PRN
  Administered 2020-03-20: 120 ug via INTRAVENOUS
  Administered 2020-03-20 (×2): 40 ug via INTRAVENOUS
  Administered 2020-03-20 (×2): 80 ug via INTRAVENOUS
  Administered 2020-03-20: 120 ug via INTRAVENOUS
  Administered 2020-03-20: 40 ug via INTRAVENOUS
  Administered 2020-03-20 (×2): 80 ug via INTRAVENOUS
  Administered 2020-03-20: 40 ug via INTRAVENOUS
  Administered 2020-03-20: 80 ug via INTRAVENOUS

## 2020-03-20 MED ORDER — MIDAZOLAM HCL 5 MG/5ML IJ SOLN
INTRAMUSCULAR | Status: DC | PRN
  Administered 2020-03-20: 2 mg via INTRAVENOUS

## 2020-03-20 MED ORDER — SIMETHICONE 80 MG PO CHEW: 40.0000 mg | CHEWABLE_TABLET | Freq: Four times a day (QID) | ORAL | Status: DC | PRN

## 2020-03-20 MED ORDER — DEXAMETHASONE SODIUM PHOSPHATE 4 MG/ML IJ SOLN
INTRAMUSCULAR | Status: DC | PRN
  Administered 2020-03-20: 8 mg via INTRAVENOUS

## 2020-03-20 MED ORDER — HYDROCHLOROTHIAZIDE 25 MG PO TABS
25.0000 mg | ORAL_TABLET | Freq: Every day | ORAL | Status: DC
  Administered 2020-03-21: 09:00:00 25 mg via ORAL
  Filled 2020-03-20: qty 1

## 2020-03-20 MED ORDER — ACETAMINOPHEN 500 MG PO TABS
1000.0000 mg | ORAL_TABLET | Freq: Four times a day (QID) | ORAL | Status: DC
  Administered 2020-03-20 – 2020-03-21 (×3): 1000 mg via ORAL
  Filled 2020-03-20 (×3): qty 2

## 2020-03-20 MED ORDER — CLONIDINE HCL 0.1 MG PO TABS
0.1000 mg | ORAL_TABLET | Freq: Every day | ORAL | Status: DC
  Administered 2020-03-20: 0.1 mg via ORAL
  Filled 2020-03-20: qty 1

## 2020-03-20 MED ORDER — DIPHENHYDRAMINE HCL 50 MG/ML IJ SOLN
12.5000 mg | Freq: Four times a day (QID) | INTRAMUSCULAR | Status: DC | PRN
  Administered 2020-03-20: 21:00:00 12.5 mg via INTRAVENOUS
  Filled 2020-03-20: qty 1

## 2020-03-20 MED ORDER — KETOROLAC TROMETHAMINE 15 MG/ML IJ SOLN
15.0000 mg | Freq: Three times a day (TID) | INTRAMUSCULAR | Status: DC
  Administered 2020-03-20 – 2020-03-21 (×2): 15 mg via INTRAVENOUS
  Filled 2020-03-20 (×2): qty 1

## 2020-03-20 MED ORDER — ZOLPIDEM TARTRATE 5 MG PO TABS: 5.0000 mg | ORAL_TABLET | Freq: Every evening | ORAL | Status: DC | PRN

## 2020-03-20 MED ORDER — LIDOCAINE 2% (20 MG/ML) 5 ML SYRINGE
INTRAMUSCULAR | Status: AC
  Filled 2020-03-20: qty 5

## 2020-03-20 MED ORDER — ONDANSETRON HCL 4 MG/2ML IJ SOLN
INTRAMUSCULAR | Status: AC
  Filled 2020-03-20: qty 2

## 2020-03-20 MED ORDER — PHENYLEPHRINE 40 MCG/ML (10ML) SYRINGE FOR IV PUSH (FOR BLOOD PRESSURE SUPPORT)
PREFILLED_SYRINGE | INTRAVENOUS | Status: AC
  Filled 2020-03-20: qty 10

## 2020-03-20 MED ORDER — LACTATED RINGERS IR SOLN
Status: DC | PRN
  Administered 2020-03-20: 1000 mL

## 2020-03-20 MED ORDER — ROCURONIUM BROMIDE 10 MG/ML (PF) SYRINGE
PREFILLED_SYRINGE | INTRAVENOUS | Status: DC | PRN
  Administered 2020-03-20: 20 mg via INTRAVENOUS
  Administered 2020-03-20: 10 mg via INTRAVENOUS
  Administered 2020-03-20: 30 mg via INTRAVENOUS
  Administered 2020-03-20: 20 mg via INTRAVENOUS
  Administered 2020-03-20: 50 mg via INTRAVENOUS
  Administered 2020-03-20: 20 mg via INTRAVENOUS

## 2020-03-20 MED ORDER — PANTOPRAZOLE SODIUM 40 MG IV SOLR
40.0000 mg | Freq: Every day | INTRAVENOUS | Status: DC
  Administered 2020-03-20: 21:00:00 40 mg via INTRAVENOUS
  Filled 2020-03-20: qty 40

## 2020-03-20 MED ORDER — ENOXAPARIN SODIUM 40 MG/0.4ML ~~LOC~~ SOLN
40.0000 mg | SUBCUTANEOUS | Status: DC
  Administered 2020-03-21: 08:00:00 40 mg via SUBCUTANEOUS
  Filled 2020-03-20: qty 0.4

## 2020-03-20 MED ORDER — SUGAMMADEX SODIUM 200 MG/2ML IV SOLN
INTRAVENOUS | Status: DC | PRN
  Administered 2020-03-20: 200 mg via INTRAVENOUS

## 2020-03-20 MED ORDER — DIPHENHYDRAMINE HCL 12.5 MG/5ML PO ELIX: 12.5000 mg | ORAL_SOLUTION | Freq: Four times a day (QID) | ORAL | Status: DC | PRN

## 2020-03-20 MED ORDER — ONDANSETRON HCL 4 MG/2ML IJ SOLN
4.0000 mg | Freq: Four times a day (QID) | INTRAMUSCULAR | Status: DC | PRN
  Administered 2020-03-21: 4 mg via INTRAVENOUS
  Filled 2020-03-20: qty 2

## 2020-03-20 MED ORDER — LIDOCAINE HCL 2 % IJ SOLN
INTRAMUSCULAR | Status: AC
  Filled 2020-03-20: qty 20

## 2020-03-20 MED ORDER — ONDANSETRON 4 MG PO TBDP: 4.0000 mg | ORAL_TABLET | Freq: Four times a day (QID) | ORAL | Status: DC | PRN

## 2020-03-20 MED ORDER — LIDOCAINE 2% (20 MG/ML) 5 ML SYRINGE
INTRAMUSCULAR | Status: DC | PRN
  Administered 2020-03-20: 80 mg via INTRAVENOUS

## 2020-03-20 MED ORDER — ONDANSETRON HCL 4 MG/2ML IJ SOLN
INTRAMUSCULAR | Status: DC | PRN
  Administered 2020-03-20: 4 mg via INTRAVENOUS

## 2020-03-20 MED ORDER — PROPOFOL 10 MG/ML IV BOLUS
INTRAVENOUS | Status: AC
  Filled 2020-03-20: qty 20

## 2020-03-20 MED ORDER — CEFAZOLIN SODIUM-DEXTROSE 2-4 GM/100ML-% IV SOLN
2.0000 g | INTRAVENOUS | Status: AC
  Administered 2020-03-20: 2 g via INTRAVENOUS
  Filled 2020-03-20: qty 100

## 2020-03-20 MED ORDER — BUPIVACAINE-EPINEPHRINE (PF) 0.25% -1:200000 IJ SOLN
INTRAMUSCULAR | Status: AC
  Filled 2020-03-20: qty 30

## 2020-03-20 MED ORDER — CHLORHEXIDINE GLUCONATE 0.12 % MT SOLN
15.0000 mL | Freq: Once | OROMUCOSAL | Status: AC
  Administered 2020-03-20: 15 mL via OROMUCOSAL

## 2020-03-20 MED ORDER — BENAZEPRIL HCL 5 MG PO TABS
5.0000 mg | ORAL_TABLET | Freq: Every day | ORAL | Status: DC
  Administered 2020-03-21: 09:00:00 5 mg via ORAL
  Filled 2020-03-20: qty 1

## 2020-03-20 MED ORDER — LIDOCAINE 2% (20 MG/ML) 5 ML SYRINGE
INTRAMUSCULAR | Status: DC | PRN
  Administered 2020-03-20: 1.5 mg/kg/h via INTRAVENOUS

## 2020-03-20 MED ORDER — ACETAMINOPHEN 500 MG PO TABS
1000.0000 mg | ORAL_TABLET | ORAL | Status: AC
  Administered 2020-03-20: 1000 mg via ORAL
  Filled 2020-03-20: qty 2

## 2020-03-20 MED ORDER — ORAL CARE MOUTH RINSE: 15.0000 mL | Freq: Once | OROMUCOSAL | Status: AC

## 2020-03-20 MED ORDER — PHENYLEPHRINE HCL (PRESSORS) 10 MG/ML IV SOLN
INTRAVENOUS | Status: AC
  Filled 2020-03-20: qty 1

## 2020-03-20 MED ORDER — SCOPOLAMINE 1 MG/3DAYS TD PT72
1.0000 | MEDICATED_PATCH | TRANSDERMAL | Status: DC
  Administered 2020-03-20: 1.5 mg via TRANSDERMAL
  Filled 2020-03-20: qty 1

## 2020-03-20 MED ORDER — ROCURONIUM BROMIDE 10 MG/ML (PF) SYRINGE
PREFILLED_SYRINGE | INTRAVENOUS | Status: AC
  Filled 2020-03-20: qty 10

## 2020-03-20 MED ORDER — POLYVINYL ALCOHOL 1.4 % OP SOLN
1.0000 [drp] | Freq: Three times a day (TID) | OPHTHALMIC | Status: DC | PRN
  Filled 2020-03-20: qty 15

## 2020-03-20 MED ORDER — OXYCODONE HCL 5 MG/5ML PO SOLN
5.0000 mg | ORAL | Status: DC | PRN
  Administered 2020-03-20 (×2): 5 mg via ORAL
  Filled 2020-03-20 (×2): qty 5

## 2020-03-20 MED ORDER — LACTATED RINGERS IV SOLN: INTRAVENOUS | Status: DC

## 2020-03-20 MED ORDER — DEXAMETHASONE SODIUM PHOSPHATE 10 MG/ML IJ SOLN
INTRAMUSCULAR | Status: AC
  Filled 2020-03-20: qty 1

## 2020-03-20 MED ORDER — MIDAZOLAM HCL 2 MG/2ML IJ SOLN
INTRAMUSCULAR | Status: AC
  Filled 2020-03-20: qty 2

## 2020-03-20 MED ORDER — KETAMINE HCL 10 MG/ML IJ SOLN
INTRAMUSCULAR | Status: DC | PRN
  Administered 2020-03-20: 30 mg via INTRAVENOUS

## 2020-03-20 MED ORDER — FENTANYL CITRATE (PF) 100 MCG/2ML IJ SOLN: 25.0000 ug | INTRAMUSCULAR | Status: DC | PRN

## 2020-03-20 MED ORDER — PROPOFOL 10 MG/ML IV BOLUS
INTRAVENOUS | Status: DC | PRN
  Administered 2020-03-20: 130 mg via INTRAVENOUS

## 2020-03-20 MED ORDER — FENTANYL CITRATE (PF) 100 MCG/2ML IJ SOLN
INTRAMUSCULAR | Status: DC | PRN
Start: 2020-03-20 — End: 2020-03-20
  Administered 2020-03-20 (×2): 50 ug via INTRAVENOUS
  Administered 2020-03-20: 100 ug via INTRAVENOUS
  Administered 2020-03-20: 50 ug via INTRAVENOUS

## 2020-03-20 MED ORDER — BUPIVACAINE LIPOSOME 1.3 % IJ SUSP
INTRAMUSCULAR | Status: DC | PRN
  Administered 2020-03-20: 20 mL

## 2020-03-20 MED ORDER — KETOROLAC TROMETHAMINE 15 MG/ML IJ SOLN
INTRAMUSCULAR | Status: DC | PRN
  Administered 2020-03-20: 15 mg via INTRAVENOUS

## 2020-03-20 MED ORDER — BUPIVACAINE-EPINEPHRINE 0.25% -1:200000 IJ SOLN
INTRAMUSCULAR | Status: DC | PRN
  Administered 2020-03-20: 30 mL

## 2020-03-20 MED ORDER — KCL IN DEXTROSE-NACL 20-5-0.45 MEQ/L-%-% IV SOLN
INTRAVENOUS | Status: DC
  Filled 2020-03-20 (×2): qty 1000

## 2020-03-20 SURGICAL SUPPLY — 65 items
APPLIER CLIP 5 13 M/L LIGAMAX5 (MISCELLANEOUS)
APPLIER CLIP ROT 10 11.4 M/L (STAPLE)
BLADE SURG SZ11 CARB STEEL (BLADE) ×3 IMPLANT
CHLORAPREP W/TINT 26 (MISCELLANEOUS) ×3 IMPLANT
CLIP APPLIE 5 13 M/L LIGAMAX5 (MISCELLANEOUS) IMPLANT
CLIP APPLIE ROT 10 11.4 M/L (STAPLE) IMPLANT
CLOSURE STERI-STRIP 1/2X4 (GAUZE/BANDAGES/DRESSINGS) ×1
CLSR STERI-STRIP ANTIMIC 1/2X4 (GAUZE/BANDAGES/DRESSINGS) ×2 IMPLANT
COVER SURGICAL LIGHT HANDLE (MISCELLANEOUS) ×3 IMPLANT
COVER TIP SHEARS 8 DVNC (MISCELLANEOUS) IMPLANT
COVER TIP SHEARS 8MM DA VINCI (MISCELLANEOUS)
COVER WAND RF STERILE (DRAPES) ×3 IMPLANT
DECANTER SPIKE VIAL GLASS SM (MISCELLANEOUS) ×3 IMPLANT
DERMABOND ADVANCED (GAUZE/BANDAGES/DRESSINGS) ×2
DERMABOND ADVANCED .7 DNX12 (GAUZE/BANDAGES/DRESSINGS) ×1 IMPLANT
DRAIN PENROSE 0.5X18 (DRAIN) IMPLANT
DRAPE ARM DVNC X/XI (DISPOSABLE) ×4 IMPLANT
DRAPE COLUMN DVNC XI (DISPOSABLE) ×1 IMPLANT
DRAPE DA VINCI XI ARM (DISPOSABLE) ×12
DRAPE DA VINCI XI COLUMN (DISPOSABLE) ×3
DRSG TEGADERM 4X4.75 (GAUZE/BANDAGES/DRESSINGS) ×3 IMPLANT
ELECT REM PT RETURN 15FT ADLT (MISCELLANEOUS) ×3 IMPLANT
ENDOLOOP SUT PDS II  0 18 (SUTURE)
ENDOLOOP SUT PDS II 0 18 (SUTURE) IMPLANT
GAUZE 4X4 16PLY RFD (DISPOSABLE) ×3 IMPLANT
GAUZE SPONGE 2X2 8PLY STRL LF (GAUZE/BANDAGES/DRESSINGS) ×1 IMPLANT
GLOVE BIO SURGEON STRL SZ 6 (GLOVE) ×9 IMPLANT
GLOVE INDICATOR 6.5 STRL GRN (GLOVE) ×9 IMPLANT
GOWN STRL REUS W/TWL LRG LVL3 (GOWN DISPOSABLE) ×12 IMPLANT
GOWN STRL REUS W/TWL XL LVL3 (GOWN DISPOSABLE) IMPLANT
KIT BASIN OR (CUSTOM PROCEDURE TRAY) ×3 IMPLANT
KIT TURNOVER KIT A (KITS) IMPLANT
LUBRICANT JELLY K Y 4OZ (MISCELLANEOUS) IMPLANT
MARKER SKIN DUAL TIP RULER LAB (MISCELLANEOUS) ×3 IMPLANT
MESH BIO-A 7X10 SYN MAT (Mesh General) ×3 IMPLANT
NEEDLE HYPO 22GX1.5 SAFETY (NEEDLE) ×3 IMPLANT
NEEDLE INSUFFLATION 14GA 120MM (NEEDLE) ×3 IMPLANT
PACK CARDIOVASCULAR III (CUSTOM PROCEDURE TRAY) ×3 IMPLANT
PAD POSITIONING PINK XL (MISCELLANEOUS) ×3 IMPLANT
SCISSORS LAP 5X35 DISP (ENDOMECHANICALS) ×3 IMPLANT
SEAL CANN UNIV 5-8 DVNC XI (MISCELLANEOUS) ×4 IMPLANT
SEAL XI 5MM-8MM UNIVERSAL (MISCELLANEOUS) ×12
SEALER VESSEL DA VINCI XI (MISCELLANEOUS) ×3
SEALER VESSEL EXT DVNC XI (MISCELLANEOUS) ×1 IMPLANT
SET IRRIG TUBING LAPAROSCOPIC (IRRIGATION / IRRIGATOR) ×3 IMPLANT
SOL ANTI FOG 6CC (MISCELLANEOUS) ×1 IMPLANT
SOLUTION ANTI FOG 6CC (MISCELLANEOUS) ×2
SOLUTION ELECTROLUBE (MISCELLANEOUS) ×3 IMPLANT
SPONGE GAUZE 2X2 STER 10/PKG (GAUZE/BANDAGES/DRESSINGS) ×2
SUT ETHIBOND 0 36 GRN (SUTURE) ×9 IMPLANT
SUT MNCRL AB 4-0 PS2 18 (SUTURE) ×3 IMPLANT
SUT SILK 0 SH 30 (SUTURE) ×6 IMPLANT
SUT SILK 2 0 SH (SUTURE) ×3 IMPLANT
SUT VICRYL 0 UR6 27IN ABS (SUTURE) ×3 IMPLANT
SYR 20ML LL LF (SYRINGE) ×3 IMPLANT
TIP INNERVISION DETACH 40FR (MISCELLANEOUS) IMPLANT
TIP INNERVISION DETACH 50FR (MISCELLANEOUS) IMPLANT
TIP INNERVISION DETACH 56FR (MISCELLANEOUS) ×3 IMPLANT
TIPS INNERVISION DETACH 40FR (MISCELLANEOUS)
TOWEL OR 17X26 10 PK STRL BLUE (TOWEL DISPOSABLE) ×3 IMPLANT
TOWEL OR NON WOVEN STRL DISP B (DISPOSABLE) ×3 IMPLANT
TRAY FOLEY MTR SLVR 16FR STAT (SET/KITS/TRAYS/PACK) IMPLANT
TROCAR ADV FIXATION 5X100MM (TROCAR) ×3 IMPLANT
TROCAR XCEL 12X100 BLDLESS (ENDOMECHANICALS) ×3 IMPLANT
TUBING INSUFFLATION 10FT LAP (TUBING) ×3 IMPLANT

## 2020-03-20 NOTE — Anesthesia Preprocedure Evaluation (Signed)
Anesthesia Evaluation  Patient identified by MRN, date of birth, ID band Patient awake    Reviewed: Allergy & Precautions, NPO status , Patient's Chart, lab work & pertinent test results  Airway Mallampati: II  TM Distance: >3 FB     Dental   Pulmonary Current Smoker and Patient abstained from smoking.,    breath sounds clear to auscultation       Cardiovascular hypertension,  Rhythm:Regular Rate:Normal     Neuro/Psych Anxiety Depression    GI/Hepatic Neg liver ROS, GERD  ,History noted CG   Endo/Other  diabetes  Renal/GU      Musculoskeletal   Abdominal   Peds  Hematology  (+) anemia ,   Anesthesia Other Findings   Reproductive/Obstetrics                             Anesthesia Physical Anesthesia Plan  ASA: III  Anesthesia Plan: General   Post-op Pain Management:    Induction: Intravenous  PONV Risk Score and Plan: 2 and Ondansetron and Dexamethasone  Airway Management Planned: Oral ETT  Additional Equipment:   Intra-op Plan:   Post-operative Plan: Possible Post-op intubation/ventilation  Informed Consent: I have reviewed the patients History and Physical, chart, labs and discussed the procedure including the risks, benefits and alternatives for the proposed anesthesia with the patient or authorized representative who has indicated his/her understanding and acceptance.     Dental advisory given  Plan Discussed with: CRNA and Anesthesiologist  Anesthesia Plan Comments:         Anesthesia Quick Evaluation

## 2020-03-20 NOTE — Anesthesia Procedure Notes (Signed)
Procedure Name: Intubation Date/Time: 03/20/2020 9:42 AM Performed by: Deliah Boston, CRNA Pre-anesthesia Checklist: Patient identified, Emergency Drugs available, Suction available and Patient being monitored Patient Re-evaluated:Patient Re-evaluated prior to induction Oxygen Delivery Method: Circle system utilized Preoxygenation: Pre-oxygenation with 100% oxygen Induction Type: IV induction Ventilation: Mask ventilation without difficulty Laryngoscope Size: Mac and 3 Grade View: Grade I Tube type: Oral Tube size: 7.0 mm Number of attempts: 1 Airway Equipment and Method: Stylet and Oral airway Placement Confirmation: ETT inserted through vocal cords under direct vision,  positive ETCO2 and breath sounds checked- equal and bilateral Secured at: 22 cm Tube secured with: Tape Dental Injury: Teeth and Oropharynx as per pre-operative assessment

## 2020-03-20 NOTE — Interval H&P Note (Signed)
History and Physical Interval Note:  03/20/2020 9:20 AM  Jaime Murray Form  has presented today for surgery, with the diagnosis of LARGE PARAESOPHAGEAL HIATAL HERNIA HISTORY OF GASTRIC VOLVULUS.  The various methods of treatment have been discussed with the patient and family. After consideration of risks, benefits and other options for treatment, the patient has consented to  Procedure(s): ROBOTIC REPAIR OF LARGE PARAESOPHAGEAL HIATAL HERNIA WITH POSSIBLE MESH  GASTROPEXY (N/A) POSSIBLE ESOPHAGOGASTRODUODENOSCOPY (EGD) (N/A) as a surgical intervention.  The patient's history has been reviewed, patient examined, no change in status, stable for surgery.  I have reviewed the patient's chart and labs.  Questions were answered to the patient's satisfaction.    With partial fundoplication as well  Leighton Ruff. Redmond Pulling, MD, FACS General, Bariatric, & Minimally Invasive Surgery [facility]  [name]

## 2020-03-20 NOTE — Transfer of Care (Signed)
Immediate Anesthesia Transfer of Care Note  Patient: Jaime Murray  Procedure(s) Performed: Procedure(s): ROBOTIC REPAIR OF LARGE PARAESOPHAGEAL HIATAL HERNIA WITH MESH AND GASTROPEXY AND PARTIAL FUNDOPLICATION (N/A) ESOPHAGOGASTRODUODENOSCOPY (EGD) (N/A)  Patient Location: PACU  Anesthesia Type:General  Level of Consciousness: Patient easily awoken, sedated, comfortable, cooperative, following commands, responds to stimulation.   Airway & Oxygen Therapy: Patient spontaneously breathing, ventilating well, oxygen via simple oxygen mask.  Post-op Assessment: Report given to PACU RN, vital signs reviewed and stable, moving all extremities.   Post vital signs: Reviewed and stable.  Complications: No apparent anesthesia complications  Last Vitals:  Vitals Value Taken Time  BP 134/76 03/20/20 1430  Temp    Pulse 96 03/20/20 1432  Resp 14 03/20/20 1432  SpO2 97 % 03/20/20 1432  Vitals shown include unvalidated device data.  Last Pain:  Vitals:   03/20/20 0808  TempSrc: Oral      Patients Stated Pain Goal: 4 (46/50/35 4656)  Complications: No complications documented.

## 2020-03-20 NOTE — Plan of Care (Signed)

## 2020-03-20 NOTE — Brief Op Note (Signed)
03/20/2020  2:42 PM  PATIENT:  Jaime Murray Form  64 y.o. 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  PROCEDURE:  Procedure(s): ROBOTIC REPAIR OF LARGE TYPE III PARAESOPHAGEAL HIATAL HERNIA WITH MESH AND GASTROPEXY AND PARTIAL (TOUPET) FUNDOPLICATION (N/A) ESOPHAGOGASTRODUODENOSCOPY (EGD) (N/A) LAPAROSCOPIC BILATERAL TAP BLOCK  SURGEON:  Surgeon(s) and Role:    [name], MD - Primary    * Clovis Riley, MD - Assistant Surgeon  PHYSICIAN ASSISTANT:   ASSISTANTS: Romana Juniper MD FACS   ANESTHESIA:   general  EBL:  20 mL   BLOOD ADMINISTERED:none  DRAINS: none   LOCAL MEDICATIONS USED:  MARCAINE    and OTHER Exparel  SPECIMEN:  Source of Specimen:  hernia sac  DISPOSITION OF SPECIMEN:  discarded  COUNTS:  YES  TOURNIQUET:  * No tourniquets in log *  DICTATION: Viviann Spare Dictation  [phone] PLAN OF CARE: Admit to inpatient   PATIENT DISPOSITION:  PACU - hemodynamically stable.   Delay start of Pharmacological VTE agent (>24hrs) due to surgical blood loss or risk of bleeding: no

## 2020-03-20 NOTE — Op Note (Signed)
NAME: Jaime Murray, Jaime Murray MEDICAL RECORD [id number] ACCOUNT [id number] DATE OF BIRTH:[birth year] FACILITY: [facility] LOCATION: [facility] PHYSICIAN:Chinedu Agustin Ronnie Derby, MD  OPERATIVE REPORT  DATE OF PROCEDURE:  03/20/2020  PREOPERATIVE DIAGNOSES:   1.  Large paraesophageal hiatal hernia. 2.  History of gastric volvulus.  POSTOPERATIVE DIAGNOSES:   1.  Large type 3 paraesophageal hiatal hernia. 2.  History of gastric volvulus.  PROCEDURE: 1.  Robotic Xi repair of large type 3 paraesophageal hiatal hernia with absorbable mesh and gastropexy and partial Toupet fundoplication. 2.  Esophagogastroduodenoscopy. 3.  Laparoscopic bilateral TAP block.  SURGEON:  [name], MD  ASSISTANT:  Romana Juniper MD.  An assistant was needed to help identify the anatomy  given its complexity with the majority of the stomach herniated into the chest, as well as to facilitate retraction of tissue.  ANESTHESIA:  General.  ESTIMATED BLOOD LOSS:  20 mL local consisted of a mixture of Marcaine and Exparel.  SPECIMENS:  Hernia sac, which was discarded.  INDICATIONS:  The patient is a very pleasant 64 year old female who was admitted to [facility] from June 24th through the 28th with gastric volvulus.  She had presented with a large paraesophageal hernia, which had volvulized.  She was managed  with an NG tube decompression and was able to be reduced.  She had an upper endoscopy at that time, as well as a CT scan.  She had had symptoms of GERD, as well as progressive weight loss in the early part of the summer due to early satiety, as well as  an acid sensation in her mouth.  She had a long history of a moderate-sized hiatal hernia.  She was able to be discharged to follow up to schedule surgery as an outpatient.  She was also able to stop her methotrexate for her rheumatoid arthritis.  We met  again in clinic.  We had a long discussion, which has been separately documented regarding risks and benefits of the  procedure, including but not limited to bleeding, infection, injury to surrounding structures, hernia recurrence, injury to the  esophagus or surrounding viscera, persistent heartburn or GERD symptoms,  postoperative trouble swallowing, gas bloat, blood clot formation, perioperative cardiac and pulmonary events.  DESCRIPTION OF PROCEDURE:  The patient was given oral Tylenol preoperatively.  She was given 5000 units of subcutaneous heparin.  She was taken to [facility] and placed supine on the operating room table.  Her arms were tucked at her side  with the appropriate padding.  General endotracheal anesthesia was established.  Sequential compression devices had been placed, along with a Foley catheter.  Her abdomen was prepped and draped in the usual standard surgical fashion with DuraPrep.  She  received IV antibiotic prior to skin incision.  A surgical timeout was performed.  Access to the abdomen was initiated with a Veress needle in the left upper quadrant at [place] point.  There were adequate 3 clicks of the Veress needle and a saline drop test easily was performed.  A pneumoperitoneum was established with initial normal  pressure and pneumoperitoneum was progressively continued to a smooth insufflation.  A small incision was made just above and to the left of the umbilicus.  Then, an 8 mm robotic trocar was advanced.  The robotic camera was then inserted & the left  upper quadrant, which was visualized.  A Veress needle was coming through the abdominal wall.  There was no evidence of injury to surrounding structures.  The Veress needle  was removed.  A bilateral laparoscopic TAP block was performed next with a  combination of Marcaine and Exparel along the left lateral abdominal wall and right lateral abdominal wall.  I then placed 3 additional robotic trocars, 2 on the left side of the abdomen 8 cm apart in a horizontal line and 1 final robotic trocar in the  right midabdomen and  also in line, all under direct visualization.  I placed a 12 mm assistant trocar in the right lateral abdominal wall in the midaxillary line, also under direct visualization.  The patient had been placed in extreme reverse  Trendelenburg.  A Nathanson liver retractor was placed underneath the left lobe of the liver to expose the hiatus.  There was excellent visualization of the hiatus.  The patient had a fair amount of her stomach herniated into her chest.  We then docked the robot.  A fenestrated bipolar was placed in arm #1, tip up retractor in arm 4 and a vessel sealer extend in arm 3 and the camera in trocar #2.  I then scrubbed out  and went to the robotic console.  My assistant stayed at bedside.  The patient had again probably about 80% of her stomach herniated into her chest.  There was also some omentum herniated into her chest.  The stomach appeared somewhat twisted as well,   again, probably still a persistent partial volvulus.  I ended up taking down some short gastrics first with the vessel sealer  since that was essentially tented up towards the left crus of the diaphragm.  We then turned our attention anteriorly along the  anterior edge of the hiatus and incised the peritoneum with the vessel sealer, then got into an avascular plane and carried the excision into the peritoneum toward the right crus of the diaphragm with the vessel sealer.  The gastrohepatic ligament was  taken down with the vessel sealer.  The right crura of the diaphragm was identified and again, we were able to incise the peritoneum medially to this into the avascular plane.  We then started reducing the hernia sac.  We did not really try to reduce the  stomach per se by grabbing the stomach as we did not want to create a serosal tear.  The patient had a very large hernia sac.  We started on the right side and continued anteriorly and across to the left side, mainly using a lot of gentle blunt  dissection with the  fenestrated bipolar and vessel sealer, taking down thin attachments with the vessel sealer.  We carried it over to the left side to the left crura of the diaphragm.  I was able to visualize the edge of the pleura on the left side and  gently then bluntly retracted it back up and off the hernia sac.  At this time, we took down some additional short gastrics along the greater curve of the stomach now that we could see more of this proximal stomach.  We identified the confluence of the  left and right crura of the diaphragm and my assistant was able to place a Penrose drain and threaded around the upper stomach to facilitate retraction.  We continued mobilizing the hernia sac up out of the chest.  I ended up taking down some of it and  dividing some of it with the vessel sealer.  My assistant was essential in helping identify the anatomy.  We identified the esophagus, the aorta and the posterior vagus fairly readily at this  point.   I was able to do a circumferential mobilization of  the esophagus in the mediastinum.  I did not visualize the anterior vagus, but we reidentified the posterior vagus.  All in all, it probably took about an hour and a half to mobilize the hernia sac and reduce it and take down the short gastrics along the  greater curvature of the stomach.  At this point, I thought that we had achieved enough intraabdominal esophagus.  Therefore, I obtained an Olympus endoscope and inserted it in the patient's oropharynx and guided it down into her esophagus, got into the  stomach with the endoscope.  There was no evidence of injury to the esophagus.  I identified the Z line and it was just at the hiatus.  When we looked with a double confirmation from the light from the endoscope on the robotic laparoscopic monitor, I  was therefore went back to the console and I continued mobilizing some of the esophagus out of the mediastinum, again taking great care to gently and bluntly lift up the esophagus.   Again, this was done circumferentially, taking down adventitia between  the esophagus and the aorta.  I never saw a pleural cavity.  At this point, we were able to achieve by doing that additional mobilization, a total of 3 cm of intraabdominal esophageal length.  At this point, I started to repair the diaphragm and to  perform a cruroplasty.  Pneumoperitoneum was reduced to 8 mmHg.  I then placed 5 interrupted 0 Ethibond sutures to reapproximate the crura.  There was a small gap left around the esophagus that I could place a robotic instrument through.  I decided to use  mesh to reinforce the diaphragm repair,  although it was not done under tension.  I obtained a piece of Gore Bio-A absorbable hiatal hernia mesh.  My assistant trimmed it trimmed it a little bit further.  She then inserted it in the abdomen.  I then  secured the mesh to the diaphragm with three 0 silk sutures, one on the right side and one over the cruroplasty posteriorly and then one on the patient's left side along the superior edge of the mesh.  The mesh was tucked behind the spleen, as well as  near the vena cava on the right side.  I placed a 2-0 silk suture along the greater curvature on the fundus to mark the location of where the wrap should be on the right.  The Penrose drain had been removed.  We then debulked the upper stomach and  excised some of the redundant hernia sac that was still attached to the stomach with the vessel sealer.  It was extracted.  I then, using a robotic instrument, brought the fundus retrogastric to the patient's right.  I was unable to perform a shoeshine  remover.  I decided to do a partial Toupet fundoplication.  We had anesthesia pass a lighted 56-French bougie.  It met resistance at the diaphragm, but we were ensured that the nurse anesthetist was not using excessive force.  I decided to remove one of  the cruroplasty sutures right next to the esophagus.  I was able to lift up the mesh slightly to  expose that the most superior cruroplasty Ethibond and my assistant was able to cut it.  We then had her readvanced the bougie and at this point, it passed  easily through the diaphragm closure.  I then performed a Toupet with 3 interrupted 0 Ethibond sutures  on the right, incorporating a seromuscular bite of the esophagus along the 11 o'clock position along the esophagus.  In a similar fashion, 3  interrupted 0 Ethibond sutures were placed on the left along the greater curvature to the esophagus at the 1 o'clock position.  The bougie was removed.  This resulted in a nice partial wrap.  I then placed 2 gastropexy sutures, one on the right from the  superior edge of the wrap to the anterior diaphragm on the right and in a similar fashion, a suture on the left at the top part of the wrap on the left to the diaphragm superomedially on the left.  I then went ahead and reperformed an endoscopy, advanced  it down into the stomach, insufflated the stomach and retroflexed and I was then able to visualize a Hill grade I valve view  when I retroflexed, confirming a partial and adequate wrap.  It did not appear twisted.  Stomach was decompressed.  I scrubbed  back in.  We removed the [facility] liver retractor.  A PMI suture passer was used to close the assistant trocar in the right lateral abdominal wall under direct visualization.  Additional block was placed in that location.  Pneumoperitoneum was released,  robotic trocars were removed.  Skin incisions were closed with a 4-0 Monocryl in a subcuticular fashion, followed by application of benzoin, gauze and Tegaderm.  Foley catheter was removed.  All needle, instrument and sponge counts were correct x2.   There were no immediate complications.  The patient tolerated the procedure well.  VN/NUANCE  D:03/20/2020 T:03/20/2020 JOB:[id number]

## 2020-03-21 ENCOUNTER — Other Ambulatory Visit: Payer: Self-pay | Admitting: Family Medicine

## 2020-03-21 ENCOUNTER — Encounter (HOSPITAL_COMMUNITY): Payer: Self-pay | Admitting: General Surgery

## 2020-03-21 ENCOUNTER — Inpatient Hospital Stay (HOSPITAL_COMMUNITY)

## 2020-03-21 LAB — BASIC METABOLIC PANEL
Anion gap: 8 (ref 5–15)
BUN: 7 mg/dL — ABNORMAL LOW (ref 8–23)
CO2: 25 mmol/L (ref 22–32)
Calcium: 8.3 mg/dL — ABNORMAL LOW (ref 8.9–10.3)
Chloride: 104 mmol/L (ref 98–111)
Creatinine, Ser: 0.62 mg/dL (ref 0.44–1.00)
GFR calc Af Amer: >60 mL/min (ref >60)
GFR calc non Af Amer: >60 mL/min (ref >60)
Glucose, Bld: 119 mg/dL — ABNORMAL HIGH (ref 70–99)
Potassium: 3.8 mmol/L (ref 3.5–5.1)
Sodium: 137 mmol/L (ref 135–145)

## 2020-03-21 LAB — MAGNESIUM: Magnesium: 1.8 mg/dL (ref 1.7–2.4)

## 2020-03-21 LAB — CBC
HCT: 33.6 % — ABNORMAL LOW (ref 36.0–46.0)
Hemoglobin: 11 g/dL — ABNORMAL LOW (ref 12.0–15.0)
MCH: 28.9 pg (ref 26.0–34.0)
MCHC: 32.7 g/dL (ref 30.0–36.0)
MCV: 88.2 fL (ref 80.0–100.0)
Platelets: 156 10*3/uL (ref 150–400)
RBC: 3.81 MIL/uL — ABNORMAL LOW (ref 3.87–5.11)
RDW: 14.6 % (ref 11.5–15.5)
WBC: 8.7 10*3/uL (ref 4.0–10.5)
nRBC: 0 % (ref 0.0–0.2)

## 2020-03-21 MED ORDER — OXYCODONE HCL 5 MG PO TABS: 5.0000 mg | ORAL_TABLET | Freq: Four times a day (QID) | ORAL | 0 refills | Status: DC | PRN

## 2020-03-21 MED ORDER — ACETAMINOPHEN 500 MG PO TABS: 1000.0000 mg | ORAL_TABLET | Freq: Three times a day (TID) | ORAL | 0 refills | Status: AC

## 2020-03-21 MED ORDER — ONDANSETRON HCL 4 MG PO TABS: 4.0000 mg | ORAL_TABLET | Freq: Three times a day (TID) | ORAL | 0 refills | Status: DC | PRN

## 2020-03-21 MED ORDER — IOHEXOL 300 MG/ML  SOLN
150.0000 mL | Freq: Once | INTRAMUSCULAR | Status: AC | PRN
  Administered 2020-03-21: 09:00:00 60 mL via ORAL

## 2020-03-21 NOTE — Discharge Instructions (Signed)
Try not to resume methotrexate for another 2-4 weeks  EATING AFTER YOUR ESOPHAGEAL SURGERY (Stomach Fundoplication, Hiatal Hernia repair, Achalasia surgery, etc)  ######################################################################  EAT Start with a pureed / full liquid diet (see below) Gradually transition to a high fiber diet with a fiber supplement over the next month after discharge.    WALK Walk an hour a day.  Control your pain to do that.    CONTROL PAIN Control pain so that you can walk, sleep, tolerate sneezing/coughing, go up/down stairs.  HAVE A BOWEL MOVEMENT DAILY Keep your bowels regular to avoid problems.  OK to try a laxative to override constipation.  OK to use an antidairrheal to slow down diarrhea.  Call if not better after 2 tries  CALL IF YOU HAVE PROBLEMS/CONCERNS Call if you are still struggling despite following these instructions. Call if you have concerns not answered by these instructions  ######################################################################   After your esophageal surgery, expect some sticking with swallowing over the next 1-2 months.    If food sticks when you eat, it is called "dysphagia".  This is due to swelling around your esophagus at the wrap & hiatal diaphragm repair.  It will gradually ease off over the next few months.  To help you through this temporary phase, we start you out on a pureed (blenderized) diet.  Your first meal in the hospital was thin liquids.  You should have been given a pureed diet by the time you left the hospital.  We ask patients to stay on a pureed diet for the first 2-3 weeks to avoid anything getting "stuck" near your recent surgery.  Don't be alarmed if your ability to swallow doesn't progress according to this plan.  Everyone is different and some diets can advance more or less quickly.    It is often helpful to crush your medications or split them as they can sometimes stick, especially the first  week or so.   Some BASIC RULES to follow are:  Maintain an upright position whenever eating or drinking.  Take small bites - just a teaspoon size bite at a time.  Eat slowly.  It may also help to eat only one food at a time.  Consider nibbling through smaller, more frequent meals & avoid the urge to eat BIG meals  Do not push through feelings of fullness, nausea, or bloatedness  Do not mix solid foods and liquids in the same mouthful  Try not to "wash foods down" with large gulps of liquids.  Avoid carbonated (bubbly/fizzy) drinks.    Avoid foods that make you feel gassy or bloated.  Start with bland foods first.  Wait on trying greasy, fried, or spicy meals until you are tolerating more bland solids well.  Understand that it will be hard to burp and belch at first.  This gradually improves with time.  Expect to be more gassy/flatulent/bloated initially.  Walking will help your body manage it better.  Consider using medications for bloating that contain simethicone such as  Maalox or Gas-X   Consider crushing her medications, especially smaller pills.  The ability to swallow pills should get easier after a few weeks  Eat in a relaxed atmosphere & minimize distractions.  Avoid talking while eating.    Do not use straws.  Following each meal, sit in an upright position (90 degree angle) for 60 to 90 minutes.  Going for a short walk can help as well  If food does stick, don't panic.  Try to  relax and let the food pass on its own.  Sipping WARM LIQUID such as strong hot black tea can also help slide it down.   Be gradual in changes & use common sense:  -If you easily tolerating a certain "level" of foods, advance to the next level gradually -If you are having trouble swallowing a particular food, then avoid it.   -If food is sticking when you advance your diet, go back to thinner previous diet (the lower LEVEL) for 1-2 days.   FULL LIQUID DIET  Reading food  labels  Check food labels of nutrition shakes for the amount of protein. Look for nutrition shakes that have at least 8-10 grams of protein in each serving.  Look for drinks, such as milks and juices, that are "fortified" or "enriched." This means that vitamins and minerals have been added. Shopping  Buy premade nutritional shakes to keep on hand.  To vary your choices, buy different flavors of milks and shakes. Meal planning 1. Choose flavors and foods that you enjoy. 2. To make sure you get enough energy from food (calories): ? Eat 3 full liquid meals each day. Have a liquid snack between each meal. ? Drink 6-8 ounces (177-237 ml) of a nutrition supplement shake with meals or as snacks. ? Add protein powder, powdered milk, milk, or yogurt to shakes to increase the amount of protein. 3. Drink at least one serving a day of citrus fruit juice or fruit juice that has vitamin C added. General guidelines 1. Before starting the full-liquid diet, check with your health care provider to know what foods you should avoid. These may include full-fat or high-fiber liquids. 2. You may have any liquid or food that becomes a liquid at room temperature. The food is considered a liquid if it can be poured off a spoon at room temperature. 3. Do not drink alcohol unless approved by your health care provider. 4. This diet gives you most of the nutrients that you need for energy, but you may not get enough of certain vitamins, minerals, and fiber. Make sure to talk to your health care provider or dietitian about: ? How many calories you need to eat get day. ? How much fluid you should have each day. ? Taking a multivitamin or a nutritional supplement. What foods are allowed? The items listed may not be a complete list. Talk with your dietitian about what dietary choices are best for you. Grains Thin hot cereal, such as cream of wheat. Soft-cooked pasta or rice pured in soup. Vegetables Pulp-free tomato or  vegetable juice. Vegetables pured in soup. Fruits Fruit juice without pulp. Strained fruit pures (seeds and skins removed). Meats and other protein foods Beef, chicken, and fish broths. Powdered protein supplements. Dairy Milk and milk-based beverages, including milk shakes and instant breakfast mixes. Smooth yogurt. Pured cottage cheese. Beverages Water. Coffee and tea (caffeinated or decaffeinated). Cocoa. Liquid nutritional supplements. Soft drinks. Nondairy milks, such as almond, coconut, rice, or soy milk. Fats and oils Melted margarine and butter. Cream. Canola, almond, avocado, corn, grapeseed, sunflower, and sesame oils. Gravy. Sweets and desserts Custard. Pudding. Flavored gelatin. Smooth ice cream (without nuts or candy pieces). Sherbet. Popsicles. [place] ice. Pudding pops. Seasoning and other foods Salt and pepper. Spices. Cocoa powder. Vinegar. Ketchup. Yellow mustard. Smooth sauces, such as [place]aise, cheese sauce, or white sauce. Soy sauce. Cream soups. Strained soups. Syrup. Honey. Jelly (without fruit pieces).  LEVEL 1 = PUREED DIET  Do for the first 2 WEEKS  AFTER SURGERY  -Foods in this group are pureed or blenderized to a smooth, mashed potato-like consistency.  -If necessary, the pureed foods can keep their shape with the addition of a thickening agent.   -Meat should be pureed to a smooth, pasty consistency.  Hot broth or gravy may be added to the pureed meat, approximately 1 oz. of liquid per 3 oz. serving of meat. -CAUTION:  If any foods do not puree into a smooth consistency, swallowing will be more difficult.  (For example, nuts or seeds sometimes do not blend well.)  Hot Foods Cold Foods  Pureed scrambled eggs and cheese Pureed cottage cheese  Baby cereals Thickened juices and nectars  Thinned cooked cereals (no lumps) Thickened milk or eggnog  Pureed [place] toast or pancakes Ensure  Mashed potatoes Ice cream  Pureed parsley, au gratin, scalloped  potatoes, candied sweet potatoes Fruit or [place] ice, sherbet  Pureed buttered or alfredo noodles Plain yogurt  Pureed vegetables (no corn or peas) Instant breakfast  Pureed soups and creamed soups Smooth pudding, mousse, custard  Pureed scalloped apples Whipped gelatin  Gravies Sugar, syrup, honey, jelly  Sauces, cheese, tomato, barbecue, white, creamed Cream  Any baby food Creamer  Alcohol in moderation (not beer or champagne) Margarine  Coffee or tea Mayonnaise   Ketchup, mustard   Apple sauce   SAMPLE MENU:  PUREED DIET Breakfast Lunch Dinner   Orange juice, 1/2 cup  Cream of wheat, 1/2 cup  Pineapple juice, 1/2 cup  Pureed [place], barley soup, 3/4 cup  Pureed Hawaiian chicken, 3 oz   Scrambled eggs, mashed or blended with cheese, 1/2 cup  Tea or coffee, 1 cup   Whole milk, 1 cup   Non-dairy creamer, 2 Tbsp.  Mashed potatoes, 1/2 cup  Pureed cooled broccoli, 1/2 cup  Apple sauce, 1/2 cup  Coffee or tea  Mashed potatoes, 1/2 cup  Pureed spinach, 1/2 cup  Frozen yogurt, 1/2 cup  Tea or coffee      LEVEL 2 = SOFT DIET  After your first 2 weeks, you can advance to a soft diet.   Keep on this diet until everything goes down easily.  Hot Foods Cold Foods  White fish Cottage cheese  Stuffed fish Junior baby fruit  Baby food meals Semi thickened juices  Minced soft cooked, scrambled, poached eggs nectars  Souffle & omelets Ripe mashed bananas  Cooked cereals Canned fruit, pineapple sauce, milk  potatoes Milkshake  Buttered or Alfredo noodles Custard  Cooked cooled vegetable Puddings, including tapioca  Sherbet Yogurt  Vegetable soup or alphabet soup Fruit ice, [place] ice  Gravies Whipped gelatin  Sugar, syrup, honey, jelly Junior baby desserts  Sauces:  Cheese, creamed, barbecue, tomato, white Cream  Coffee or tea Margarine   SAMPLE MENU:  LEVEL 2 Breakfast Lunch Dinner   Orange juice, 1/2 cup  Oatmeal, 1/2 cup  Scrambled eggs with cheese,  1/2 cup  Decaffeinated tea, 1 cup  Whole milk, 1 cup  Non-dairy creamer, 2 Tbsp  Pineapple juice, 1/2 cup  Minced beef, 3 oz  Gravy, 2 Tbsp  Mashed potatoes, 1/2 cup  Minced fresh broccoli, 1/2 cup  Applesauce, 1/2 cup  Coffee, 1 cup  [place], barley soup, 3/4 cup  Minced Hawaiian chicken, 3 oz  Mashed potatoes, 1/2 cup  Cooked spinach, 1/2 cup  Frozen yogurt, 1/2 cup  Non-dairy creamer, 2 Tbsp      LEVEL 3 = CHOPPED DIET  -After all the foods in level 2 (soft diet)  are passing through well you should advance up to more chopped foods.  -It is still important to cut these foods into small pieces and eat slowly.  Hot Foods Cold Foods  Poultry Cottage cheese  Chopped Swedish meatballs Yogurt  Meat salads (ground or flaked meat) Milk  Flaked fish (tuna) Milkshakes  Poached or scrambled eggs Soft, cold, dry cereal  Souffles and omelets Fruit juices or nectars  Cooked cereals Chopped canned fruit  Chopped [place] toast or pancakes Canned fruit cocktail  Noodles or pasta (no rice) Pudding, mousse, custard  Cooked vegetables (no frozen peas, corn, or mixed vegetables) Green salad  Canned small sweet peas Ice cream  Creamed soup or vegetable soup Fruit ice, [place] ice  Pureed vegetable soup or alphabet soup Non-dairy creamer  Ground scalloped apples Margarine  Gravies Mayonnaise  Sauces:  Cheese, creamed, barbecue, tomato, white Ketchup  Coffee or tea Mustard   SAMPLE MENU:  LEVEL 3 Breakfast Lunch Dinner   Orange juice, 1/2 cup  Oatmeal, 1/2 cup  Scrambled eggs with cheese, 1/2 cup  Decaffeinated tea, 1 cup  Whole milk, 1 cup  Non-dairy creamer, 2 Tbsp  Ketchup, 1 Tbsp  Margarine, 1 tsp  Salt, 1/4 tsp  Sugar, 2 tsp  Pineapple juice, 1/2 cup  Ground beef, 3 oz  Gravy, 2 Tbsp  Mashed potatoes, 1/2 cup  Cooked spinach, 1/2 cup  Applesauce, 1/2 cup  Decaffeinated coffee  Whole milk  Non-dairy creamer, 2 Tbsp  Margarine, 1  tsp  Salt, 1/4 tsp  Pureed [place], barley soup, 3/4 cup  Barbecue chicken, 3 oz  Mashed potatoes, 1/2 cup  Ground fresh broccoli, 1/2 cup  Frozen yogurt, 1/2 cup  Decaffeinated tea, 1 cup  Non-dairy creamer, 2 Tbsp  Margarine, 1 tsp  Salt, 1/4 tsp  Sugar, 1 tsp    LEVEL 4:  REGULAR FOODS  -Foods in this group are soft, moist, regularly textured foods.   -This level includes meat and breads, which tend to be the hardest things to swallow.   -Eat very slowly, chew well and continue to avoid carbonated drinks. -most people are at this level in 4-6 weeks  Hot Foods Cold Foods  Baked fish or skinned Soft cheeses - cottage cheese  Souffles and omelets Cream cheese  Eggs Yogurt  Stuffed shells Milk  Spaghetti with meat sauce Milkshakes  Cooked cereal Cold dry cereals (no nuts, dried fruit, coconut)  [place] toast or pancakes Crackers  Buttered toast Fruit juices or nectars  Noodles or pasta (no rice) Canned fruit  Potatoes (all types) Ripe bananas  Soft, cooked vegetables (no corn, lima, or baked beans) Peeled, ripe, fresh fruit  Creamed soups or vegetable soup Cakes (no nuts, dried fruit, coconut)  Canned chicken noodle soup Plain doughnuts  Gravies Ice cream  Bacon dressing Pudding, mousse, custard  Sauces:  Cheese, creamed, barbecue, tomato, white Fruit ice, [place] ice, sherbet  Decaffeinated tea or coffee Whipped gelatin  Pork chops Regular gelatin   Canned fruited gelatin molds   Sugar, syrup, honey, jam, jelly   Cream   Non-dairy   Margarine   Oil   Mayonnaise   Ketchup   Mustard   TROUBLESHOOTING IRREGULAR BOWELS  1) Avoid extremes of bowel movements (no bad constipation/diarrhea)  2) Miralax 17gm mixed in 8oz. water or juice-daily. May use BID as needed.  3) Gas-x,Phazyme, etc. as needed for gas & bloating.  4) Soft,bland diet. No spicy,greasy,fried foods.  5) Prilosec over-the-counter as needed  6) May hold  gluten/wheat products from diet to see if  symptoms improve.  7) May try probiotics (Align, Activa, etc) to help calm the bowels down  7) If symptoms become worse call back immediately.    If you have any questions please call our office at [place]: [phone].  ........Marland Kitchen   Managing Your Pain After Surgery Without Opioids    Thank you for participating in our program to help patients manage their pain after surgery without opioids. This is part of our effort to provide you with the best care possible, without exposing you or your family to the risk that opioids pose.  What pain can I expect after surgery? You can expect to have some pain after surgery. This is normal. The pain is typically worse the day after surgery, and quickly begins to get better. Many studies have found that many patients are able to manage their pain after surgery with Over-the-Counter (OTC) medications such as Tylenol and Motrin. If you have a condition that does not allow you to take Tylenol or Motrin, notify your surgical team.  How will I manage my pain? The best strategy for controlling your pain after surgery is around the clock pain control with Tylenol (acetaminophen) and Motrin (ibuprofen or Advil). Alternating these medications with each other allows you to maximize your pain control. In addition to Tylenol and Motrin, you can use heating pads or ice packs on your incisions to help reduce your pain.  How will I alternate your regular strength over-the-counter pain medication? You will take a dose of pain medication every three hours. ; Start by taking 650 mg of Tylenol (2 pills of 325 mg) ; 3 hours later take 600 mg of Motrin (3 pills of 200 mg) ; 3 hours after taking the Motrin take 650 mg of Tylenol ; 3 hours after that take 600 mg of Motrin.   - 1 -  See example - if your first dose of Tylenol is at 12:00 PM   12:00 PM Tylenol 650 mg (2 pills of 325 mg)  3:00 PM Motrin 600 mg (3 pills of 200 mg)  6:00 PM Tylenol 650  mg (2 pills of 325 mg)  9:00 PM Motrin 600 mg (3 pills of 200 mg)  Continue alternating every 3 hours   We recommend that you follow this schedule around-the-clock for at least 3 days after surgery, or until you feel that it is no longer needed. Use the table on the last page of this handout to keep track of the medications you are taking. Important: Do not take more than 3000mg  of Tylenol or 1600mg  of Motrin in a 24-hour period. Do not take ibuprofen/Motrin if you have a history of bleeding stomach ulcers, severe kidney disease, &/or actively taking a blood thinner  What if I still have pain? If you have pain that is not controlled with the over-the-counter pain medications (Tylenol and Motrin or Advil) you might have what we call "breakthrough" pain. You will receive a prescription for a small amount of an opioid pain medication such as Oxycodone, Tramadol, or Tylenol with Codeine. Use these opioid pills in the first 24 hours after surgery if you have breakthrough pain. Do not take more than 1 pill every 4-6 hours.  If you still have uncontrolled pain after using all opioid pills, don't hesitate to call our staff using the number provided. We will help make sure you are managing your pain in the best way possible, and if necessary, we can  provide a prescription for additional pain medication.   Day 1    Time  Name of Medication Number of pills taken  Amount of Acetaminophen  Pain Level   Comments  AM PM       AM PM       AM PM       AM PM       AM PM       AM PM       AM PM       AM PM       Total Daily amount of Acetaminophen Do not take more than  3,000 mg per day      Day 2    Time  Name of Medication Number of pills taken  Amount of Acetaminophen  Pain Level   Comments  AM PM       AM PM       AM PM       AM PM       AM PM       AM PM       AM PM       AM PM       Total Daily amount of Acetaminophen Do not take more than  3,000 mg per day      Day 3     Time  Name of Medication Number of pills taken  Amount of Acetaminophen  Pain Level   Comments  AM PM       AM PM       AM PM       AM PM          AM PM       AM PM       AM PM       AM PM       Total Daily amount of Acetaminophen Do not take more than  3,000 mg per day      Day 4    Time  Name of Medication Number of pills taken  Amount of Acetaminophen  Pain Level   Comments  AM PM       AM PM       AM PM       AM PM       AM PM       AM PM       AM PM       AM PM       Total Daily amount of Acetaminophen Do not take more than  3,000 mg per day      Day 5    Time  Name of Medication Number of pills taken  Amount of Acetaminophen  Pain Level   Comments  AM PM       AM PM       AM PM       AM PM       AM PM       AM PM       AM PM       AM PM       Total Daily amount of Acetaminophen Do not take more than  3,000 mg per day       Day 6    Time  Name of Medication Number of pills taken  Amount of Acetaminophen  Pain Level  Comments  AM PM       AM PM       AM  PM       AM PM       AM PM       AM PM       AM PM       AM PM       Total Daily amount of Acetaminophen Do not take more than  3,000 mg per day      Day 7    Time  Name of Medication Number of pills taken  Amount of Acetaminophen  Pain Level   Comments  AM PM       AM PM       AM PM       AM PM       AM PM       AM PM       AM PM       AM PM       Total Daily amount of Acetaminophen Do not take more than  3,000 mg per day        For additional information about how and where to safely dispose of unused opioid medications - [employer]  Disclaimer: This document contains information and/or instructional materials adapted from [place] for the typical patient with your condition. It does not replace medical advice from your health care provider because your experience may differ from that of the typical patient. Talk to your  health care provider if you have any questions about this document, your condition or your treatment plan. Adapted from [place]

## 2020-03-21 NOTE — Anesthesia Postprocedure Evaluation (Signed)
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 WITH MESH AND GASTROPEXY AND PARTIAL FUNDOPLICATION (N/A ) ESOPHAGOGASTRODUODENOSCOPY (EGD) (N/A )     Patient location during evaluation: PACU Anesthesia Type: General Level of consciousness: awake and alert, oriented and patient cooperative Pain management: pain level controlled Vital Signs Assessment: post-procedure vital signs reviewed and stable Respiratory status: spontaneous breathing, nonlabored ventilation and respiratory function stable Cardiovascular status: blood pressure returned to baseline and stable Postop Assessment: no apparent nausea or vomiting Anesthetic complications: no   No complications documented.  Last Vitals:  Vitals:   03/20/20 2155 03/21/20 0612  BP: 139/81 (!) 141/80  Pulse: 90 73  Resp: 15 15  Temp: 36.9 C 36.7 C  SpO2: 94% 99%    Last Pain:  Vitals:   03/21/20 0802  TempSrc:   PainSc: 0-No pain                 Pervis Hocking

## 2020-03-21 NOTE — Progress Notes (Signed)
1 Day Post-Op   Subjective/Chief Complaint: Doing well. Minimal nausea. Minimal pain.  Liquids going ok.  Ambulated.  walk No neck/chest pain  Objective: Vital signs in last 24 hours: Temp:  [97.8 F (36.6 C)-98.7 F (37.1 C)] 98.1 F (36.7 C) (08/31 0612) Pulse Rate:  [73-103] 73 (08/31 0612) Resp:  [14-18] 15 (08/31 0612) BP: (112-142)/(70-90) 141/80 (08/31 0612) SpO2:  [92 %-99 %] 99 % (08/31 0612) Last BM Date: 03/20/20  Intake/Output from previous day: 08/30 0701 - 08/31 0700 In: 4367.6 [P.O.:480; I.V.:3887.6] Out: 2985 [Urine:2965; Blood:20] Intake/Output this shift: No intake/output data recorded.  Alert, nad cta Reg Soft, min TTP, incisions ok  No edema  Lab Results:  Recent Labs    03/21/20 0514  WBC 8.7  HGB 11.0*  HCT 33.6*  PLT 156   BMET Recent Labs    03/21/20 0514  NA 137  K 3.8  CL 104  CO2 25  GLUCOSE 119*  BUN 7*  CREATININE 0.62  CALCIUM 8.3*   PT/INR No results for input(s): LABPROT, INR in the last 72 hours. ABG No results for input(s): PHART, HCO3 in the last 72 hours.  Invalid input(s): PCO2, PO2  Studies/Results: DG UGI W SINGLE CM (SOL OR THIN BA)  Result Date: 03/21/2020 CLINICAL DATA:  Postop day 1 from paraesophageal hernia repair. EXAM: WATER SOLUBLE UPPER GI SERIES TECHNIQUE: Single-column upper GI series was performed using water soluble contrast Omnipaque 300. COMPARISON:  None. FLUOROSCOPY TIME:  Fluoroscopy Time:  0 minutes 45 seconds Radiation Exposure Index (if provided by the fluoroscopic device): 10.6 mGy Number of Acquired Spot Images: 0 FINDINGS: The scout radiograph shows a nonobstructive bowel gas pattern. Water-soluble upper GI series shows normal appearance of the esophagus. Extrinsic impression is seen at the GE junction which is attributed to the fundoplication wrap and postop edema. No hiatal hernia is seen. No evidence of obstruction. No evidence of contrast leak or extravasation. Opacified portion of  the stomach is otherwise unremarkable in appearance. IMPRESSION: Expected postop changes from recent paraesophageal hernia repair and fundoplication. No evidence of postop leak, obstruction, or other complication. Electronically Signed   By: Marlaine Hind M.D.   On: 03/21/2020 09:15    Anti-infectives: Anti-infectives (From admission, onward)   Start     Dose/Rate Route Frequency Ordered Stop   03/20/20 0815  ceFAZolin (ANCEF) IVPB 2g/100 mL premix        2 g 200 mL/hr over 30 Minutes Intravenous On call to O.R. 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 No tachycardia No clinical signs of leak UGI looks good Will adv to full liquids Hopefully home later today Discussed dc instructions with pt - discharge diet progression, typical issues, etc  Leighton Ruff. Redmond Pulling, MD, FACS General, Bariatric, & Minimally Invasive Surgery [facility], [place]   LOS: 1 day    [name] 03/21/2020

## 2020-03-21 NOTE — Progress Notes (Signed)
Discharge instructions given to pt and all questions were answered.  

## 2020-03-23 ENCOUNTER — Telehealth: Payer: Self-pay

## 2020-03-23 NOTE — Telephone Encounter (Signed)
  Transition Care Management Unsuccessful Follow-up Telephone Call  Date of discharge and from where:  03/21/20  Attempts:  1st Attempt  Reason for unsuccessful TCM follow-up call:  Left voice message

## 2020-03-24 NOTE — Discharge Summary (Signed)
Physician Discharge Summary  Jaime Murray GYJ:[id number] DOB: 15-Dec-1955 DOA: 03/20/2020  PCP: Fayrene Helper, MD  Admit date: 03/20/2020 Discharge date: 03/21/2020  Recommendations for Outpatient Follow-up:     Follow-up Information    [name], MD. Schedule an appointment as soon as possible for a visit in 3 week(s).   Specialty: General Surgery Why: for postop check Contact information: [place] [address] [phone]              Discharge Diagnoses:  1. Paraesophageal hiatal hernia with GERD; h/o gastric volvulus 2. HTN 3. Diet controlled DM2 4. RA 5. Tobacco use  Surgical Procedure: Robotic Xi repair of large type 3 paraesophageal hiatal hernia with absorbable mesh and gastropexy and partial Toupet fundoplication.   Esophagogastroduodenoscopy.   Laparoscopic bilateral TAP block.  Discharge Condition: good Disposition: home  Diet recommendation: full liquid to pureed diet  Filed Weights   03/20/20 0839  Weight: 59 kg    History of present illness: The patient is a 64 year old female who presents with gastroesophageal reflux disease. She comes in to the clinic after being evaluated in the hospital for gastric volvulus. She was admitted from June 24 through the 28th. She was managed with NG tube decompression. She is accompanied by her daughter. She reports a several year history of intermittent regurgitation generally in about once a month but in June it increased significantly in frequency risk she was sterilely vomiting within about 15 minutes after eating a meal. It acutely worsened around June 24. She had weakness and was sent to the emergency room. She was hypokalemic. The CT scan demonstrated that the gastric body and antrum were above the diaphragm while the GE junction and proximal stomach were below the diaphragm consistent with organoaxial gastric volvulus. Nasogastric decompression was performed and she  improved significantly. Surgery and GI medicine for seeing the patient. She is still on a soft diet. She is now on omeprazole 40 mg once a day. They had increased that while she is in the hospital. She is had about a 20 pound weight loss over the past month and a half or so. She also reports early satiety. She will occasionally have an acid sensation in her mouth. She states that she occasionally [place] but can't. She denies any burning sensation per se. She had an upper endoscopy last in 2011 and had dilation along with a moderate size hiatal hernia. I cannot access the actual report per se. Her last in the hospital were unremarkable except for an albumin of 2.8. She does have rheumatoid arthritis and was on methotrexate but it was stopped on discharge. She smokes but is trying to cut down. A packable last 3 days. She also has well-controlled diabetes mellitus. She also has hypertension.   Hospital Course:  Brought in for planned robotic repair of paraesophageal hernia.  Please see operative note for additional details.  Enhanced recovery after surgery protocols were used.  She was maintained on perioperative chemical DVT prophylaxis.  On postop day 1 she was tolerating liquids.  She had no fever or tachycardia.  A postoperative upper GI demonstrated no evidence of leak or obstruction.  Had normal typical appearance of a partial fundoplication.  No longer evidence of a paraesophageal hernia.  She was able to tolerate full liquids.  She was deemed stable for discharge.  We had an extensive discussion regarding diet transition as well as management regarding the typical issues we see after this  type of surgery.  We also discussed discharge instructions.   Discharge Instructions  Discharge Instructions    Call MD for:   Complete by: As directed    Temperature >101   Call MD for:  hives   Complete by: As directed    Call MD for:  persistant dizziness or light-headedness   Complete  by: As directed    Call MD for:  persistant nausea and vomiting   Complete by: As directed    Call MD for:  redness, tenderness, or signs of infection (pain, swelling, redness, odor or green/yellow discharge around incision site)   Complete by: As directed    Call MD for:  severe uncontrolled pain   Complete by: As directed    Diet full liquid   Complete by: As directed    Discharge instructions   Complete by: As directed    See [facility] discharge instructions   Increase activity slowly   Complete by: As directed      Allergies as of 03/21/2020      Reactions   Gabapentin Palpitations      Medication List    STOP taking these medications   B-D TB SYRINGE 1CC/27GX1/2" 27G X 1/2" 1 ML Misc Generic drug: TUBERCULIN SYR 1CC/27GX1/2"     TAKE these medications   acetaminophen 500 MG tablet Commonly known as: TYLENOL Take 2 tablets (1,000 mg total) by mouth every 8 (eight) hours for 5 days. What changed:   medication strength  how much to take  when to take this  reasons to take this  additional instructions   aspirin 81 MG tablet Take 81 mg by mouth daily.   benazepril 5 MG tablet Commonly known as: LOTENSIN TAKE 1 TABLET DAILY   cetirizine 10 MG tablet Commonly known as: ZYRTEC TAKE 1 TABLET DAILY   cloNIDine 0.1 MG tablet Commonly known as: CATAPRES TAKE 1 TABLET AT BEDTIME   folic acid 1 MG tablet Commonly known as: FOLVITE Take 3 mg by mouth daily.   hydrochlorothiazide 25 MG tablet Commonly known as: HYDRODIURIL TAKE 1 TABLET DAILY   hydroxypropyl methylcellulose / hypromellose 2.5 % ophthalmic solution Commonly known as: ISOPTO TEARS / GONIOVISC Place 1 drop into both eyes 3 (three) times daily as needed for dry eyes.   lovastatin 40 MG tablet Commonly known as: MEVACOR TAKE 1 TABLET AT BEDTIME   methotrexate (PF) 50 MG/2ML injection Inject 0.9 mLs into the muscle once a week. Every Sunday   omeprazole 40 MG capsule Commonly known as:  PRILOSEC Take 40 mg by mouth in the morning and at bedtime. Take one capsule by mouth two times daily   ondansetron 4 MG tablet Commonly known as: Zofran Take 1 tablet (4 mg total) by mouth every 8 (eight) hours as needed for nausea or vomiting.   oxyCODONE 5 MG immediate release tablet Commonly known as: Oxy IR/ROXICODONE Take 1 tablet (5 mg total) by mouth every 6 (six) hours as needed for severe pain.   potassium chloride SA 20 MEQ tablet Commonly known as: Klor-Con M20 Take 1 tablet (20 mEq total) by mouth 2 (two) times daily. What changed: when to take this   Probiotic Daily Caps Take 1 capsule by mouth daily.   sertraline 50 MG tablet Commonly known as: ZOLOFT TAKE 1 TABLET DAILY ( DOSE INCREASE ) What changed:   how much to take  how to take this  when to take this  additional instructions       Follow-up  Information    [name], MD. Schedule an appointment as soon as possible for a visit in 3 week(s).   Specialty: General Surgery Why: for postop check Contact information: [address] [place] [address] [phone]                The results of significant diagnostics from this hospitalization (including imaging, microbiology, ancillary and laboratory) are listed below for reference.    Significant Diagnostic Studies: DG UGI W SINGLE CM (SOL OR THIN BA)  Result Date: 03/21/2020 CLINICAL DATA:  Postop day 1 from paraesophageal hernia repair. EXAM: WATER SOLUBLE UPPER GI SERIES TECHNIQUE: Single-column upper GI series was performed using water soluble contrast Omnipaque 300. COMPARISON:  None. FLUOROSCOPY TIME:  Fluoroscopy Time:  0 minutes 45 seconds Radiation Exposure Index (if provided by the fluoroscopic device): 10.6 mGy Number of Acquired Spot Images: 0 FINDINGS: The scout radiograph shows a nonobstructive bowel gas pattern. Water-soluble upper GI series shows normal appearance of the esophagus. Extrinsic impression is seen at  the GE junction which is attributed to the fundoplication wrap and postop edema. No hiatal hernia is seen. No evidence of obstruction. No evidence of contrast leak or extravasation. Opacified portion of the stomach is otherwise unremarkable in appearance. IMPRESSION: Expected postop changes from recent paraesophageal hernia repair and fundoplication. No evidence of postop leak, obstruction, or other complication. Electronically Signed   By: Marlaine Hind M.D.   On: 03/21/2020 09:15    Microbiology: Recent Results (from the past 240 hour(s))  SARS CORONAVIRUS 2 (TAT 6-24 HRS) Nasopharyngeal Nasopharyngeal Swab     Status: None   Collection Time: 03/16/20 10:01 AM   Specimen: Nasopharyngeal Swab  Result Value Ref Range Status   SARS Coronavirus 2 NEGATIVE NEGATIVE Final    Comment: (NOTE) SARS-CoV-2 target nucleic acids are NOT DETECTED.  The SARS-CoV-2 RNA is generally detectable in upper and lower respiratory specimens during the acute phase of infection. Negative results do not preclude SARS-CoV-2 infection, do not rule out co-infections with other pathogens, and should not be used as the sole basis for treatment or other patient management decisions. Negative results must be combined with clinical observations, patient history, and epidemiological information. The expected result is Negative.  Fact Sheet for Patients: SugarRoll.be  Fact Sheet for Healthcare Providers: https://www.woods-mathews.com/  This test is not yet approved or cleared by the [place] FDA and  has been authorized for detection and/or diagnosis of SARS-CoV-2 by FDA under an Emergency Use Authorization (EUA). This EUA will remain  in effect (meaning this test can be used) for the duration of the COVID-19 declaration under Se ction 564(b)(1) of the Act, 21 U.S.C. section 360bbb-3(b)(1), unless the authorization is terminated or revoked sooner.  Performed at [facility], [place] [address] [place], [place] [address]      Labs: Basic Metabolic Panel: Recent Labs  Lab 03/21/20 0514  NA 137  K 3.8  CL 104  CO2 25  GLUCOSE 119*  BUN 7*  CREATININE 0.62  CALCIUM 8.3*  MG 1.8   Liver Function Tests: No results for input(s): AST, ALT, ALKPHOS, BILITOT, PROT, ALBUMIN in the last 168 hours. No results for input(s): LIPASE, AMYLASE in the last 168 hours. No results for input(s): AMMONIA in the last 168 hours. CBC: Recent Labs  Lab 03/21/20 0514  WBC 8.7  HGB 11.0*  HCT 33.6*  MCV 88.2  PLT 156   Cardiac Enzymes: No results for input(s): CKTOTAL, CKMB,  CKMBINDEX, TROPONINI in the last 168 hours. BNP: BNP (last 3 results) No results for input(s): BNP in the last 8760 hours.  ProBNP (last 3 results) No results for input(s): PROBNP in the last 8760 hours.  CBG: Recent Labs  Lab 03/20/20 0805 03/20/20 1440 03/20/20 2239  GLUCAP 104* 165* 136*    Active Problems:   S/P repair of paraesophageal hernia   Time coordinating discharge: 15 min  Signed:  Gayland Curry, MD [facility], [place] [phone] 03/24/2020, 9:50 AM

## 2020-04-18 ENCOUNTER — Other Ambulatory Visit: Payer: Self-pay | Admitting: Family Medicine

## 2020-05-02 ENCOUNTER — Ambulatory Visit (INDEPENDENT_AMBULATORY_CARE_PROVIDER_SITE_OTHER)

## 2020-05-02 ENCOUNTER — Other Ambulatory Visit: Payer: Self-pay

## 2020-05-02 DIAGNOSIS — Z23 Encounter for immunization: Secondary | ICD-10-CM | POA: Diagnosis not present

## 2020-05-12 ENCOUNTER — Encounter

## 2020-05-16 ENCOUNTER — Ambulatory Visit (INDEPENDENT_AMBULATORY_CARE_PROVIDER_SITE_OTHER): Admitting: Family Medicine

## 2020-05-16 ENCOUNTER — Encounter: Payer: Self-pay | Admitting: Family Medicine

## 2020-05-16 ENCOUNTER — Other Ambulatory Visit: Payer: Self-pay

## 2020-05-16 VITALS — BP 156/93 | HR 82 | Resp 16 | Ht 59.0 in | Wt 135.0 lb

## 2020-05-16 DIAGNOSIS — Z0001 Encounter for general adult medical examination with abnormal findings: Secondary | ICD-10-CM | POA: Diagnosis not present

## 2020-05-16 DIAGNOSIS — E785 Hyperlipidemia, unspecified: Secondary | ICD-10-CM

## 2020-05-16 DIAGNOSIS — F17200 Nicotine dependence, unspecified, uncomplicated: Secondary | ICD-10-CM

## 2020-05-16 DIAGNOSIS — M2550 Pain in unspecified joint: Secondary | ICD-10-CM

## 2020-05-16 DIAGNOSIS — I10 Essential (primary) hypertension: Secondary | ICD-10-CM | POA: Diagnosis not present

## 2020-05-16 DIAGNOSIS — K219 Gastro-esophageal reflux disease without esophagitis: Secondary | ICD-10-CM

## 2020-05-16 DIAGNOSIS — Z716 Tobacco abuse counseling: Secondary | ICD-10-CM

## 2020-05-16 DIAGNOSIS — E1159 Type 2 diabetes mellitus with other circulatory complications: Secondary | ICD-10-CM

## 2020-05-16 DIAGNOSIS — Z1231 Encounter for screening mammogram for malignant neoplasm of breast: Secondary | ICD-10-CM

## 2020-05-16 MED ORDER — AMLODIPINE BESYLATE 5 MG PO TABS: 5.0000 mg | ORAL_TABLET | Freq: Every day | ORAL | 3 refills | Status: DC

## 2020-05-16 MED ORDER — PANTOPRAZOLE SODIUM 40 MG PO TBEC: 40.0000 mg | DELAYED_RELEASE_TABLET | Freq: Two times a day (BID) | ORAL | 0 refills | Status: DC

## 2020-05-16 NOTE — Progress Notes (Signed)
    Jaime Murray     MRN: [id number]      DOB: 11-25-1955  HPI: Patient is in for annual physical exam. Has ongoing need of and use of methotrexaterecovered well from hernia repair, now on highest PPI dose and needs GI f/u to determine ongoing management  Has d/c methotrexate during acute illness, advised to schedule appt to  Determine  Recent labs, if available are reviewed. Immunization is reviewed , and  updated if needed.   PE: BP (!) 156/93   Pulse 82   Resp 16   Ht 4\' 11"  (1.499 m)   Wt 135 lb (61.2 kg)   SpO2 98%   BMI 27.27 kg/m    Pleasant  female, alert and oriented x 3, in no cardio-pulmonary distress. Afebrile. HEENT No facial trauma or asymetry. Sinuses non tender.  Extra occullar muscles intact.. External ears normal, . Neck: supple, no adenopathy,JVD or thyromegaly.No bruits.  Chest: Clear to ascultation bilaterally.No crackles or wheezes. Non tender to palpation  Breast: Not examined, asymptomatic , will schedule mammogram, [id number] mammogram is normal  Cardiovascular system; Heart sounds normal,  S1 and  S2 ,no S3.  No murmur, or thrill. Apical beat not displaced Peripheral pulses normal.  Abdomen: Soft, non tender, no organomegaly or masses. No bruits. Bowel sounds normal. No guarding, tenderness or rebound. Midline surgical scar well healed      Musculoskeletal exam: Full ROM of spine, hips , shoulders and knees. No deformity ,swelling or crepitus noted. No muscle wasting or atrophy.   Neurologic: Cranial nerves 2 to 12 intact. Power, tone ,sensation and reflexes normal throughout. No disturbance in gait. No tremor.  Skin: Intact, no ulceration, erythema , scaling or rash noted. Pigmentation normal throughout  Psych; Normal mood and affect. Judgement and concentration normal   Assessment & Plan:  Encounter for annual general medical examination with abnormal findings in adult Annual exam as documented. Counseling done  re  healthy lifestyle involving commitment to 150 minutes exercise per week, heart healthy diet, and attaining healthy weight.The importance of adequate sleep also discussed. Regular seat belt use and home safety, is also discussed. Changes in health habits are decided on by the patient with goals and time frames  set for achieving them. Immunization and cancer screening needs are specifically addressed at this visit.   Hypertension goal BP (blood pressure) < 130/80 Uncontrolled , medication changed with 6 week f/u DASH diet and commitment to daily physical activity for a minimum of 30 minutes discussed and encouraged, as a part of hypertension management. The importance of attaining a healthy weight is also discussed.  BP/Weight 05/16/2020 03/21/2020 03/20/2020 03/16/2020 02/18/2020 01/25/2020 10/10/252  Systolic BP 270 623 - 762 831 517 616  Diastolic BP 93 80 - 78 72 66 72  Wt. (Lbs) 135 - 130 130 130 133 -  BMI 27.27 - 26.26 26.26 26.26 26.86 -       NICOTINE ADDICTION Asked:confirms currently smokes cigarettes Assess: Unwilling to set a quit date, but is cutting back Advise: needs to QUIT to reduce risk of cancer, cardio and cerebrovascular disease Assist: counseled for 5 minutes and literature provided Arrange: follow up in 2 to 4 months

## 2020-05-16 NOTE — Patient Instructions (Addendum)
Follow-up with MD reevaluate blood pressure in mid December call if you need me sooner.  Please schedule mammogram at checkout New for blood pressure which is uncontrolled is amlodipine 5 mg 1 daily.  Continue clonidine 1 at bedtime.  Stop benazepril hydrochlorothiazide and potassium and aspirin.  New higher dose of omeprazole twice daily is prescribed for 2 months only.  You need to be followed up by GI to determine how long you  will stay on this higher dose.  You need to schedule an appointment with your rheumatologist to discuss use of methotrexate.  Fasting lipids CMP and EGFR and HbA1c 1 week before December visit.  It is important that you exercise regularly at least 30 minutes 5 times a week. If you develop chest pain, have severe difficulty breathing, or feel very tired, stop exercising immediately and seek medical attention  Think about what you will eat, plan ahead. Choose " clean, green, fresh or frozen" over canned, processed or packaged foods which are more sugary, salty and fatty. 70 to 75% of food eaten should be vegetables and fruit. Three meals at set times with snacks allowed between meals, but they must be fruit or vegetables. Aim to eat over a 12 hour period , example 7 am to 7 pm, and STOP after  your last meal of the day. Drink water,generally about 64 ounces per day, no other drink is as healthy. Fruit juice is best enjoyed in a healthy way, by EATING the fruit. Thanks for choosing [facility], we consider it a privelige to serve you.

## 2020-05-16 NOTE — Assessment & Plan Note (Signed)

## 2020-05-17 ENCOUNTER — Encounter: Payer: Self-pay | Admitting: Gastroenterology

## 2020-05-21 ENCOUNTER — Encounter: Payer: Self-pay | Admitting: Family Medicine

## 2020-05-21 NOTE — Assessment & Plan Note (Signed)
Asked:confirms currently smokes cigarettes °Assess: Unwilling to set a quit date, but is cutting back °Advise: needs to QUIT to reduce risk of cancer, cardio and cerebrovascular disease °Assist: counseled for 5 minutes and literature provided °Arrange: follow up in 2 to 4 months ° °

## 2020-05-21 NOTE — Assessment & Plan Note (Signed)
Uncontrolled , medication changed with 6 week f/u DASH diet and commitment to daily physical activity for a minimum of 30 minutes discussed and encouraged, as a part of hypertension management. The importance of attaining a healthy weight is also discussed.  BP/Weight 05/16/2020 03/21/2020 03/20/2020 03/16/2020 02/18/2020 01/25/2020 1/98/2429  Systolic BP 980 699 - 967 227 737 505  Diastolic BP 93 80 - 78 72 66 72  Wt. (Lbs) 135 - 130 130 130 133 -  BMI 27.27 - 26.26 26.26 26.26 26.86 -

## 2020-06-30 ENCOUNTER — Other Ambulatory Visit: Payer: Self-pay

## 2020-06-30 ENCOUNTER — Encounter: Payer: Self-pay | Admitting: Gastroenterology

## 2020-06-30 ENCOUNTER — Ambulatory Visit (INDEPENDENT_AMBULATORY_CARE_PROVIDER_SITE_OTHER): Admitting: Gastroenterology

## 2020-06-30 VITALS — BP 136/88 | HR 103 | Temp 97.6°F | Ht 59.0 in | Wt 137.0 lb

## 2020-06-30 DIAGNOSIS — K219 Gastro-esophageal reflux disease without esophagitis: Secondary | ICD-10-CM | POA: Diagnosis not present

## 2020-06-30 MED ORDER — PANTOPRAZOLE SODIUM 40 MG PO TBEC: 40.0000 mg | DELAYED_RELEASE_TABLET | Freq: Every day | ORAL | 3 refills | Status: DC

## 2020-06-30 NOTE — Progress Notes (Signed)
CC'ED TO PCP 

## 2020-06-30 NOTE — Patient Instructions (Addendum)
Decrease Protonix to once daily, 30 minutes before breakfast. Call if recurrent reflux.   We will see you back in April 2022 to arrange a colonoscopy!  I am so glad you are doing well! Have a wonderful Christmas!  I enjoyed seeing you again today! As you know, I value our relationship and want to provide genuine, compassionate, and quality care. I welcome your feedback. If you receive a survey regarding your visit,  I greatly appreciate you taking time to fill this out. See you next time!  Annitta Needs, PhD, ANP-BC [facility]

## 2020-06-30 NOTE — Progress Notes (Signed)
Referring Provider: Fayrene Helper, MD Primary Care Physician:  Fayrene Helper, MD Primary GI: Dr. Gala Romney    Chief Complaint  Patient presents with  . Gastroesophageal Reflux    Doing fine    HPI:   Jaime Murray is a 64 y.o. female presenting today with history of GERD, personal history of adenomas and family history of colon cancer, with next surveillance colonoscopy due in May 2022. At last visit in June 2021, she was sent to the ED due to abdominal pain, N/V, weight loss. Found to have gastric volvulus. Underwent paraesophageal hiatal hernia repair by Dr. Redmond Pulling. Returns in routine follow-up today.  She is on Protonix BID currently. Methotrexate once per week. GERD well-controlled. No abdominal pain, N/V. No dysphagia. No rectal bleeding. No changes in bowel habits. May 2022 surveillance colonoscopy due.   3-4 beers on Friday and Saturday.   Past Medical History:  Diagnosis Date  . Anxiety   . Arthritis   . Constipation   . Depression   . DM (diabetes mellitus) ([place])   . Fatigue   . GERD (gastroesophageal reflux disease)   . HTN (hypertension)   . Hypercholesteremia   . IDA (iron deficiency anemia)     Past Surgical History:  Procedure Laterality Date  . COLONOSCOPY  01/01/10   Dr. Hansel Feinstein and sigmoid polyps, diminutive rectosigmoid polyp, tubular adenomas  . COLONOSCOPY N/A 12/06/2015   normal  . ESOPHAGOGASTRODUODENOSCOPY  12/14/09   Dr. Rourk:tight schatzki's ring s/p dilation/moderate size hiatal hernia  . ESOPHAGOGASTRODUODENOSCOPY N/A 03/20/2020   Procedure: ESOPHAGOGASTRODUODENOSCOPY (EGD);  Surgeon: [name], MD;  Location: [facility];  Service: General;  Laterality: N/A;  . GANGLION CYST EXCISION  05/22/2012   Procedure: REMOVAL GANGLION OF WRIST;  Surgeon: Carole Civil, MD;  Location: [facility];  Service: Orthopedics;  Laterality: Left;  . XI ROBOTIC ASSISTED PARAESOPHAGEAL HERNIA REPAIR N/A 03/20/2020   Procedure: ROBOTIC REPAIR OF  LARGE PARAESOPHAGEAL HIATAL HERNIA WITH MESH AND GASTROPEXY AND PARTIAL FUNDOPLICATION;  Surgeon: [name], MD;  Location: [facility];  Service: General;  Laterality: N/A;    Current Outpatient Medications  Medication Sig Dispense Refill  . amLODipine (NORVASC) 5 MG tablet Take 1 tablet (5 mg total) by mouth daily. 30 tablet 3  . cetirizine (ZYRTEC) 10 MG tablet TAKE 1 TABLET DAILY (Patient taking differently: Take 10 mg by mouth daily.) 90 tablet 3  . cloNIDine (CATAPRES) 0.1 MG tablet TAKE 1 TABLET AT BEDTIME 90 tablet 0  . folic acid (FOLVITE) 1 MG tablet Take 3 mg by mouth daily.    . hydroxypropyl methylcellulose / hypromellose (ISOPTO TEARS / GONIOVISC) 2.5 % ophthalmic solution Place 1 drop into both eyes 3 (three) times daily as needed for dry eyes.    Marland Kitchen lovastatin (MEVACOR) 40 MG tablet TAKE 1 TABLET AT BEDTIME 90 tablet 3  . Methotrexate Sodium (METHOTREXATE, PF,) 50 MG/2ML injection Inject 0.9 mLs into the muscle once a week. Every Sunday    . Probiotic Product (PROBIOTIC DAILY) CAPS Take 1 capsule by mouth daily.    . sertraline (ZOLOFT) 50 MG tablet TAKE 1 TABLET DAILY 90 tablet 3  . pantoprazole (PROTONIX) 40 MG tablet Take 1 tablet (40 mg total) by mouth daily. 30 minutes before breakfast 90 tablet 3   No current facility-administered medications for this visit.    Allergies as of 06/30/2020 - Review Complete 06/30/2020  Allergen Reaction Noted  . Gabapentin Palpitations 06/23/2017    Family History  Problem  Relation Age of Onset  . Hypertension Mother   . Colon cancer Mother        unknown age of disease, still living, cancer-free   . Hypertension Father   . Stroke Father   . Heart disease Other   . Arthritis Other     Social History   Socioeconomic History  . Marital status: Married    Spouse name: Not on file  . Number of children: Not on file  . Years of education: 67  . Highest education level: Not on file  Occupational History    Employer:  [employer]  Tobacco Use  . Smoking status: Current Every Day Smoker    Packs/day: 0.50    Years: 30.00    Pack years: 15.00    Types: Cigarettes  . Smokeless tobacco: Never Used  Vaping Use  . Vaping Use: Never used  Substance and Sexual Activity  . Alcohol use: Yes    Comment: 3-4 beers on Friday and Saturday, no longer during week   . Drug use: No  . Sexual activity: Yes    Birth control/protection: Post-menopausal  Other Topics Concern  . Not on file  Social History Narrative  . Not on file   Social Determinants of Health   Financial Resource Strain: Not on file  Food Insecurity: Not on file  Transportation Needs: Not on file  Physical Activity: Not on file  Stress: Not on file  Social Connections: Not on file    Review of Systems: Gen: Denies fever, chills, anorexia. Denies fatigue, weakness, weight loss.  CV: Denies chest pain, palpitations, syncope, peripheral edema, and claudication. Resp: Denies dyspnea at rest, cough, wheezing, coughing up blood, and pleurisy. GI: see HPI Derm: Denies rash, itching, dry skin Psych: Denies depression, anxiety, memory loss, confusion. No homicidal or suicidal ideation.  Heme: Denies bruising, bleeding, and enlarged lymph nodes.  Physical Exam: BP 136/88   Pulse (!) 103   Temp 97.6 F (36.4 C)   Ht 4\' 11"  (1.499 m)   Wt 137 lb (62.1 kg)   BMI 27.67 kg/m  General:   Alert and oriented. No distress noted. Pleasant and cooperative.  Head:  Normocephalic and atraumatic. Eyes:  Conjuctiva clear without scleral icterus. Mouth:  Mask in place Abdomen:  +BS, soft, non-tender and non-distended. No rebound or guarding. No HSM or masses noted. Well-healed laparoscopic incisions. Msk:  Symmetrical without gross deformities. Normal posture. Extremities:  Without edema. Neurologic:  Alert and  oriented x4 Psych:  Alert and cooperative. Normal mood and affect.  ASSESSMENT: Jaime Murray is a 64 y.o. female  presenting today with history of GERD, recently with gastric volvulus and undergoing repair in June 2021. Doing well from a GI standpoint today.  She is on methotrexate once weekly and currently on BID PPI. Need to decrease to lowest dosing due to potential increased methotrexate levels. Will decrease to once daily dosing.   Surveillance colonoscopy due in May 2022. No concerning lower GI signs/symptoms.   PLAN:  Decrease to once daily dosing PPI  Follow-up in April 2022  Colonoscopy to be arranged at next appt  Annitta Needs, PhD, ANP-BC [facility]

## 2020-07-05 LAB — CMP14+EGFR
ALT: 22 IU/L (ref 0–32)
AST: 22 IU/L (ref 0–40)
Albumin/Globulin Ratio: 1.8 (ref 1.2–2.2)
Albumin: 4.3 g/dL (ref 3.8–4.8)
Alkaline Phosphatase: 80 IU/L (ref 44–121)
BUN/Creatinine Ratio: 18 (ref 12–28)
BUN: 9 mg/dL (ref 8–27)
Bilirubin Total: 0.2 mg/dL (ref 0.0–1.2)
CO2: 23 mmol/L (ref 20–29)
Calcium: 9 mg/dL (ref 8.7–10.3)
Chloride: 105 mmol/L (ref 96–106)
Creatinine, Ser: 0.49 mg/dL — ABNORMAL LOW (ref 0.57–1.00)
GFR calc Af Amer: 121 mL/min/{1.73_m2} (ref >59)
GFR calc non Af Amer: 105 mL/min/{1.73_m2} (ref >59)
Globulin, Total: 2.4 g/dL (ref 1.5–4.5)
Glucose: 116 mg/dL — ABNORMAL HIGH (ref 65–99)
Potassium: 3.4 mmol/L — ABNORMAL LOW (ref 3.5–5.2)
Sodium: 143 mmol/L (ref 134–144)
Total Protein: 6.7 g/dL (ref 6.0–8.5)

## 2020-07-05 LAB — LIPID PANEL
Chol/HDL Ratio: 3.4 ratio (ref 0.0–4.4)
Cholesterol, Total: 229 mg/dL — ABNORMAL HIGH (ref 100–199)
HDL: 68 mg/dL (ref >39)
LDL Chol Calc (NIH): 105 mg/dL — ABNORMAL HIGH (ref 0–99)
Triglycerides: 330 mg/dL — ABNORMAL HIGH (ref 0–149)
VLDL Cholesterol Cal: 56 mg/dL — ABNORMAL HIGH (ref 5–40)

## 2020-07-05 LAB — HEMOGLOBIN A1C
Est. average glucose Bld gHb Est-mCnc: 126 mg/dL
Hgb A1c MFr Bld: 6 % — ABNORMAL HIGH (ref 4.8–5.6)

## 2020-07-06 ENCOUNTER — Ambulatory Visit: Admitting: Family Medicine

## 2020-07-07 ENCOUNTER — Encounter

## 2020-07-13 ENCOUNTER — Ambulatory Visit (INDEPENDENT_AMBULATORY_CARE_PROVIDER_SITE_OTHER): Admitting: Family Medicine

## 2020-07-13 ENCOUNTER — Other Ambulatory Visit: Payer: Self-pay

## 2020-07-13 ENCOUNTER — Encounter: Payer: Self-pay | Admitting: Family Medicine

## 2020-07-13 VITALS — BP 130/80 | HR 80 | Resp 16 | Ht 59.0 in | Wt 138.1 lb

## 2020-07-13 DIAGNOSIS — Z1231 Encounter for screening mammogram for malignant neoplasm of breast: Secondary | ICD-10-CM

## 2020-07-13 DIAGNOSIS — F321 Major depressive disorder, single episode, moderate: Secondary | ICD-10-CM

## 2020-07-13 DIAGNOSIS — E559 Vitamin D deficiency, unspecified: Secondary | ICD-10-CM

## 2020-07-13 DIAGNOSIS — E785 Hyperlipidemia, unspecified: Secondary | ICD-10-CM

## 2020-07-13 DIAGNOSIS — E1159 Type 2 diabetes mellitus with other circulatory complications: Secondary | ICD-10-CM

## 2020-07-13 DIAGNOSIS — I10 Essential (primary) hypertension: Secondary | ICD-10-CM | POA: Diagnosis not present

## 2020-07-13 DIAGNOSIS — E663 Overweight: Secondary | ICD-10-CM | POA: Diagnosis not present

## 2020-07-13 DIAGNOSIS — F17200 Nicotine dependence, unspecified, uncomplicated: Secondary | ICD-10-CM

## 2020-07-13 MED ORDER — CETIRIZINE HCL 10 MG PO TABS
10.0000 mg | ORAL_TABLET | Freq: Every day | ORAL | 3 refills | Status: DC
Start: 2020-07-13 — End: 2021-06-15

## 2020-07-13 MED ORDER — AMLODIPINE BESYLATE 5 MG PO TABS: 5.0000 mg | ORAL_TABLET | Freq: Every day | ORAL | 3 refills | Status: DC

## 2020-07-13 MED ORDER — AMLODIPINE BESYLATE 5 MG PO TABS: 5.0000 mg | ORAL_TABLET | Freq: Every day | ORAL | 1 refills | Status: DC

## 2020-07-13 NOTE — Progress Notes (Signed)
Jaime Murray     MRN: [id number]      DOB: July 06, 1956   HPI Jaime Murray is here for follow up and re-evaluation of chronic medical conditions, medication management and review of any available recent lab and radiology data.  Preventive health is updated, specifically  Cancer screening and Immunization.   Questions or concerns regarding consultations or procedures which the PT has had in the interim are  addressed. The PT denies any adverse reactions to current medications since the last visit.  There are no new concerns.  There are no specific complaints   ROS Denies recent fever or chills. Denies sinus pressure, nasal congestion, ear pain or sore throat. Denies chest congestion, productive cough or wheezing. Denies chest pains, palpitations and leg swelling Denies abdominal pain, nausea, vomiting,diarrhea or constipation.   Denies dysuria, frequency, hesitancy or incontinence. Denies joint pain, swelling and limitation in mobility. Denies headaches, seizures, numbness, or tingling. Denies depression, anxiety or insomnia. Denies skin break down or rash.   PE  BP 130/80   Pulse 80   Resp 16   Ht 4\' 11"  (1.499 m)   Wt 138 lb 1.3 oz (62.6 kg)   SpO2 95%   BMI 27.89 kg/m   Patient alert and oriented and in no cardiopulmonary distress.  HEENT: No facial asymmetry, EOMI,     Neck supple .  Chest: Clear to auscultation bilaterally.  CVS: S1, S2 no murmurs, no S3.Regular rate.  ABD: Soft non tender.   Ext: No edema  MS: Adequate ROM spine, shoulders, hips and knees.  Skin: Intact, no ulcerations or rash noted.  Psych: Good eye contact, normal affect. Memory intact not anxious or depressed appearing.  CNS: CN 2-12 intact, power,  normal throughout.no focal deficits noted.   Assessment & Plan  Hyperlipidemia LDL goal <100 Hyperlipidemia:Low fat diet discussed and encouraged.   Lipid Panel  Lab Results  Component Value Date   CHOL 229 (H) 07/04/2020   HDL  68 07/04/2020   LDLCALC 105 (H) 07/04/2020   TRIG 330 (H) 07/04/2020   CHOLHDL 3.4 07/04/2020     Uncontrolled, compliance a challenge, re educated Updated lab needed at/ before next visit.   Hypertension goal BP (blood pressure) < 130/80 Controlled, no change in medication DASH diet and commitment to daily physical activity for a minimum of 30 minutes discussed and encouraged, as a part of hypertension management. The importance of attaining a healthy weight is also discussed.  BP/Weight 07/13/2020 06/30/2020 05/16/2020 03/21/2020 03/20/2020 03/16/2020 02/18/2020  Systolic BP 130 136 156 141 - 102 128  Diastolic BP 80 88 93 80 - 78 72  Wt. (Lbs) 138.08 137 135 - 130 130 130  BMI 27.89 27.67 27.27 - 26.26 26.26 26.26       NICOTINE ADDICTION Asked:confirms currently smokes cigarettes, 3 /day Assess: Unwilling to set a quit date, but is cutting back and wants to quit. States knows she needs to quit Advise: needs to QUIT to reduce risk of cancer, cardio and cerebrovascular disease Assist: counseled for 5 minutes and literature provided Arrange: follow up in 2 to 4 months   DM (diabetes mellitus) (HCC) Jaime Murray is reminded of the importance of commitment to daily physical activity for 30 minutes or more, as able and the need to limit carbohydrate intake to 30 to 60 grams per meal to help with blood sugar control.   Jaime Murray is reminded of the importance of daily foot exam, annual eye examination, and good  blood sugar, blood pressure and cholesterol control.  Diabetic Labs Latest Ref Rng & Units 07/04/2020 03/21/2020 03/16/2020 01/21/2020 01/16/2020  HbA1c 4.8 - 5.6 % 6.0(H) - 6.0(H) - -  Microalbumin mg/dL - - - - -  Micro/Creat Ratio 0.0 - 30.0 mg/g creat - - - - -  Chol 100 - 199 mg/dL 229(H) - - - -  HDL >39 mg/dL 68 - - - -  Calc LDL 0 - 99 mg/dL 105(H) - - - -  Triglycerides 0 - 149 mg/dL 330(H) - - - -  Creatinine 0.57 - 1.00 mg/dL 0.49(L) 0.62 0.50 0.54 0.54    BP/Weight 07/13/2020 06/30/2020 05/16/2020 03/21/2020 03/20/2020 03/16/2020 10/28/8117  Systolic BP 147 829 562 130 - 865 784  Diastolic BP 80 88 93 80 - 78 72  Wt. (Lbs) 138.08 137 135 - 130 130 130  BMI 27.89 27.67 27.27 - 26.26 26.26 26.26   Foot/eye exam completion dates Latest Ref Rng & Units 04/08/2019 09/01/2018  Eye Exam No Retinopathy - No Retinopathy  Foot Form Completion - Done -        Overweight (BMI 25.0-29.9)  Patient re-educated about  the importance of commitment to a  minimum of 150 minutes of exercise per week as able.  The importance of healthy food choices with portion control discussed, as well as eating regularly and within a 12 hour window most days. The need to choose "clean , green" food 50 to 75% of the time is discussed, as well as to make water the primary drink and set a goal of 64 ounces water daily.    Weight /BMI 07/13/2020 06/30/2020 05/16/2020  WEIGHT 138 lb 1.3 oz 137 lb 135 lb  HEIGHT 4\' 11"  4\' 11"  4\' 11"   BMI 27.89 kg/m2 27.67 kg/m2 27.27 kg/m2      Moderate major depression, single episode (HCC) Controlled, no change in medication

## 2020-07-13 NOTE — Patient Instructions (Addendum)
F/u in office end May, with MD, call if you need me sooner  Please take medication EVERY day  Good now smoking 3 cigarettes / day and that you accept that quitting is best, go for it!   Please schedule mammogram at checkout    Please get fasting lipid, cmp and eGFr , hBA1C, tSH and vit D 5 days before appointment  It is important that you exercise regularly at least 30 minutes 5 times a week. If you develop chest pain, have severe difficulty breathing, or feel very tired, stop exercising immediately and seek medical attention   Think about what you will eat, plan ahead. Choose " clean, green, fresh or frozen" over canned, processed or packaged foods which are more sugary, salty and fatty. 70 to 75% of food eaten should be vegetables and fruit. Three meals at set times with snacks allowed between meals, but they must be fruit or vegetables. Aim to eat over a 12 hour period , example 7 am to 7 pm, and STOP after  your last meal of the day. Drink water,generally about 64 ounces per day, no other drink is as healthy. Fruit juice is best enjoyed in a healthy way, by EATING the fruit. Thanks for choosing [facility], we consider it a privelige to serve you.

## 2020-07-17 ENCOUNTER — Encounter: Payer: Self-pay | Admitting: Family Medicine

## 2020-07-17 DIAGNOSIS — F324 Major depressive disorder, single episode, in partial remission: Secondary | ICD-10-CM | POA: Insufficient documentation

## 2020-07-17 NOTE — Assessment & Plan Note (Signed)
Jaime Murray is reminded of the importance of commitment to daily physical activity for 30 minutes or more, as able and the need to limit carbohydrate intake to 30 to 60 grams per meal to help with blood sugar control.   Jaime Murray is reminded of the importance of daily foot exam, annual eye examination, and good blood sugar, blood pressure and cholesterol control.  Diabetic Labs Latest Ref Rng & Units 07/04/2020 03/21/2020 03/16/2020 01/21/2020 01/16/2020  HbA1c 4.8 - 5.6 % 6.0(H) - 6.0(H) - -  Microalbumin mg/dL - - - - -  Micro/Creat Ratio 0.0 - 30.0 mg/g creat - - - - -  Chol 100 - 199 mg/dL 982(M) - - - -  HDL >41 mg/dL 68 - - - -  Calc LDL 0 - 99 mg/dL 583(E) - - - -  Triglycerides 0 - 149 mg/dL 940(H) - - - -  Creatinine 0.57 - 1.00 mg/dL 6.80(S) 8.11 0.31 5.94 0.54   BP/Weight 07/13/2020 06/30/2020 05/16/2020 03/21/2020 03/20/2020 03/16/2020 02/18/2020  Systolic BP 130 136 156 141 - 585 128  Diastolic BP 80 88 93 80 - 78 72  Wt. (Lbs) 138.08 137 135 - 130 130 130  BMI 27.89 27.67 27.27 - 26.26 26.26 26.26   Foot/eye exam completion dates Latest Ref Rng & Units 04/08/2019 09/01/2018  Eye Exam No Retinopathy - No Retinopathy  Foot Form Completion - Done -

## 2020-07-17 NOTE — Assessment & Plan Note (Signed)
Controlled, no change in medication  

## 2020-07-17 NOTE — Assessment & Plan Note (Signed)
  Patient re-educated about  the importance of commitment to a  minimum of 150 minutes of exercise per week as able.  The importance of healthy food choices with portion control discussed, as well as eating regularly and within a 12 hour window most days. The need to choose "clean , green" food 50 to 75% of the time is discussed, as well as to make water the primary drink and set a goal of 64 ounces water daily.    Weight /BMI 07/13/2020 06/30/2020 05/16/2020  WEIGHT 138 lb 1.3 oz 137 lb 135 lb  HEIGHT 4\' 11"  4\' 11"  4\' 11"   BMI 27.89 kg/m2 27.67 kg/m2 27.27 kg/m2

## 2020-07-17 NOTE — Assessment & Plan Note (Signed)
Asked:confirms currently smokes cigarettes, 3 /day Assess: Unwilling to set a quit date, but is cutting back and wants to quit. States knows she needs to quit Advise: needs to QUIT to reduce risk of cancer, cardio and cerebrovascular disease Assist: counseled for 5 minutes and literature provided Arrange: follow up in 2 to 4 months

## 2020-07-17 NOTE — Assessment & Plan Note (Signed)
Hyperlipidemia:Low fat diet discussed and encouraged.   Lipid Panel  Lab Results  Component Value Date   CHOL 229 (H) 07/04/2020   HDL 68 07/04/2020   LDLCALC 105 (H) 07/04/2020   TRIG 330 (H) 07/04/2020   CHOLHDL 3.4 07/04/2020     Uncontrolled, compliance a challenge, re educated Updated lab needed at/ before next visit.

## 2020-07-17 NOTE — Assessment & Plan Note (Signed)
Controlled, no change in medication DASH diet and commitment to daily physical activity for a minimum of 30 minutes discussed and encouraged, as a part of hypertension management. The importance of attaining a healthy weight is also discussed.  BP/Weight 07/13/2020 06/30/2020 05/16/2020 03/21/2020 03/20/2020 03/16/2020 8/67/6720  Systolic BP 947 096 283 662 - 947 654  Diastolic BP 80 88 93 80 - 78 72  Wt. (Lbs) 138.08 137 135 - 130 130 130  BMI 27.89 27.67 27.27 - 26.26 26.26 26.26

## 2020-07-19 ENCOUNTER — Other Ambulatory Visit: Payer: Self-pay | Admitting: Family Medicine

## 2020-07-28 ENCOUNTER — Encounter

## 2020-09-15 ENCOUNTER — Other Ambulatory Visit: Payer: Self-pay | Admitting: Physician Assistant

## 2020-09-15 DIAGNOSIS — Z13820 Encounter for screening for osteoporosis: Secondary | ICD-10-CM

## 2020-09-22 ENCOUNTER — Other Ambulatory Visit (HOSPITAL_COMMUNITY): Payer: Self-pay | Admitting: Family Medicine

## 2020-09-22 DIAGNOSIS — Z1231 Encounter for screening mammogram for malignant neoplasm of breast: Secondary | ICD-10-CM

## 2020-10-02 ENCOUNTER — Other Ambulatory Visit: Payer: Self-pay

## 2020-10-02 ENCOUNTER — Ambulatory Visit (HOSPITAL_COMMUNITY)
Admission: RE | Admit: 2020-10-02 | Discharge: 2020-10-02 | Disposition: A | Discharge status: Home / Self Care | Source: Ambulatory Visit | Attending: Family Medicine | Admitting: Family Medicine

## 2020-10-02 DIAGNOSIS — Z1231 Encounter for screening mammogram for malignant neoplasm of breast: Secondary | ICD-10-CM | POA: Diagnosis present

## 2020-10-31 ENCOUNTER — Ambulatory Visit: Admitting: Gastroenterology

## 2020-11-14 ENCOUNTER — Other Ambulatory Visit: Payer: Self-pay | Admitting: Gastroenterology

## 2020-11-14 ENCOUNTER — Telehealth: Payer: Self-pay | Admitting: Internal Medicine

## 2020-11-14 DIAGNOSIS — K219 Gastro-esophageal reflux disease without esophagitis: Secondary | ICD-10-CM

## 2020-11-14 MED ORDER — PANTOPRAZOLE SODIUM 40 MG PO TBEC: 40.0000 mg | DELAYED_RELEASE_TABLET | Freq: Every day | ORAL | 3 refills | Status: DC

## 2020-11-14 NOTE — Telephone Encounter (Signed)
Rx sent 

## 2020-11-14 NOTE — Telephone Encounter (Signed)
Routing to [facility] Refill box. Pt would like Protonix 40 mg [facility].

## 2020-11-14 NOTE — Telephone Encounter (Signed)
Pt said she needed a prescription of her protonix sent to [facility].

## 2020-11-14 NOTE — Telephone Encounter (Signed)
Noted  

## 2020-11-27 ENCOUNTER — Encounter: Payer: Self-pay | Admitting: Family Medicine

## 2020-11-27 ENCOUNTER — Other Ambulatory Visit: Payer: Self-pay

## 2020-11-27 ENCOUNTER — Ambulatory Visit: Admitting: Family Medicine

## 2020-11-27 VITALS — BP 123/75 | HR 90 | Temp 97.0°F | Ht 59.0 in | Wt 141.0 lb

## 2020-11-27 DIAGNOSIS — E785 Hyperlipidemia, unspecified: Secondary | ICD-10-CM

## 2020-11-27 DIAGNOSIS — F17200 Nicotine dependence, unspecified, uncomplicated: Secondary | ICD-10-CM | POA: Diagnosis not present

## 2020-11-27 DIAGNOSIS — E663 Overweight: Secondary | ICD-10-CM | POA: Diagnosis not present

## 2020-11-27 DIAGNOSIS — I10 Essential (primary) hypertension: Secondary | ICD-10-CM

## 2020-11-27 DIAGNOSIS — R918 Other nonspecific abnormal finding of lung field: Secondary | ICD-10-CM

## 2020-11-27 DIAGNOSIS — R7303 Prediabetes: Secondary | ICD-10-CM

## 2020-11-27 NOTE — Progress Notes (Signed)
Jaime Murray     MRN: [id number]      DOB: [birth year]   HPI Jaime Murray is here for follow up and re-evaluation of chronic medical conditions, medication management and review of any available recent lab and radiology data.  Preventive health is updated, specifically  Cancer screening and Immunization.   Questions or concerns regarding consultations or procedures which the PT has had in the interim are  addressed. The PT denies any adverse reactions to current medications since the last visit.  There are no new concerns.  There are no specific complaints   ROS Denies recent fever or chills. Denies sinus pressure, nasal congestion, ear pain or sore throat. Denies chest congestion, productive cough or wheezing. Denies chest pains, palpitations and leg swelling Denies abdominal pain, nausea, vomiting,diarrhea or constipation.   Denies dysuria, frequency, hesitancy or incontinence. Denies joint pain, swelling and limitation in mobility. Denies headaches, seizures, numbness, or tingling. Denies depression, anxiety or insomnia. Denies skin break down or rash.   PE  BP 123/75 (BP Location: Left Arm, Patient Position: Sitting, Cuff Size: Normal)   Pulse 90   Temp (!) 97 F (36.1 C) (Temporal)   Ht 4\' 11"  (1.499 m)   Wt 141 lb (64 kg)   SpO2 98%   BMI 28.48 kg/m   Patient alert and oriented and in no cardiopulmonary distress.  HEENT: No facial asymmetry, EOMI,     Neck supple .  Chest: Clear to auscultation bilaterally.  CVS: S1, S2 no murmurs, no S3.Regular rate.  ABD: Soft non tender.   Ext: No edema  MS: Adequate ROM spine, shoulders, hips and knees.  Skin: Intact, no ulcerations or rash noted.  Psych: Good eye contact, normal affect. Memory intact not anxious or depressed appearing.  CNS: CN 2-12 intact, power,  normal throughout.no focal deficits noted.   Assessment & Plan  Hypertension goal BP (blood pressure) < 130/80 Controlled, no change in  medication DASH diet and commitment to daily physical activity for a minimum of 30 minutes discussed and encouraged, as a part of hypertension management. The importance of attaining a healthy weight is also discussed.  BP/Weight 11/27/2020 07/13/2020 06/30/2020 05/16/2020 03/21/2020 03/20/2020 0/63/0160  Systolic BP 109 323 557 322 025 - 427  Diastolic BP 75 80 88 93 80 - 78  Wt. (Lbs) 141 138.08 137 135 - 130 130  BMI 28.48 27.89 27.67 27.27 - 26.26 26.26       Hyperlipidemia LDL goal <100 Hyperlipidemia:Low fat diet discussed and encouraged.   Lipid Panel  Lab Results  Component Value Date   CHOL 229 (H) 07/04/2020   HDL 68 07/04/2020   LDLCALC 105 (H) 07/04/2020   TRIG 330 (H) 07/04/2020   CHOLHDL 3.4 07/04/2020     Updated lab needed at/ before next visit.   NICOTINE ADDICTION Asked:confirms currently smokes cigarettes, 1/daty Assess: willing to set a quit date, will quit by mid June Advise: needs to QUIT to reduce risk of cancer, cardio and cerebrovascular disease Assist: counseled for 5 minutes and literature provided Arrange: follow up in 2 to 4 months   Overweight (BMI 25.0-29.9) Deteriorated  Patient re-educated about  the importance of commitment to a  minimum of 150 minutes of exercise per week as able.  The importance of healthy food choices with portion control discussed, as well as eating regularly and within a 12 hour window most days. The need to choose "clean , green" food 50 to 75% of the time is  discussed, as well as to make water the primary drink and set a goal of 64 ounces water daily.    Weight /BMI 11/27/2020 07/13/2020 06/30/2020  WEIGHT 141 lb 138 lb 1.3 oz 137 lb  HEIGHT 4\' 11"  4\' 11"  4\' 11"   BMI 28.48 kg/m2 27.89 kg/m2 27.67 kg/m2      Prediabetes Patient educated about the importance of limiting  Carbohydrate intake , the need to commit to daily physical activity for a minimum of 30 minutes , and to commit weight loss. The fact that  changes in all these areas will reduce or eliminate all together the development of diabetes is stressed.   Diabetic Labs Latest Ref Rng & Units 07/04/2020 03/21/2020 03/16/2020 01/21/2020 01/16/2020  HbA1c 4.8 - 5.6 % 6.0(H) - 6.0(H) - -  Microalbumin mg/dL - - - - -  Micro/Creat Ratio 0.0 - 30.0 mg/g creat - - - - -  Chol 100 - 199 mg/dL 229(H) - - - -  HDL >39 mg/dL 68 - - - -  Calc LDL 0 - 99 mg/dL 105(H) - - - -  Triglycerides 0 - 149 mg/dL 330(H) - - - -  Creatinine 0.57 - 1.00 mg/dL 0.49(L) 0.62 0.50 0.54 0.54   BP/Weight 11/27/2020 07/13/2020 06/30/2020 05/16/2020 03/21/2020 03/20/2020 8/78/6767  Systolic BP 209 470 962 836 629 - 476  Diastolic BP 75 80 88 93 80 - 78  Wt. (Lbs) 141 138.08 137 135 - 130 130  BMI 28.48 27.89 27.67 27.27 - 26.26 26.26   Foot/eye exam completion dates Latest Ref Rng & Units 04/08/2019 09/01/2018  Eye Exam No Retinopathy - No Retinopathy  Foot Form Completion - Done -

## 2020-11-27 NOTE — Assessment & Plan Note (Signed)
Hyperlipidemia:Low fat diet discussed and encouraged.   Lipid Panel  Lab Results  Component Value Date   CHOL 229 (H) 07/04/2020   HDL 68 07/04/2020   LDLCALC 105 (H) 07/04/2020   TRIG 330 (H) 07/04/2020   CHOLHDL 3.4 07/04/2020     Updated lab needed at/ before next visit.

## 2020-11-27 NOTE — Assessment & Plan Note (Signed)
Patient educated about the importance of limiting  Carbohydrate intake , the need to commit to daily physical activity for a minimum of 30 minutes , and to commit weight loss. The fact that changes in all these areas will reduce or eliminate all together the development of diabetes is stressed.   Diabetic Labs Latest Ref Rng & Units 07/04/2020 03/21/2020 03/16/2020 01/21/2020 01/16/2020  HbA1c 4.8 - 5.6 % 6.0(H) - 6.0(H) - -  Microalbumin mg/dL - - - - -  Micro/Creat Ratio 0.0 - 30.0 mg/g creat - - - - -  Chol 100 - 199 mg/dL 229(H) - - - -  HDL >39 mg/dL 68 - - - -  Calc LDL 0 - 99 mg/dL 105(H) - - - -  Triglycerides 0 - 149 mg/dL 330(H) - - - -  Creatinine 0.57 - 1.00 mg/dL 0.49(L) 0.62 0.50 0.54 0.54   BP/Weight 11/27/2020 07/13/2020 06/30/2020 05/16/2020 03/21/2020 03/20/2020 3/47/4259  Systolic BP 563 875 643 329 518 - 841  Diastolic BP 75 80 88 93 80 - 78  Wt. (Lbs) 141 138.08 137 135 - 130 130  BMI 28.48 27.89 27.67 27.27 - 26.26 26.26   Foot/eye exam completion dates Latest Ref Rng & Units 04/08/2019 09/01/2018  Eye Exam No Retinopathy - No Retinopathy  Foot Form Completion - Done -

## 2020-11-27 NOTE — Assessment & Plan Note (Signed)
Deteriorated  Patient re-educated about  the importance of commitment to a  minimum of 150 minutes of exercise per week as able.  The importance of healthy food choices with portion control discussed, as well as eating regularly and within a 12 hour window most days. The need to choose "clean , green" food 50 to 75% of the time is discussed, as well as to make water the primary drink and set a goal of 64 ounces water daily.    Weight /BMI 11/27/2020 07/13/2020 06/30/2020  WEIGHT 141 lb 138 lb 1.3 oz 137 lb  HEIGHT 4\' 11"  4\' 11"  4\' 11"   BMI 28.48 kg/m2 27.89 kg/m2 27.67 kg/m2

## 2020-11-27 NOTE — Assessment & Plan Note (Signed)
Asked:confirms currently smokes cigarettes, 1/daty Assess: willing to set a quit date, will quit by mid June Advise: needs to QUIT to reduce risk of cancer, cardio and cerebrovascular disease Assist: counseled for 5 minutes and literature provided Arrange: follow up in 2 to 4 months

## 2020-11-27 NOTE — Assessment & Plan Note (Signed)
Controlled, no change in medication DASH diet and commitment to daily physical activity for a minimum of 30 minutes discussed and encouraged, as a part of hypertension management. The importance of attaining a healthy weight is also discussed.  BP/Weight 11/27/2020 07/13/2020 06/30/2020 05/16/2020 03/21/2020 03/20/2020 9/56/2130  Systolic BP 865 784 696 295 284 - 132  Diastolic BP 75 80 88 93 80 - 78  Wt. (Lbs) 141 138.08 137 135 - 130 130  BMI 28.48 27.89 27.67 27.27 - 26.26 26.26

## 2020-11-27 NOTE — Patient Instructions (Signed)
Annual exam with MD in October, call if you need me sooner  congrats on setting quit date by father's day  Info re helping to quit from nurse at discharge  It is important that you exercise regularly at least 30 minutes 5 times a week. If you develop chest pain, have severe difficulty breathing, or feel very tired, stop exercising immediately and seek medical attention   Please get labs today  You have an appt with GI which you need to keep   Weight loss goal of 5 pounds  Thanks for choosing [facility], we consider it a privelige to serve you.

## 2020-11-28 LAB — CMP14+EGFR
ALT: 20 IU/L (ref 0–32)
AST: 20 IU/L (ref 0–40)
Albumin/Globulin Ratio: 1.6 (ref 1.2–2.2)
Albumin: 4.1 g/dL (ref 3.8–4.8)
Alkaline Phosphatase: 95 IU/L (ref 44–121)
BUN/Creatinine Ratio: 13 (ref 12–28)
BUN: 8 mg/dL (ref 8–27)
Bilirubin Total: <0.2 mg/dL (ref 0.0–1.2)
CO2: 23 mmol/L (ref 20–29)
Calcium: 9 mg/dL (ref 8.7–10.3)
Chloride: 103 mmol/L (ref 96–106)
Creatinine, Ser: 0.6 mg/dL (ref 0.57–1.00)
Globulin, Total: 2.5 g/dL (ref 1.5–4.5)
Glucose: 110 mg/dL — ABNORMAL HIGH (ref 65–99)
Potassium: 3.8 mmol/L (ref 3.5–5.2)
Sodium: 144 mmol/L (ref 134–144)
Total Protein: 6.6 g/dL (ref 6.0–8.5)
eGFR: 101 mL/min/{1.73_m2} (ref >59)

## 2020-11-28 LAB — LIPID PANEL
Chol/HDL Ratio: 3 ratio (ref 0.0–4.4)
Cholesterol, Total: 199 mg/dL (ref 100–199)
HDL: 66 mg/dL (ref >39)
LDL Chol Calc (NIH): 112 mg/dL — ABNORMAL HIGH (ref 0–99)
Triglycerides: 120 mg/dL (ref 0–149)
VLDL Cholesterol Cal: 21 mg/dL (ref 5–40)

## 2020-11-28 LAB — HEMOGLOBIN A1C
Est. average glucose Bld gHb Est-mCnc: 131 mg/dL
Hgb A1c MFr Bld: 6.2 % — ABNORMAL HIGH (ref 4.8–5.6)

## 2020-11-28 LAB — VITAMIN D 25 HYDROXY (VIT D DEFICIENCY, FRACTURES): Vit D, 25-Hydroxy: 23.4 ng/mL — ABNORMAL LOW (ref 30.0–100.0)

## 2020-11-28 LAB — TSH: TSH: 4.21 u[IU]/mL (ref 0.450–4.500)

## 2020-12-25 ENCOUNTER — Other Ambulatory Visit: Payer: Self-pay | Admitting: Family Medicine

## 2021-01-04 ENCOUNTER — Ambulatory Visit: Admitting: Gastroenterology

## 2021-03-19 ENCOUNTER — Other Ambulatory Visit: Payer: Self-pay | Admitting: Family Medicine

## 2021-04-13 ENCOUNTER — Other Ambulatory Visit: Payer: Self-pay | Admitting: Family Medicine

## 2021-04-30 ENCOUNTER — Encounter: Admitting: Family Medicine

## 2021-05-18 ENCOUNTER — Encounter: Admitting: Family Medicine

## 2021-05-22 ENCOUNTER — Other Ambulatory Visit: Payer: Self-pay

## 2021-05-22 ENCOUNTER — Ambulatory Visit (INDEPENDENT_AMBULATORY_CARE_PROVIDER_SITE_OTHER)

## 2021-05-22 DIAGNOSIS — Z23 Encounter for immunization: Secondary | ICD-10-CM | POA: Diagnosis not present

## 2021-06-15 ENCOUNTER — Other Ambulatory Visit: Payer: Self-pay | Admitting: Family Medicine

## 2021-06-22 ENCOUNTER — Other Ambulatory Visit: Payer: Self-pay | Admitting: Family Medicine

## 2021-08-08 ENCOUNTER — Other Ambulatory Visit: Payer: Self-pay

## 2021-08-08 ENCOUNTER — Ambulatory Visit (INDEPENDENT_AMBULATORY_CARE_PROVIDER_SITE_OTHER): Admitting: Family Medicine

## 2021-08-08 ENCOUNTER — Encounter: Payer: Self-pay | Admitting: Family Medicine

## 2021-08-08 VITALS — BP 112/75 | HR 81 | Ht 61.0 in | Wt 139.0 lb

## 2021-08-08 DIAGNOSIS — Z0000 Encounter for general adult medical examination without abnormal findings: Secondary | ICD-10-CM

## 2021-08-08 DIAGNOSIS — Z800 Family history of malignant neoplasm of digestive organs: Secondary | ICD-10-CM | POA: Diagnosis not present

## 2021-08-08 DIAGNOSIS — D126 Benign neoplasm of colon, unspecified: Secondary | ICD-10-CM

## 2021-08-08 DIAGNOSIS — E559 Vitamin D deficiency, unspecified: Secondary | ICD-10-CM

## 2021-08-08 DIAGNOSIS — E785 Hyperlipidemia, unspecified: Secondary | ICD-10-CM

## 2021-08-08 DIAGNOSIS — R7303 Prediabetes: Secondary | ICD-10-CM

## 2021-08-08 DIAGNOSIS — Z23 Encounter for immunization: Secondary | ICD-10-CM | POA: Diagnosis not present

## 2021-08-08 DIAGNOSIS — I10 Essential (primary) hypertension: Secondary | ICD-10-CM

## 2021-08-08 DIAGNOSIS — Z87891 Personal history of nicotine dependence: Secondary | ICD-10-CM

## 2021-08-08 DIAGNOSIS — D509 Iron deficiency anemia, unspecified: Secondary | ICD-10-CM

## 2021-08-08 DIAGNOSIS — Z1231 Encounter for screening mammogram for malignant neoplasm of breast: Secondary | ICD-10-CM

## 2021-08-08 LAB — POCT GLYCOSYLATED HEMOGLOBIN (HGB A1C): HbA1c, POC (prediabetic range): 5.9 % (ref 5.7–6.4)

## 2021-08-08 MED ORDER — FLUTICASONE PROPIONATE 50 MCG/ACT NA SUSP: 2.0000 | Freq: Every day | NASAL | 1 refills | Status: DC

## 2021-08-08 NOTE — Patient Instructions (Addendum)
F/U in September with pap, call if you need me sooner  Shingrix #2 today  We will schedule screening chest scan as you have a 21 pack year smoking history  Blood sugar has improved, congrats  Labs today, CBC, iron and ferritin, lipid, cmp and EGFr, TSH and vit D  Please schedule mammogram at checkout  You are referred for colonoscopy, important that you  get this  Congrats on staying nicotine free  Increase number of days of exercise from 3 to 5 please  Thanks for choosing [facility], we consider it a privelige to serve you.

## 2021-08-08 NOTE — Assessment & Plan Note (Signed)
Needs rept colonoscopy

## 2021-08-08 NOTE — Progress Notes (Signed)
° ° °  GAO MITNICK     MRN: [id number]      DOB: [birth year]  HPI: Patient is in for annual physical exam. No other health concerns are expressed or addressed at the visit. Recent labs,  are reviewed. Immunization is reviewed , and  updated if needed.   PE: BP 112/75    Pulse 81    Ht 5\' 1"  (1.549 m)    Wt 139 lb (63 kg)    SpO2 94%    BMI 26.26 kg/m  Pleasant  female, alert and oriented x 3, in no cardio-pulmonary distress. Afebrile. HEENT No facial trauma or asymetry. Sinuses non tender.  Extra occullar muscles intact.. External ears normal, . Neck: supple, no adenopathy,JVD or thyromegaly.No bruits.  Chest: Clear to ascultation bilaterally.No crackles or wheezes. Non tender to palpation    Cardiovascular system; Heart sounds normal,  S1 and  S2 ,no S3.  No murmur, or thrill. Apical beat not displaced Peripheral pulses normal.  Abdomen: Soft, non tender, no organomegaly or masses. No bruits. Bowel sounds normal. No guarding, tenderness or rebound.     Musculoskeletal exam: Full ROM of spine, hips , shoulders and knees. No deformity ,swelling or crepitus noted. No muscle wasting or atrophy.   Neurologic: Cranial nerves 2 to 12 intact. Power, tone ,sensation and reflexes normal throughout. No disturbance in gait. No tremor.  Skin: Intact, no ulceration, erythema , scaling or rash noted. Pigmentation normal throughout  Psych; Normal mood and affect. Judgement and concentration normal   Assessment & Plan:  ADENOMATOUS COLONIC POLYP Needs rept colonoscopy  Prediabetes Patient educated about the importance of limiting  Carbohydrate intake , the need to commit to daily physical activity for a minimum of 30 minutes , and to commit weight loss. The fact that changes in all these areas will reduce or eliminate all together the development of diabetes is stressed.   Diabetic Labs Latest Ref Rng & Units 08/08/2021 11/27/2020 07/04/2020 03/21/2020 03/16/2020   HbA1c 5.7 - 6.4 % 5.9 6.2(H) 6.0(H) - 6.0(H)  Microalbumin mg/dL - - - - -  Micro/Creat Ratio 0.0 - 30.0 mg/g creat - - - - -  Chol 100 - 199 mg/dL 175 199 229(H) - -  HDL >39 mg/dL 88 66 68 - -  Calc LDL 0 - 99 mg/dL 75 112(H) 105(H) - -  Triglycerides 0 - 149 mg/dL 61 120 330(H) - -  Creatinine 0.57 - 1.00 mg/dL 0.58 0.60 0.49(L) 0.62 0.50   BP/Weight 08/08/2021 11/27/2020 07/13/2020 06/30/2020 05/16/2020 03/21/2020 6/38/7564  Systolic BP 332 951 884 166 063 016 -  Diastolic BP 75 75 80 88 93 80 -  Wt. (Lbs) 139 141 138.08 137 135 - 130  BMI 26.26 28.48 27.89 27.67 27.27 - 26.26   Foot/eye exam completion dates Latest Ref Rng & Units 04/08/2019 09/01/2018  Eye Exam No Retinopathy - No Retinopathy  Foot Form Completion - Done -   Improved   H/O nicotine dependence Over 20 pack year history, needs annual screening  Annual physical exam Annual exam as documented. Counseling done  re healthy lifestyle involving commitment to 150 minutes exercise per week, heart healthy diet, and attaining healthy weight.The importance of adequate sleep also discussed. Regular seat belt use and home safety, is also discussed. Changes in health habits are decided on by the patient with goals and time frames  set for achieving them. Immunization and cancer screening needs are specifically addressed at this visit.

## 2021-08-09 LAB — FERRITIN: Ferritin: 89 ng/mL (ref 15–150)

## 2021-08-09 LAB — CMP14+EGFR
ALT: 27 IU/L (ref 0–32)
AST: 23 IU/L (ref 0–40)
Albumin/Globulin Ratio: 1.7 (ref 1.2–2.2)
Albumin: 4 g/dL (ref 3.8–4.8)
Alkaline Phosphatase: 101 IU/L (ref 44–121)
BUN/Creatinine Ratio: 12 (ref 12–28)
BUN: 7 mg/dL — ABNORMAL LOW (ref 8–27)
Bilirubin Total: 0.2 mg/dL (ref 0.0–1.2)
CO2: 22 mmol/L (ref 20–29)
Calcium: 9.1 mg/dL (ref 8.7–10.3)
Chloride: 103 mmol/L (ref 96–106)
Creatinine, Ser: 0.58 mg/dL (ref 0.57–1.00)
Globulin, Total: 2.3 g/dL (ref 1.5–4.5)
Glucose: 116 mg/dL — ABNORMAL HIGH (ref 70–99)
Potassium: 4.1 mmol/L (ref 3.5–5.2)
Sodium: 140 mmol/L (ref 134–144)
Total Protein: 6.3 g/dL (ref 6.0–8.5)
eGFR: 102 mL/min/{1.73_m2} (ref >59)

## 2021-08-09 LAB — LIPID PANEL
Chol/HDL Ratio: 2 ratio (ref 0.0–4.4)
Cholesterol, Total: 175 mg/dL (ref 100–199)
HDL: 88 mg/dL (ref >39)
LDL Chol Calc (NIH): 75 mg/dL (ref 0–99)
Triglycerides: 61 mg/dL (ref 0–149)
VLDL Cholesterol Cal: 12 mg/dL (ref 5–40)

## 2021-08-09 LAB — VITAMIN D 25 HYDROXY (VIT D DEFICIENCY, FRACTURES): Vit D, 25-Hydroxy: 23.6 ng/mL — ABNORMAL LOW (ref 30.0–100.0)

## 2021-08-09 LAB — IRON: Iron: 47 ug/dL (ref 27–139)

## 2021-08-09 LAB — CBC
Hematocrit: 39.3 % (ref 34.0–46.6)
Hemoglobin: 13.1 g/dL (ref 11.1–15.9)
MCH: 27.8 pg (ref 26.6–33.0)
MCHC: 33.3 g/dL (ref 31.5–35.7)
MCV: 83 fL (ref 79–97)
Platelets: 242 10*3/uL (ref 150–450)
RBC: 4.71 x10E6/uL (ref 3.77–5.28)
RDW: 13.2 % (ref 11.7–15.4)
WBC: 5.4 10*3/uL (ref 3.4–10.8)

## 2021-08-09 LAB — TSH: TSH: 2.05 u[IU]/mL (ref 0.450–4.500)

## 2021-08-11 ENCOUNTER — Encounter: Payer: Self-pay | Admitting: Family Medicine

## 2021-08-11 NOTE — Assessment & Plan Note (Signed)
Patient educated about the importance of limiting  Carbohydrate intake , the need to commit to daily physical activity for a minimum of 30 minutes , and to commit weight loss. The fact that changes in all these areas will reduce or eliminate all together the development of diabetes is stressed.   Diabetic Labs Latest Ref Rng & Units 08/08/2021 11/27/2020 07/04/2020 03/21/2020 03/16/2020  HbA1c 5.7 - 6.4 % 5.9 6.2(H) 6.0(H) - 6.0(H)  Microalbumin mg/dL - - - - -  Micro/Creat Ratio 0.0 - 30.0 mg/g creat - - - - -  Chol 100 - 199 mg/dL 175 199 229(H) - -  HDL >39 mg/dL 88 66 68 - -  Calc LDL 0 - 99 mg/dL 75 112(H) 105(H) - -  Triglycerides 0 - 149 mg/dL 61 120 330(H) - -  Creatinine 0.57 - 1.00 mg/dL 0.58 0.60 0.49(L) 0.62 0.50   BP/Weight 08/08/2021 11/27/2020 07/13/2020 06/30/2020 05/16/2020 03/21/2020 5/62/1308  Systolic BP 657 846 962 952 841 324 -  Diastolic BP 75 75 80 88 93 80 -  Wt. (Lbs) 139 141 138.08 137 135 - 130  BMI 26.26 28.48 27.89 27.67 27.27 - 26.26   Foot/eye exam completion dates Latest Ref Rng & Units 04/08/2019 09/01/2018  Eye Exam No Retinopathy - No Retinopathy  Foot Form Completion - Done -   Improved

## 2021-08-11 NOTE — Assessment & Plan Note (Signed)
Over 20 pack year history, needs annual screening

## 2021-08-11 NOTE — Assessment & Plan Note (Signed)

## 2021-08-13 ENCOUNTER — Encounter: Payer: Self-pay | Admitting: Internal Medicine

## 2021-09-28 ENCOUNTER — Ambulatory Visit (HOSPITAL_COMMUNITY): Admission: RE | Admit: 2021-09-28 | Source: Ambulatory Visit

## 2021-10-01 ENCOUNTER — Other Ambulatory Visit: Payer: Self-pay

## 2021-10-01 ENCOUNTER — Telehealth: Payer: Self-pay | Admitting: *Deleted

## 2021-10-01 ENCOUNTER — Ambulatory Visit (INDEPENDENT_AMBULATORY_CARE_PROVIDER_SITE_OTHER): Payer: Self-pay | Admitting: *Deleted

## 2021-10-01 VITALS — Ht 59.0 in | Wt 146.0 lb

## 2021-10-01 DIAGNOSIS — K219 Gastro-esophageal reflux disease without esophagitis: Secondary | ICD-10-CM

## 2021-10-01 DIAGNOSIS — Z86010 Personal history of colonic polyps: Secondary | ICD-10-CM

## 2021-10-01 MED ORDER — PANTOPRAZOLE SODIUM 40 MG PO TBEC: 40.0000 mg | DELAYED_RELEASE_TABLET | Freq: Two times a day (BID) | ORAL | 3 refills | Status: DC

## 2021-10-01 NOTE — Telephone Encounter (Signed)
Spoke to pt.  She was my nurse triage today.  She informed me that she needs RX for Pantoprazole 40 mg.  Says she takes it twice daily.  Uses [facility].  I am sending her prep kit to [facility] so you may have to change pharmacy.  Last ov 06/30/2020.  Will she need ov for that?  She is being triaged today only for colonoscopy. ?

## 2021-10-01 NOTE — Progress Notes (Signed)
Gastroenterology Pre-Procedure Review  Request Date: 10/01/2021 Requesting Physician: Dr. Tula Nakayama, 5 year recall, Last TCS done 12/06/2015 by Dr. Gala Romney, normal colon, hx of polyps, family hx of colon cancer (mother)  PATIENT REVIEW QUESTIONS: The patient responded to the following health history questions as indicated:    1. Diabetes Melitis: no 2. Joint replacements in the past 12 months: no 3. Major health problems in the past 3 months: no 4. Has an artificial valve or MVP: no 5. Has a defibrillator: no 6. Has been advised in past to take antibiotics in advance of a procedure like teeth cleaning: no 7. Family history of colon cancer: yes, mother: age 81's  8. Alcohol Use: no 9. Illicit drug Use: no 10. History of sleep apnea: no  11. History of coronary artery or other vascular stents placed within the last 12 months: no 12. History of any prior anesthesia complications: no 13. Body mass index is 29.49 kg/m.    MEDICATIONS & ALLERGIES:    Patient reports the following regarding taking any blood thinners:   Plavix? no Aspirin? no Coumadin? no Brilinta? no Xarelto? no Eliquis? no Pradaxa? no Savaysa? no Effient? no  Patient confirms/reports the following medications:  Current Outpatient Medications  Medication Sig Dispense Refill   amLODipine (NORVASC) 5 MG tablet TAKE 1 TABLET DAILY 90 tablet 3   cetirizine (ZYRTEC) 10 MG tablet TAKE 1 TABLET DAILY (Patient taking differently: as needed.) 30 tablet 0   cloNIDine (CATAPRES) 0.1 MG tablet TAKE 1 TABLET AT BEDTIME 90 tablet 3   COD LIVER OIL PO Take by mouth daily at 6 (six) AM.     fluticasone (FLONASE) 50 MCG/ACT nasal spray Place 2 sprays into both nostrils daily. 48 g 1   folic acid (FOLVITE) 1 MG tablet Take 3 mg by mouth daily.     hydroxypropyl methylcellulose / hypromellose (ISOPTO TEARS / GONIOVISC) 2.5 % ophthalmic solution Place 1 drop into both eyes 3 (three) times daily as needed for dry eyes.      lovastatin (MEVACOR) 40 MG tablet TAKE 1 TABLET AT BEDTIME 90 tablet 3   Methotrexate Sodium (METHOTREXATE, PF,) 50 MG/2ML injection Inject 0.9 mLs into the muscle once a week. Every Sunday     pantoprazole (PROTONIX) 40 MG tablet Take 1 tablet (40 mg total) by mouth daily. 30 minutes before breakfast (Patient taking differently: Take 40 mg by mouth 2 (two) times daily. 30 minutes before breakfast) 90 tablet 3   Probiotic Product (PROBIOTIC DAILY) CAPS Take 1 capsule by mouth daily at 6 (six) AM.     sertraline (ZOLOFT) 50 MG tablet TAKE 1 TABLET DAILY 90 tablet 3   VITAMIN D PO Take by mouth daily at 6 (six) AM.     VITAMIN E PO Take by mouth daily at 6 (six) AM.     No current facility-administered medications for this visit.    Patient confirms/reports the following allergies:  Allergies  Allergen Reactions   Gabapentin Palpitations    No orders of the defined types were placed in this encounter.   AUTHORIZATION INFORMATION Primary Insurance: [employer] Fare #: [id number] Pre-Cert / Josem Kaufmann required: No, not required  SCHEDULE INFORMATION: Procedure has been scheduled as follows:  Date: , Time:   Location: [facility] with Dr. Gala Romney  This Gastroenterology Pre-Precedure Review Form is being routed to the following provider(s): Aliene Altes, PA-C

## 2021-10-01 NOTE — Telephone Encounter (Signed)
I refilled it, but she needs an office visit before end of year. Can be virtual. If possible, have her only take PPI ONCE a day if tolerated.  ?

## 2021-10-01 NOTE — Addendum Note (Signed)
Addended by: Annitta Needs on: 10/01/2021 02:48 PM ? ? Modules accepted: Orders ? ?

## 2021-10-01 NOTE — Progress Notes (Signed)
Okay to schedule with propofol with Dr. Gala Romney.  ASA 2. ?

## 2021-10-02 ENCOUNTER — Encounter: Payer: Self-pay | Admitting: *Deleted

## 2021-10-02 MED ORDER — PEG 3350-KCL-NA BICARB-NACL 420 G PO SOLR: 4000.0000 mL | Freq: Once | ORAL | 0 refills | Status: AC

## 2021-10-02 NOTE — Addendum Note (Signed)
Addended by: Metro Kung on: 10/02/2021 12:12 PM ? ? Modules accepted: Orders ? ?

## 2021-10-02 NOTE — Progress Notes (Signed)
Spoke to pt. Scheduled procedure for 11/12/2021 at 8:30, arrival at 7:00 at [facility].  Reviewed prep instructions with pt by phone.  Pt aware that I sent RX to pharmacy.  Pt aware to get required OTC items.  Pt aware that I am mailing out instructions.  Confirmed mailing address with pt. ?

## 2021-10-02 NOTE — Telephone Encounter (Signed)
Pt informed.  Scheduled follow up virtual visit for 01/01/2022 at 3:00.   ? ?

## 2021-10-08 ENCOUNTER — Ambulatory Visit (HOSPITAL_COMMUNITY)

## 2021-10-23 ENCOUNTER — Encounter: Payer: Self-pay | Admitting: Internal Medicine

## 2021-11-12 ENCOUNTER — Ambulatory Visit (HOSPITAL_COMMUNITY): Admitting: Anesthesiology

## 2021-11-12 ENCOUNTER — Other Ambulatory Visit: Payer: Self-pay

## 2021-11-12 ENCOUNTER — Ambulatory Visit (HOSPITAL_COMMUNITY)
Admission: RE | Admit: 2021-11-12 | Discharge: 2021-11-12 | Disposition: A | Discharge status: Home / Self Care | Attending: Internal Medicine | Admitting: Internal Medicine

## 2021-11-12 ENCOUNTER — Ambulatory Visit (HOSPITAL_BASED_OUTPATIENT_CLINIC_OR_DEPARTMENT_OTHER): Admitting: Anesthesiology

## 2021-11-12 ENCOUNTER — Encounter (HOSPITAL_COMMUNITY)
Admission: RE | Disposition: A | Discharge status: Home / Self Care | Payer: Self-pay | Source: Home / Self Care | Attending: Internal Medicine

## 2021-11-12 ENCOUNTER — Encounter (HOSPITAL_COMMUNITY): Payer: Self-pay | Admitting: Internal Medicine

## 2021-11-12 DIAGNOSIS — D125 Benign neoplasm of sigmoid colon: Secondary | ICD-10-CM

## 2021-11-12 DIAGNOSIS — K635 Polyp of colon: Secondary | ICD-10-CM | POA: Diagnosis not present

## 2021-11-12 DIAGNOSIS — K219 Gastro-esophageal reflux disease without esophagitis: Secondary | ICD-10-CM | POA: Diagnosis not present

## 2021-11-12 DIAGNOSIS — Z800 Family history of malignant neoplasm of digestive organs: Secondary | ICD-10-CM | POA: Insufficient documentation

## 2021-11-12 DIAGNOSIS — Z8719 Personal history of other diseases of the digestive system: Secondary | ICD-10-CM | POA: Diagnosis not present

## 2021-11-12 DIAGNOSIS — Z86010 Personal history of colonic polyps: Secondary | ICD-10-CM

## 2021-11-12 DIAGNOSIS — Z1211 Encounter for screening for malignant neoplasm of colon: Secondary | ICD-10-CM | POA: Diagnosis present

## 2021-11-12 DIAGNOSIS — Q438 Other specified congenital malformations of intestine: Secondary | ICD-10-CM | POA: Diagnosis not present

## 2021-11-12 DIAGNOSIS — Z87891 Personal history of nicotine dependence: Secondary | ICD-10-CM | POA: Insufficient documentation

## 2021-11-12 DIAGNOSIS — I10 Essential (primary) hypertension: Secondary | ICD-10-CM | POA: Diagnosis not present

## 2021-11-12 HISTORY — PX: POLYPECTOMY: SHX5525

## 2021-11-12 HISTORY — PX: COLONOSCOPY WITH PROPOFOL: SHX5780

## 2021-11-12 HISTORY — PX: BIOPSY: SHX5522

## 2021-11-12 SURGERY — COLONOSCOPY WITH PROPOFOL
Anesthesia: General

## 2021-11-12 MED ORDER — PROPOFOL 1000 MG/100ML IV EMUL
INTRAVENOUS | Status: AC
  Filled 2021-11-12: qty 100

## 2021-11-12 MED ORDER — LACTATED RINGERS IV SOLN: INTRAVENOUS | Status: DC

## 2021-11-12 MED ORDER — PHENYLEPHRINE HCL (PRESSORS) 10 MG/ML IV SOLN
INTRAVENOUS | Status: DC | PRN
  Administered 2021-11-12: 160 ug via INTRAVENOUS
  Administered 2021-11-12: 80 ug via INTRAVENOUS

## 2021-11-12 MED ORDER — PHENYLEPHRINE 80 MCG/ML (10ML) SYRINGE FOR IV PUSH (FOR BLOOD PRESSURE SUPPORT)
PREFILLED_SYRINGE | INTRAVENOUS | Status: AC
  Filled 2021-11-12: qty 10

## 2021-11-12 MED ORDER — PROPOFOL 500 MG/50ML IV EMUL
INTRAVENOUS | Status: DC | PRN
  Administered 2021-11-12: 150 ug/kg/min via INTRAVENOUS

## 2021-11-12 MED ORDER — PROPOFOL 10 MG/ML IV BOLUS
INTRAVENOUS | Status: DC | PRN
  Administered 2021-11-12: 100 mg via INTRAVENOUS
  Administered 2021-11-12: 30 mg via INTRAVENOUS
  Administered 2021-11-12 (×2): 40 mg via INTRAVENOUS

## 2021-11-12 NOTE — Op Note (Signed)
[facility] ?Patient Name: Jaime Murray ?Procedure Date: 11/12/2021 8:11 AM ?MRN: [id number] ?Date of Birth: [birth year] ?Attending MD: Norvel Richards , MD ?CSN: [id number] ?Age: 66 ?Admit Type: Outpatient ?Procedure:                Colonoscopy ?Indications:              High risk colon cancer surveillance: Personal  ?                          history of colonic polyps ?Providers:                Norvel Richards, MD, Janeece Riggers, RN, Eugene Garnet  ?                          Shanon Brow, Technician ?Referring MD:              ?Medicines:                Propofol per Anesthesia ?Complications:            No immediate complications. ?Estimated Blood Loss:     Estimated blood loss was minimal. ?Procedure:                Pre-Anesthesia Assessment: ?                          - Prior to the procedure, a History and Physical  ?                          was performed, and patient medications and  ?                          allergies were reviewed. The patient's tolerance of  ?                          previous anesthesia was also reviewed. The risks  ?                          and benefits of the procedure and the sedation  ?                          options and risks were discussed with the patient.  ?                          All questions were answered, and informed consent  ?                          was obtained. Prior Anticoagulants: The patient has  ?                          taken no previous anticoagulant or antiplatelet  ?                          agents. ASA Grade Assessment: II - A patient with  ?  mild systemic disease. After reviewing the risks  ?                          and benefits, the patient was deemed in  ?                          satisfactory condition to undergo the procedure. ?                          After obtaining informed consent, the colonoscope  ?                          was passed under direct vision. Throughout the  ?                          procedure, the  patient's blood pressure, pulse, and  ?                          oxygen saturations were monitored continuously. The  ?                          [id number]-HQ190L ([id number]) scope was introduced through  ?                          the anus and advanced to the the cecum, identified  ?                          by appendiceal orifice and ileocecal valve. The  ?                          ileocecal valve, appendiceal orifice, and rectum  ?                          were photographed. ?Scope In: 8:34:27 AM ?Scope Out: 9:09:21 AM ?Total Procedure Duration: 0 hours 34 minutes 54 seconds  ?Findings: ?     The perianal and digital rectal examinations were normal. Redundant left  ?     colon. Could not negotiate pediatric colonoscope through this segment.  ?     Obtained the ultraslim scope was able to advance around to the cecum.  ?     Somewhat redundant right colon. Changing of the patient's position and  ?     external abdominal pressure required to reach the cecum. Patient had (1)  ?     5 mm sigmoid polyp removed with cold snare. Surrounding ambulations not  ?     manipulated. Retroflexion demonstrated no abnormalities ?Impression:               - Noncompliant and redundant colon. Small sigmoid  ?                          polyp removed as described above; . ?Moderate Sedation: ?     Moderate (conscious) sedation was personally administered by an  ?     anesthesia professional. The following parameters were monitored: oxygen  ?     saturation, heart rate, blood pressure, respiratory rate, EKG, adequacy  ?  of pulmonary ventilation, and response to care. ?Recommendation:           - Patient has a contact number available for  ?                          emergencies. The signs and symptoms of potential  ?                          delayed complications were discussed with the  ?                          patient. Return to normal activities tomorrow.  ?                          Written discharge instructions were provided to the  ?                           patient. ?                          - Advance diet as tolerated. ?                          - Repeat colonoscopy date to be determined after  ?                          pending pathology results are reviewed for  ?                          surveillance. ?                          - Return to GI office (date not yet determined). ?Procedure Code(s):        --- Professional --- ?                          [phone], Colonoscopy, flexible; diagnostic, including  ?                          collection of specimen(s) by brushing or washing,  ?                          when performed (separate procedure) ?Diagnosis Code(s):        --- Professional --- ?                          Z86.010, Personal history of colonic polyps ?CPT copyright 2019 American Medical Association. All rights reserved. ?The codes documented in this report are preliminary and upon coder review may  ?be revised to meet current compliance requirements. ?Cristopher Estimable. Arias Weinert, MD ?Norvel Richards, MD ?11/12/2021 9:59:23 AM ?This report has been signed electronically. ?Number of Addenda: 0 ?

## 2021-11-12 NOTE — Anesthesia Postprocedure Evaluation (Signed)
Anesthesia Post Note ? ?Patient: Jaime Murray ? ?Procedure(s) Performed: COLONOSCOPY WITH PROPOFOL ?POLYPECTOMY ?BIOPSY ? ?Patient location during evaluation: Endoscopy ?Anesthesia Type: General ?Level of consciousness: awake and alert and oriented ?Pain management: pain level controlled ?Vital Signs Assessment: post-procedure vital signs reviewed and stable ?Respiratory status: spontaneous breathing, nonlabored ventilation and respiratory function stable ?Cardiovascular status: blood pressure returned to baseline and stable ?Postop Assessment: no apparent nausea or vomiting ?Anesthetic complications: no ? ? ?No notable events documented. ? ? ?Last Vitals:  ?Vitals:  ? 11/12/21 0719 11/12/21 0913  ?BP: (!) 128/93 100/60  ?Pulse: 85 83  ?Resp: 13 19  ?Temp:  37 ?C  ?SpO2: 97% 95%  ?  ?Last Pain:  ?Vitals:  ? 11/12/21 0913  ?TempSrc: Oral  ?PainSc: 0-No pain  ? ? ?  ?  ?  ?  ?  ?  ? ?Robel Wuertz C Kirbie Stodghill ? ? ? ? ?

## 2021-11-12 NOTE — Discharge Instructions (Addendum)
?  Colonoscopy ?Discharge Instructions ? ?Read the instructions outlined below and refer to this sheet in the next few weeks. These discharge instructions provide you with general information on caring for yourself after you leave the hospital. Your doctor may also give you specific instructions. While your treatment has been planned according to the most current medical practices available, unavoidable complications occasionally occur. If you have any problems or questions after discharge, call Dr. Gala Romney at [phone]. ?ACTIVITY ?You may resume your regular activity, but move at a slower pace for the next 24 hours.  ?Take frequent rest periods for the next 24 hours.  ?Walking will help get rid of the air and reduce the bloated feeling in your belly (abdomen).  ?No driving for 24 hours (because of the medicine (anesthesia) used during the test).   ?Do not sign any important legal documents or operate any machinery for 24 hours (because of the anesthesia used during the test).  ?NUTRITION ?Drink plenty of fluids.  ?You may resume your normal diet as instructed by your doctor.  ?Begin with a light meal and progress to your normal diet. Heavy or fried foods are harder to digest and may make you feel sick to your stomach (nauseated).  ?Avoid alcoholic beverages for 24 hours or as instructed.  ?MEDICATIONS ?You may resume your normal medications unless your doctor tells you otherwise.  ?WHAT YOU CAN EXPECT TODAY ?Some feelings of bloating in the abdomen.  ?Passage of more gas than usual.  ?Spotting of blood in your stool or on the toilet paper.  ?IF YOU HAD POLYPS REMOVED DURING THE COLONOSCOPY: ?No aspirin products for 7 days or as instructed.  ?No alcohol for 7 days or as instructed.  ?Eat a soft diet for the next 24 hours.  ?FINDING OUT THE RESULTS OF YOUR TEST ?Not all test results are available during your visit. If your test results are not back during the visit, make an appointment with your caregiver to find out the  results. Do not assume everything is normal if you have not heard from your caregiver or the medical facility. It is important for you to follow up on all of your test results.  ?SEEK IMMEDIATE MEDICAL ATTENTION IF: ?You have more than a spotting of blood in your stool.  ?Your belly is swollen (abdominal distention).  ?You are nauseated or vomiting.  ?You have a temperature over 101.  ?You have abdominal pain or discomfort that is severe or gets worse throughout the day.   ? ? ?1 small polyp removed your colon today ? ?Further recommendations to follow pending review of pathology report ? ?At patient request, called [facility] at [phone] to reach ?

## 2021-11-12 NOTE — Transfer of Care (Signed)
Immediate Anesthesia Transfer of Care Note ? ?Patient: Jaime Murray ? ?Procedure(s) Performed: COLONOSCOPY WITH PROPOFOL ?POLYPECTOMY ?BIOPSY ? ?Patient Location: PACU ? ?Anesthesia Type:General ? ?Level of Consciousness: awake, alert , oriented and patient cooperative ? ?Airway & Oxygen Therapy: Patient Spontanous Breathing ? ?Post-op Assessment: Report given to RN, Post -op Vital signs reviewed and stable and Patient moving all extremities X 4 ? ?Post vital signs: Reviewed and stable ? ?Last Vitals:  ?Vitals Value Taken Time  ?BP    ?Temp    ?Pulse    ?Resp    ?SpO2    ? ? ?Last Pain:  ?Vitals:  ? 11/12/21 0831  ?TempSrc:   ?PainSc: 0-No pain  ?   ? ?Patients Stated Pain Goal: 8 (11/12/21 0717) ? ?Complications: No notable events documented. ?

## 2021-11-12 NOTE — Anesthesia Preprocedure Evaluation (Addendum)
Anesthesia Evaluation  ?Patient identified by MRN, date of birth, ID band ?Patient awake ? ? ? ?Reviewed: ?Allergy & Precautions, NPO status , Patient's Chart, lab work & pertinent test results ? ?Airway ?Mallampati: II ? ?TM Distance: >3 FB ?Neck ROM: Full ? ? ? Dental ? ?(+) Dental Advisory Given, Upper Dentures, Missing ?  ?Pulmonary ?neg pulmonary ROS, former smoker,  ?  ?Pulmonary exam normal ?breath sounds clear to auscultation ? ? ? ? ? ? Cardiovascular ?Exercise Tolerance: Good ?hypertension, Pt. on medications ?Normal cardiovascular exam ?Rhythm:Regular Rate:Normal ? ? ?  ?Neuro/Psych ?PSYCHIATRIC DISORDERS Anxiety Depression negative neurological ROS ?   ? GI/Hepatic ?Neg liver ROS, hiatal hernia (XI ROBOTIC ASSISTED PARAESOPHAGEAL HERNIA REPAIR), GERD  Medicated and Controlled,  ?Endo/Other  ?negative endocrine ROS ? Renal/GU ?negative Renal ROS  ?negative genitourinary ?  ?Musculoskeletal ? ?(+) Arthritis , Osteoarthritis and Rheumatoid disorders,   ? Abdominal ?  ?Peds ?negative pediatric ROS ?(+)  Hematology ? ?(+) Blood dyscrasia, anemia ,   ?Anesthesia Other Findings ? ? Reproductive/Obstetrics ?negative OB ROS ? ?  ? ? ? ? ? ? ? ? ? ? ? ? ? ?  ?  ? ? ? ? ? ? ?Anesthesia Physical ?Anesthesia Plan ? ?ASA: 2 ? ?Anesthesia Plan: General  ? ?Post-op Pain Management: Minimal or no pain anticipated  ? ?Induction: Intravenous ? ?PONV Risk Score and Plan: Propofol infusion ? ?Airway Management Planned: Nasal Cannula and Natural Airway ? ?Additional Equipment:  ? ?Intra-op Plan:  ? ?Post-operative Plan:  ? ?Informed Consent: I have reviewed the patients History and Physical, chart, labs and discussed the procedure including the risks, benefits and alternatives for the proposed anesthesia with the patient or authorized representative who has indicated his/her understanding and acceptance.  ? ? ? ?Dental advisory given ? ?Plan Discussed with: CRNA and Surgeon ? ?Anesthesia Plan  Comments:   ? ? ? ? ? ? ?Anesthesia Quick Evaluation ? ?

## 2021-11-12 NOTE — H&P (Signed)
$'@LOGO'a$ @ ? ? ?Primary Care Physician:  Fayrene Helper, MD ?Primary Gastroenterologist:  Dr. Gala Romney ? ?Pre-Procedure History & Physical: ?HPI:  Jaime Murray is a 66 y.o. female here for high risk screening colonoscopy.  Distant history colonic polyps.  Negative colonoscopy 5 years ago.  Mother with colon cancer at age 57.  No bowel symptoms at this time. ? ?Past Medical History:  ?Diagnosis Date  ? Anxiety   ? Arthritis   ? Constipation   ? Depression   ? Fatigue   ? GERD (gastroesophageal reflux disease)   ? HTN (hypertension)   ? Hypercholesteremia   ? IDA (iron deficiency anemia)   ? ? ?Past Surgical History:  ?Procedure Laterality Date  ? COLONOSCOPY  01/01/10  ? Dr. Hansel Feinstein and sigmoid polyps, diminutive rectosigmoid polyp, tubular adenomas  ? COLONOSCOPY N/A 12/06/2015  ? normal  ? ESOPHAGOGASTRODUODENOSCOPY  12/14/09  ? Dr. Josefine Fuhr:tight schatzki's ring s/p dilation/moderate size hiatal hernia  ? ESOPHAGOGASTRODUODENOSCOPY N/A 03/20/2020  ? Procedure: ESOPHAGOGASTRODUODENOSCOPY (EGD);  Surgeon: [name], MD;  Location: [facility];  Service: General;  Laterality: N/A;  ? GANGLION CYST EXCISION  05/22/2012  ? Procedure: REMOVAL GANGLION OF WRIST;  Surgeon: Carole Civil, MD;  Location: [facility];  Service: Orthopedics;  Laterality: Left;  ? XI ROBOTIC ASSISTED PARAESOPHAGEAL HERNIA REPAIR N/A 03/20/2020  ? Procedure: ROBOTIC REPAIR OF LARGE PARAESOPHAGEAL HIATAL HERNIA WITH MESH AND GASTROPEXY AND PARTIAL FUNDOPLICATION;  Surgeon: [name], MD;  Location: [facility];  Service: General;  Laterality: N/A;  ? ? ?Prior to Admission medications   ?Medication Sig Start Date End Date Taking? Authorizing Provider  ?acetaminophen (TYLENOL) 325 MG tablet Take 650 mg by mouth every 6 (six) hours as needed for moderate pain.   Yes [provider]  ?amLODipine (NORVASC) 5 MG tablet TAKE 1 TABLET DAILY 12/25/20  Yes Fayrene Helper, MD  ?cetirizine (ZYRTEC) 10 MG tablet TAKE 1 TABLET DAILY ?Patient taking  differently: as needed. 06/15/21  Yes Fayrene Helper, MD  ?cloNIDine (CATAPRES) 0.1 MG tablet TAKE 1 TABLET AT BEDTIME 06/22/21  Yes Fayrene Helper, MD  ?fluticasone [facility]) 50 MCG/ACT nasal spray Place 2 sprays into both nostrils daily. ?Patient taking differently: Place 1 spray into both nostrils daily. 08/08/21  Yes Fayrene Helper, MD  ?folic acid (FOLVITE) 1 MG tablet Take 1 mg by mouth 2 (two) times daily.   Yes [provider]  ?hydroxypropyl methylcellulose / hypromellose (ISOPTO TEARS / GONIOVISC) 2.5 % ophthalmic solution Place 1 drop into both eyes 3 (three) times daily as needed for dry eyes.   Yes [provider]  ?lovastatin (MEVACOR) 40 MG tablet TAKE 1 TABLET AT BEDTIME 03/19/21  Yes Fayrene Helper, MD  ?Methotrexate Sodium (METHOTREXATE, PF,) 50 MG/2ML injection Inject 20 mg into the muscle every Sunday. 09/28/19  Yes [provider]  ?pantoprazole (PROTONIX) 40 MG tablet Take 1 tablet (40 mg total) by mouth 2 (two) times daily before a meal. 10/01/21  Yes Annitta Needs, NP  ?Probiotic Product (PROBIOTIC DAILY) CAPS Take 1 capsule by mouth daily.   Yes [provider]  ?sertraline (ZOLOFT) 50 MG tablet TAKE 1 TABLET DAILY 04/13/21  Yes Fayrene Helper, MD  ?VITAMIN D PO Take 1 capsule by mouth daily.   Yes [provider]  ?VITAMIN E PO Take 1 capsule by mouth daily.   Yes [provider]  ? ? ?Allergies as of 10/02/2021 - Review Complete 10/01/2021  ?Allergen Reaction Noted  ?  Gabapentin Palpitations 06/23/2017  ? ? ?Family History  ?Problem Relation Age of Onset  ? Hypertension Mother   ? Colon cancer Mother   ?     unknown age of disease, still living, cancer-free   ? Hypertension Father   ? Stroke Father   ? Heart disease Other   ? Arthritis Other   ? ? ?Social History  ? ?Socioeconomic History  ? Marital status: Married  ?  Spouse name: Not on file  ? Number of children: Not on file  ? Years of education: 42  ? Highest  education level: Not on file  ?Occupational History  ?  Employer: [employer]  ?Tobacco Use  ? Smoking status: Former  ?  Packs/day: 0.50  ?  Years: 30.00  ?  Pack years: 15.00  ?  Types: Cigarettes  ? Smokeless tobacco: Never  ?Vaping Use  ? Vaping Use: Never used  ?Substance and Sexual Activity  ? Alcohol use: Yes  ?  Comment: 3-4 beers on Friday and Saturday, no longer during week   ? Drug use: No  ? Sexual activity: Yes  ?  Birth control/protection: Post-menopausal  ?Other Topics Concern  ? Not on file  ?Social History Narrative  ? Not on file  ? ?Social Determinants of Health  ? ?Financial Resource Strain: Not on file  ?Food Insecurity: Not on file  ?Transportation Needs: Not on file  ?Physical Activity: Not on file  ?Stress: Not on file  ?Social Connections: Not on file  ?Intimate Partner Violence: Not on file  ? ? ?Review of Systems: ?See HPI, otherwise negative ROS ? ?Physical Exam: ?BP (!) 128/93   Pulse 85   Temp 98.8 ?F (37.1 ?C)   Resp 13   Ht '5\' 1"'$  (1.549 m)   Wt 63.5 kg   SpO2 97%   BMI 26.45 kg/m?  ?General:   Alert,  Well-developed, well-nourished, pleasant and cooperative in NAD ?Neck:  Supple; no masses or thyromegaly. No significant cervical adenopathy. ?Lungs:  Clear throughout to auscultation.   No wheezes, crackles, or rhonchi. No acute distress. ?Heart:  Regular rate and rhythm; no murmurs, clicks, rubs,  or gallops. ?Abdomen: Non-distended, normal bowel sounds.  Soft and nontender without appreciable mass or hepatosplenomegaly.  ?Pulses:  Normal pulses noted. ?Extremities:  Without clubbing or edema. ? ?Impression/Plan: 66 year old lady with a history of Conlon polyps and positive family history of colon cancer first-degree relative at a young age. ? ?I have offered the patient a surveillance colonoscopy per plan today. ? ?The risks, benefits, limitations, alternatives and imponderables have been reviewed with the patient. Questions have been answered. All parties are  agreeable.   ? ? ? ? ?Notice: This dictation was prepared with Dragon dictation along with smaller phrase technology. Any transcriptional errors that result from this process are unintentional and may not be corrected upon review.  ? ?

## 2021-11-13 ENCOUNTER — Encounter: Payer: Self-pay | Admitting: Internal Medicine

## 2021-11-13 LAB — SURGICAL PATHOLOGY

## 2021-11-15 ENCOUNTER — Encounter (HOSPITAL_COMMUNITY): Payer: Self-pay | Admitting: Internal Medicine

## 2021-12-27 ENCOUNTER — Telehealth: Payer: Self-pay | Admitting: Internal Medicine

## 2021-12-27 NOTE — Telephone Encounter (Signed)
There's 2 result letters from [facility]. One says next TCS in 5 yrs the other says 7 yrs. Please advise which is correct so I can correct on recall list. Thanks.

## 2021-12-27 NOTE — Telephone Encounter (Signed)
Please advise. Return date was not noted on colonoscopy.

## 2021-12-31 NOTE — Telephone Encounter (Signed)
Noted  

## 2022-01-01 ENCOUNTER — Telehealth: Admitting: Gastroenterology

## 2022-01-09 ENCOUNTER — Encounter: Payer: Self-pay | Admitting: Gastroenterology

## 2022-01-09 ENCOUNTER — Telehealth (INDEPENDENT_AMBULATORY_CARE_PROVIDER_SITE_OTHER): Admitting: Gastroenterology

## 2022-01-09 ENCOUNTER — Telehealth (INDEPENDENT_AMBULATORY_CARE_PROVIDER_SITE_OTHER): Payer: Self-pay

## 2022-01-09 DIAGNOSIS — K219 Gastro-esophageal reflux disease without esophagitis: Secondary | ICD-10-CM

## 2022-01-09 MED ORDER — PANTOPRAZOLE SODIUM 40 MG PO TBEC: 40.0000 mg | DELAYED_RELEASE_TABLET | Freq: Two times a day (BID) | ORAL | 3 refills | Status: DC

## 2022-01-09 NOTE — Patient Instructions (Signed)
Continue pantoprazole twice a day, 30 minutes before breakfast and dinner.  We will see you back in 2 years!  Annitta Needs, PhD, ANP-BC [facility]

## 2022-01-09 NOTE — Progress Notes (Signed)
Primary Care Physician:  Fayrene Helper, MD  Primary GI: Dr. Abbey Chatters  Patient Location: Home   Provider Location: [facility] office   Reason for Visit: GERD Follow-up    Persons present on the virtual encounter, with roles: Patient and NP   Total time (minutes) spent on medical discussion: 5 minutes   Due to COVID-19, visit was conducted using virtual method.  Visit was requested by patient.  Virtual Visit via Telephone Note: unable to connect to video Due to COVID-19, visit is conducted virtually and was requested by patient.   I connected with Jaime Murray on 01/09/22 at 11:30 AM EDT by telephone and verified that I am speaking with the correct person using two identifiers.   I discussed the limitations, risks, security and privacy concerns of performing an evaluation and management service by telephone and the availability of in person appointments. I also discussed with the patient that there may be a patient responsible charge related to this service. The patient expressed understanding and agreed to proceed.  Chief Complaint  Patient presents with   Gastroesophageal Reflux    Patient here today for a follow up on her Jerrye Bushy. She is taking pantoprazole 40 mg bid, and she says occasionally takes a Tums. She states her Symptoms are well controlled on current therapy.     History of Present Illness: 66 year old very pleasant 66 year old female with history of GERD, presenting for routine follow-up today. She has a personal history of adenomas and family history of colon cancer, with next surveillance due in 2028. Last colonoscopy April 2023 in interim from last visit.   Seldom takes Tums. PPI BID. No abdominal pain, N/V, changes in bowel habits, constipation, diarrhea, overt GI bleeding, GERD, dysphagia, unexplained weight loss, lack of appetite, unexplained weight gain.    Past Medical History:  Diagnosis Date   Anxiety    Arthritis    Constipation    Depression     Fatigue    GERD (gastroesophageal reflux disease)    HTN (hypertension)    Hypercholesteremia    IDA (iron deficiency anemia)      Past Surgical History:  Procedure Laterality Date   BIOPSY  11/12/2021   Procedure: BIOPSY;  Surgeon: Daneil Dolin, MD;  Location: [facility];  Service: Endoscopy;;   COLONOSCOPY  01/01/2010   Dr. Hansel Feinstein and sigmoid polyps, diminutive rectosigmoid polyp, tubular adenomas   COLONOSCOPY N/A 12/06/2015   normal   COLONOSCOPY WITH PROPOFOL N/A 11/12/2021   noncompliant and redundant colon, small sigmoid polyp s/p removal. Benign. 5 year surveillance due to family history.   ESOPHAGOGASTRODUODENOSCOPY  12/14/2009   Dr. Rourk:tight schatzki's ring s/p dilation/moderate size hiatal hernia   ESOPHAGOGASTRODUODENOSCOPY N/A 03/20/2020   Procedure: ESOPHAGOGASTRODUODENOSCOPY (EGD);  Surgeon: [name], MD;  Location: [facility];  Service: General;  Laterality: N/A;   GANGLION CYST EXCISION  05/22/2012   Procedure: REMOVAL GANGLION OF WRIST;  Surgeon: Carole Civil, MD;  Location: [facility];  Service: Orthopedics;  Laterality: Left;   POLYPECTOMY  11/12/2021   Procedure: POLYPECTOMY;  Surgeon: Daneil Dolin, MD;  Location: [facility];  Service: Endoscopy;;   XI ROBOTIC ASSISTED PARAESOPHAGEAL HERNIA REPAIR N/A 03/20/2020   Procedure: ROBOTIC REPAIR OF LARGE PARAESOPHAGEAL HIATAL HERNIA WITH MESH AND GASTROPEXY AND PARTIAL FUNDOPLICATION;  Surgeon: [name], MD;  Location: [facility];  Service: General;  Laterality: N/A;     Current Meds  Medication Sig   acetaminophen (TYLENOL) 325 MG tablet Take 106  mg by mouth every 6 (six) hours as needed for moderate pain.   amLODipine (NORVASC) 5 MG tablet TAKE 1 TABLET DAILY   cetirizine (ZYRTEC) 10 MG tablet TAKE 1 TABLET DAILY (Patient taking differently: as needed.)   cloNIDine (CATAPRES) 0.1 MG tablet TAKE 1 TABLET AT BEDTIME   fluticasone (FLONASE) 50 MCG/ACT nasal spray Place 2 sprays into both  nostrils daily. (Patient taking differently: Place 1 spray into both nostrils daily.)   folic acid (FOLVITE) 1 MG tablet Take 1 mg by mouth 2 (two) times daily.   hydroxypropyl methylcellulose / hypromellose (ISOPTO TEARS / GONIOVISC) 2.5 % ophthalmic solution Place 1 drop into both eyes 3 (three) times daily as needed for dry eyes.   lovastatin (MEVACOR) 40 MG tablet TAKE 1 TABLET AT BEDTIME   Methotrexate Sodium (METHOTREXATE, PF,) 50 MG/2ML injection Inject 20 mg into the muscle every Sunday.   Omega-3 Fatty Acids (FISH OIL) 1000 MG CPDR Take by mouth daily at 6 (six) AM.   Probiotic Product (PROBIOTIC DAILY) CAPS Take 1 capsule by mouth daily.   sertraline (ZOLOFT) 50 MG tablet TAKE 1 TABLET DAILY   VITAMIN D PO Take 1 capsule by mouth daily.   [DISCONTINUED] pantoprazole (PROTONIX) 40 MG tablet Take 1 tablet (40 mg total) by mouth 2 (two) times daily before a meal.     Family History  Problem Relation Age of Onset   Hypertension Mother    Colon cancer Mother        unknown age of disease, still living, cancer-free    Hypertension Father    Stroke Father    Heart disease Other    Arthritis Other     Social History   Socioeconomic History   Marital status: Married    Spouse name: Not on file   Number of children: Not on file   Years of education: 12   Highest education level: Not on file  Occupational History    Employer: [employer]  Tobacco Use   Smoking status: Former    Packs/day: 0.25    Years: 30.00    Total pack years: 7.50    Types: Cigarettes   Smokeless tobacco: Never  Vaping Use   Vaping Use: Never used  Substance and Sexual Activity   Alcohol use: Yes    Comment: 3-4 beers on Friday and Saturday, no longer during week    Drug use: No   Sexual activity: Yes    Birth control/protection: Post-menopausal  Other Topics Concern   Not on file  Social History Narrative   Not on file   Social Determinants of Health   Financial Resource  Strain: Not on file  Food Insecurity: Not on file  Transportation Needs: Not on file  Physical Activity: Not on file  Stress: Not on file  Social Connections: Not on file       Review of Systems: Gen: Denies fever, chills, anorexia. Denies fatigue, weakness, weight loss.  CV: Denies chest pain, palpitations, syncope, peripheral edema, and claudication. Resp: Denies dyspnea at rest, cough, wheezing, coughing up blood, and pleurisy. GI: see HPI Derm: Denies rash, itching, dry skin Psych: Denies depression, anxiety, memory loss, confusion. No homicidal or suicidal ideation.  Heme: Denies bruising, bleeding, and enlarged lymph nodes.  Observations/Objective: No distress. Unable to perform physical exam due to telephone encounter. Weight 142-145.   Assessment and Plan: 66 year old female doing well on PPI BID without alarm signs/symptoms. Colonoscopy is up-to-date, with next surveillance due in 2028 due to  personal history of adenomas and family history of colon cancer.   We will see her back in 2 years or sooner if needed.   Follow Up Instructions:    I discussed the assessment and treatment plan with the patient. The patient was provided an opportunity to ask questions and all were answered. The patient agreed with the plan and demonstrated an understanding of the instructions.   The patient was advised to call back or seek an in-person evaluation if the symptoms worsen or if the condition fails to improve as anticipated.  I provided 5 minutes of non face-to-face time during this telephone encounter.  Annitta Needs, PhD, ANP-BC [facility]

## 2022-01-17 ENCOUNTER — Other Ambulatory Visit: Payer: Self-pay | Admitting: Family Medicine

## 2022-02-12 ENCOUNTER — Other Ambulatory Visit: Payer: Self-pay | Admitting: Family Medicine

## 2022-03-12 ENCOUNTER — Other Ambulatory Visit: Payer: Self-pay | Admitting: Family Medicine

## 2022-04-08 ENCOUNTER — Other Ambulatory Visit: Payer: Self-pay | Admitting: Family Medicine

## 2022-04-09 ENCOUNTER — Ambulatory Visit (INDEPENDENT_AMBULATORY_CARE_PROVIDER_SITE_OTHER): Admitting: Family Medicine

## 2022-04-09 ENCOUNTER — Other Ambulatory Visit (HOSPITAL_COMMUNITY)
Admission: RE | Admit: 2022-04-09 | Discharge: 2022-04-09 | Disposition: A | Discharge status: Home / Self Care | Source: Ambulatory Visit | Attending: Family Medicine | Admitting: Family Medicine

## 2022-04-09 ENCOUNTER — Encounter: Payer: Self-pay | Admitting: Family Medicine

## 2022-04-09 VITALS — BP 119/76 | HR 88 | Ht 61.0 in | Wt 144.1 lb

## 2022-04-09 DIAGNOSIS — F324 Major depressive disorder, single episode, in partial remission: Secondary | ICD-10-CM

## 2022-04-09 DIAGNOSIS — E785 Hyperlipidemia, unspecified: Secondary | ICD-10-CM

## 2022-04-09 DIAGNOSIS — Z124 Encounter for screening for malignant neoplasm of cervix: Secondary | ICD-10-CM

## 2022-04-09 DIAGNOSIS — Z01419 Encounter for gynecological examination (general) (routine) without abnormal findings: Secondary | ICD-10-CM | POA: Insufficient documentation

## 2022-04-09 DIAGNOSIS — Z23 Encounter for immunization: Secondary | ICD-10-CM

## 2022-04-09 DIAGNOSIS — I10 Essential (primary) hypertension: Secondary | ICD-10-CM | POA: Diagnosis not present

## 2022-04-09 DIAGNOSIS — K219 Gastro-esophageal reflux disease without esophagitis: Secondary | ICD-10-CM

## 2022-04-09 DIAGNOSIS — R7303 Prediabetes: Secondary | ICD-10-CM

## 2022-04-09 DIAGNOSIS — Z87891 Personal history of nicotine dependence: Secondary | ICD-10-CM

## 2022-04-09 NOTE — Assessment & Plan Note (Signed)
Needs chest scan , refuses same , but will think about it

## 2022-04-09 NOTE — Patient Instructions (Addendum)
Annual exam in 6 months, call if you need me sooner  Please schedule mammogram at checkout  Please call  if  you decide on chest scan for lung cancer screening which is recommended  Flu vaccine today and pap sent HBA1C, lipid, cmp and eGFr today  It is important that you exercise regularly at least 30 minutes 5 times a week. If you develop chest pain, have severe difficulty breathing, or feel very tired, stop exercising immediately and seek medical attention   Think about what you will eat, plan ahead. Choose " clean, green, fresh or frozen" over canned, processed or packaged foods which are more sugary, salty and fatty. 70 to 75% of food eaten should be vegetables and fruit. Three meals at set times with snacks allowed between meals, but they must be fruit or vegetables. Aim to eat over a 12 hour period , example 7 am to 7 pm, and STOP after  your last meal of the day. Drink water,generally about 64 ounces per day, no other drink is as healthy. Fruit juice is best enjoyed in a healthy way, by EATING the fruit. Thanks for choosing [facility], we consider it a privelige to serve you.

## 2022-04-09 NOTE — Assessment & Plan Note (Signed)
Controlled, no change in medication  

## 2022-04-09 NOTE — Assessment & Plan Note (Signed)
Exam as documented Pap sent

## 2022-04-09 NOTE — Assessment & Plan Note (Signed)
Hyperlipidemia:Low fat diet discussed and encouraged.   Lipid Panel  Lab Results  Component Value Date   CHOL 175 08/08/2021   HDL 88 08/08/2021   LDLCALC 75 08/08/2021   TRIG 61 08/08/2021   CHOLHDL 2.0 08/08/2021     Updated lab needed at/ before next visit. Controlled, no change in medication

## 2022-04-09 NOTE — Assessment & Plan Note (Signed)
Controlled, no change in medication DASH diet and commitment to daily physical activity for a minimum of 30 minutes discussed and encouraged, as a part of hypertension management. The importance of attaining a healthy weight is also discussed.     04/09/2022    8:08 AM 01/09/2022   10:06 AM 11/12/2021    9:13 AM 11/12/2021    7:19 AM 11/12/2021    7:17 AM 10/01/2021    2:01 PM 08/08/2021    8:14 AM  BP/Weight  Systolic BP 983  382 505   397  Diastolic BP 76  60 93   75  Wt. (Lbs) 144.08 142   140 146 139  BMI 27.22 kg/m2 26.83 kg/m2   26.45 kg/m2 29.49 kg/m2 26.26 kg/m2

## 2022-04-09 NOTE — Progress Notes (Signed)
Jaime Murray     MRN: [id number]      DOB: 23-May-1956   HPI Jaime Murray is here for follow up and re-evaluation of chronic medical conditions, medication management and review of any available recent lab and radiology data.  Preventive health is updated, specifically  Cancer screening and Immunization.   Questions or concerns regarding consultations or procedures which the PT has had in the interim are  addressed. The PT denies any adverse reactions to current medications since the last visit.  There are no new concerns.  There are no specific complaints   ROS Denies recent fever or chills. Denies sinus pressure, nasal congestion, ear pain or sore throat. Denies chest congestion, productive cough or wheezing. Denies chest pains, palpitations and leg swelling Denies abdominal pain, nausea, vomiting,diarrhea or constipation.   Denies dysuria, frequency, hesitancy or incontinence. Denies joint pain, swelling and limitation in mobility. Denies headaches, seizures, numbness, or tingling. Denies depression, anxiety or insomnia. Denies skin break down or rash.   PE  BP 119/76 (BP Location: Right Arm, Patient Position: Sitting, Cuff Size: Normal)   Pulse 88   Ht '5\' 1"'$  (1.549 m)   Wt 144 lb 1.3 oz (65.4 kg)   SpO2 92%   BMI 27.22 kg/m   Patient alert and oriented and in no cardiopulmonary distress.  HEENT: No facial asymmetry, EOMI,     Neck supple .  Chest: Clear to auscultation bilaterally.  CVS: S1, S2 no murmurs, no S3.Regular rate.  ABD: Soft non tender.   Pelvic: external genitalia; normal female distribution of hair, no a inguinal lymph adenopathy Internal; NO ulcers, physiologic d/c , vaginal walls pink and moist. Uterus normal size, no adnexal massesa. Cervix appears healtyth. No cervical motion or adnexal tenderness  Ext: No edema  MS: Adequate ROM spine, shoulders, hips and knees.  Skin: Intact, no ulcerations or rash noted.  Psych: Good eye contact,  normal affect. Memory intact not anxious or depressed appearing.  CNS: CN 2-12 intact, power,  normal throughout.no focal deficits noted.   Assessment & Plan  Hypertension goal BP (blood pressure) < 130/80 Controlled, no change in medication DASH diet and commitment to daily physical activity for a minimum of 30 minutes discussed and encouraged, as a part of hypertension management. The importance of attaining a healthy weight is also discussed.     04/09/2022    8:08 AM 01/09/2022   10:06 AM 11/12/2021    9:13 AM 11/12/2021    7:19 AM 11/12/2021    7:17 AM 10/01/2021    2:01 PM 08/08/2021    8:14 AM  BP/Weight  Systolic BP 408  144 818   563  Diastolic BP 76  60 93   75  Wt. (Lbs) 144.08 142   140 146 139  BMI 27.22 kg/m2 26.83 kg/m2   26.45 kg/m2 29.49 kg/m2 26.26 kg/m2       Periodic health assessment, Pap and pelvic Exam as documented Pap sent  H/O nicotine dependence Needs chest scan , refuses same , but will think about it  Depression, major, single episode, in partial remission (HCC) Controlled, no change in medication   Hyperlipidemia LDL goal <100 Hyperlipidemia:Low fat diet discussed and encouraged.   Lipid Panel  Lab Results  Component Value Date   CHOL 175 08/08/2021   HDL 88 08/08/2021   LDLCALC 75 08/08/2021   TRIG 61 08/08/2021   CHOLHDL 2.0 08/08/2021     Updated lab needed at/ before next visit. Controlled,  no change in medication   GERD Controlled, no change in medication

## 2022-04-10 LAB — CMP14+EGFR
ALT: 15 IU/L (ref 0–32)
AST: 19 IU/L (ref 0–40)
Albumin/Globulin Ratio: 1.6 (ref 1.2–2.2)
Albumin: 4.1 g/dL (ref 3.9–4.9)
Alkaline Phosphatase: 87 IU/L (ref 44–121)
BUN/Creatinine Ratio: 11 — ABNORMAL LOW (ref 12–28)
BUN: 7 mg/dL — ABNORMAL LOW (ref 8–27)
Bilirubin Total: 0.3 mg/dL (ref 0.0–1.2)
CO2: 23 mmol/L (ref 20–29)
Calcium: 9.2 mg/dL (ref 8.7–10.3)
Chloride: 104 mmol/L (ref 96–106)
Creatinine, Ser: 0.61 mg/dL (ref 0.57–1.00)
Globulin, Total: 2.5 g/dL (ref 1.5–4.5)
Glucose: 123 mg/dL — ABNORMAL HIGH (ref 70–99)
Potassium: 3.3 mmol/L — ABNORMAL LOW (ref 3.5–5.2)
Sodium: 142 mmol/L (ref 134–144)
Total Protein: 6.6 g/dL (ref 6.0–8.5)
eGFR: 100 mL/min/{1.73_m2} (ref >59)

## 2022-04-10 LAB — LIPID PANEL
Chol/HDL Ratio: 2.4 ratio (ref 0.0–4.4)
Cholesterol, Total: 170 mg/dL (ref 100–199)
HDL: 70 mg/dL (ref >39)
LDL Chol Calc (NIH): 83 mg/dL (ref 0–99)
Triglycerides: 91 mg/dL (ref 0–149)
VLDL Cholesterol Cal: 17 mg/dL (ref 5–40)

## 2022-04-10 LAB — HEMOGLOBIN A1C
Est. average glucose Bld gHb Est-mCnc: 137 mg/dL
Hgb A1c MFr Bld: 6.4 % — ABNORMAL HIGH (ref 4.8–5.6)

## 2022-04-11 LAB — CYTOLOGY - PAP
Comment: NEGATIVE
Diagnosis: NEGATIVE
Diagnosis: REACTIVE
High risk HPV: NEGATIVE

## 2022-04-13 ENCOUNTER — Other Ambulatory Visit: Payer: Self-pay | Admitting: Family Medicine

## 2022-04-13 MED ORDER — POTASSIUM CHLORIDE CRYS ER 10 MEQ PO TBCR: 10.0000 meq | EXTENDED_RELEASE_TABLET | Freq: Every day | ORAL | 2 refills | Status: DC

## 2022-04-24 ENCOUNTER — Ambulatory Visit (HOSPITAL_COMMUNITY)
Admission: RE | Admit: 2022-04-24 | Discharge: 2022-04-24 | Disposition: A | Discharge status: Home / Self Care | Source: Ambulatory Visit | Attending: Family Medicine | Admitting: Family Medicine

## 2022-04-24 DIAGNOSIS — Z1231 Encounter for screening mammogram for malignant neoplasm of breast: Secondary | ICD-10-CM | POA: Insufficient documentation

## 2022-06-22 IMAGING — CT CT CHEST W/ CM
2 of 3 series · 15 of 36 positions shown, 18 images · IV contrast (Omnipaque or Isovue)
Comparison: 11/25/2013

CLINICAL DATA: Pulmonary nodules

EXAM:
CT CHEST WITH CONTRAST
TECHNIQUE: Multidetector CT imaging of the chest was performed during
intravenous contrast administration.
CONTRAST:  75mL OMNIPAQUE IOHEXOL 300 MG/ML  SOLN

[Series 2: routine chest with · axial · 0.60mm/px · z∈[+1298,+1534]mm · 12 of 140 slices shown, 15 images]
[im 11/140  mediastinal]
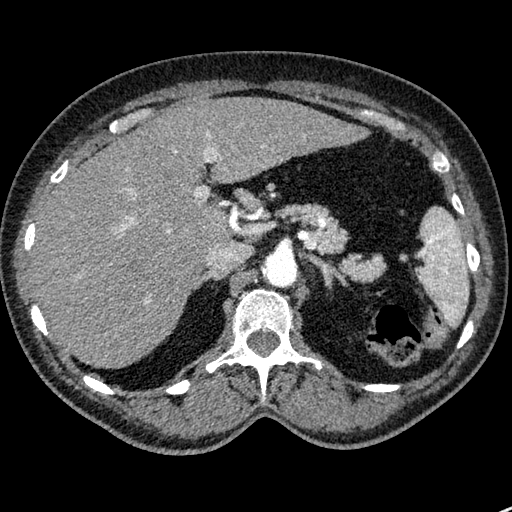
[im 11/140  lung]
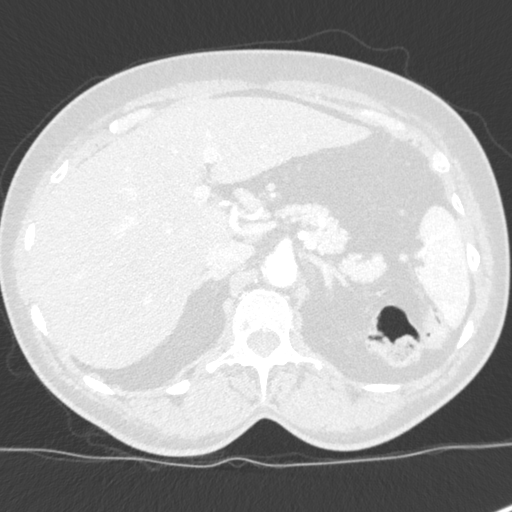
[im 21/140  lung]
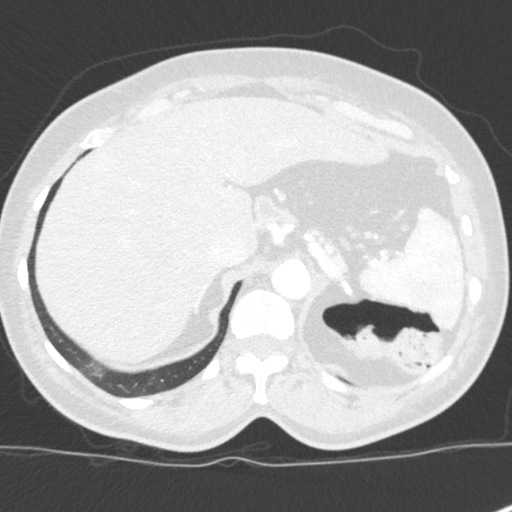
[im 31/140  lung]
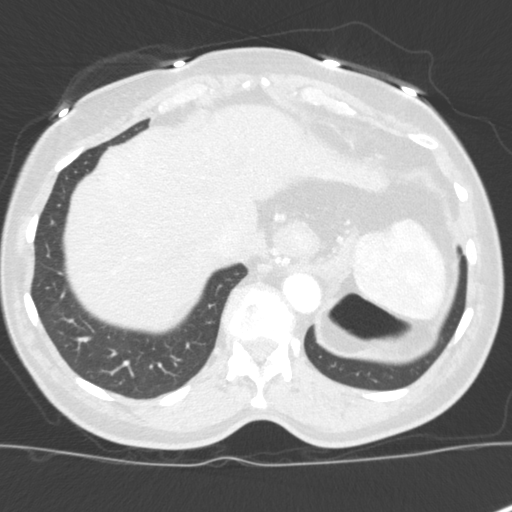
[im 42/140  lung]
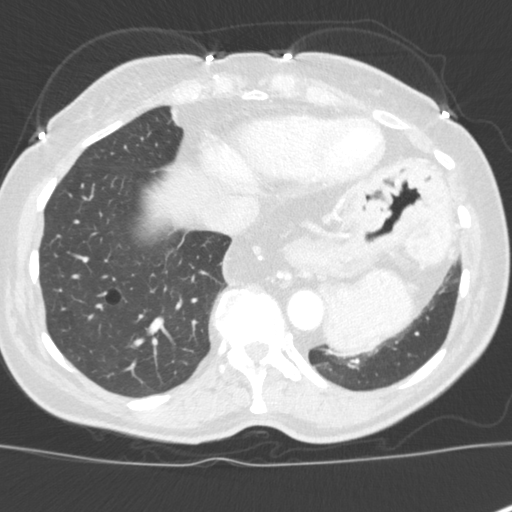
[im 52/140  mediastinal]
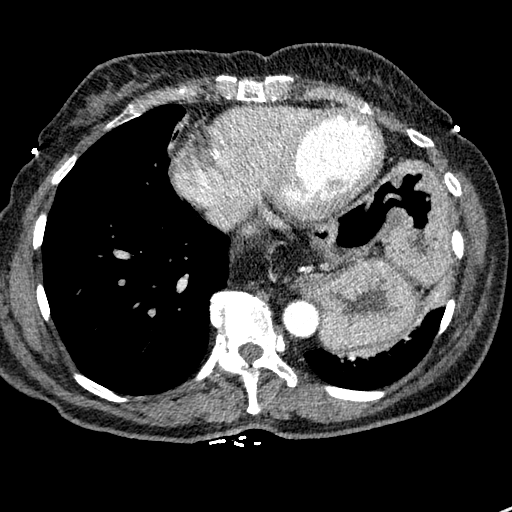
[im 52/140  lung]
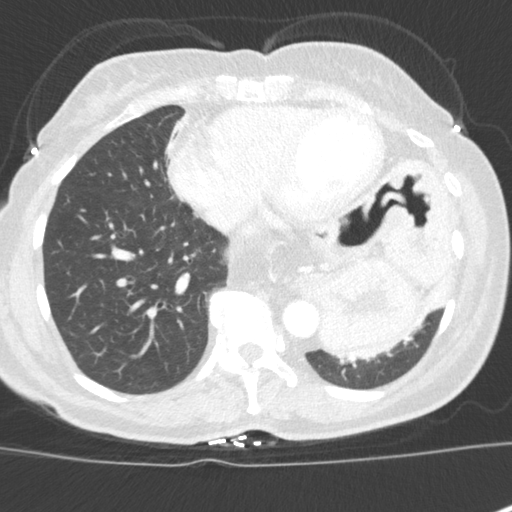
[im 62/140  lung]
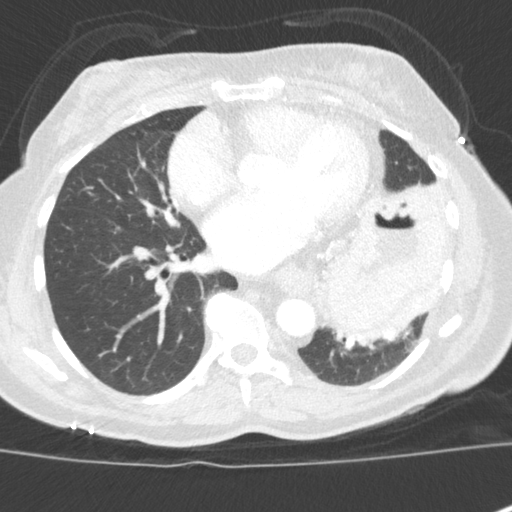
[im 78/140  lung]
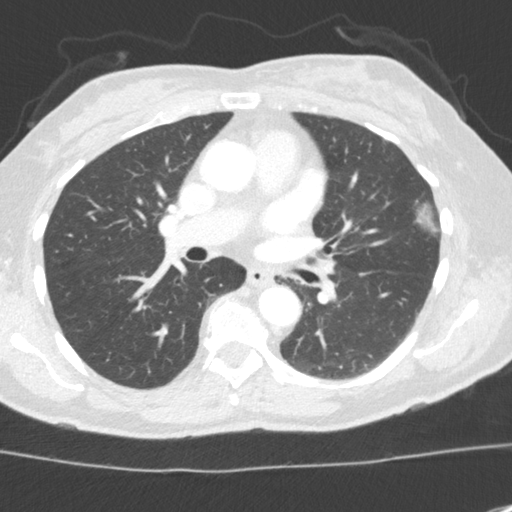
[im 88/140  lung]
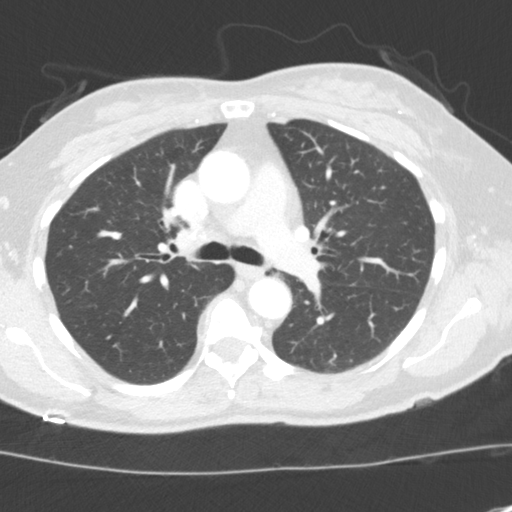
[im 98/140  mediastinal]
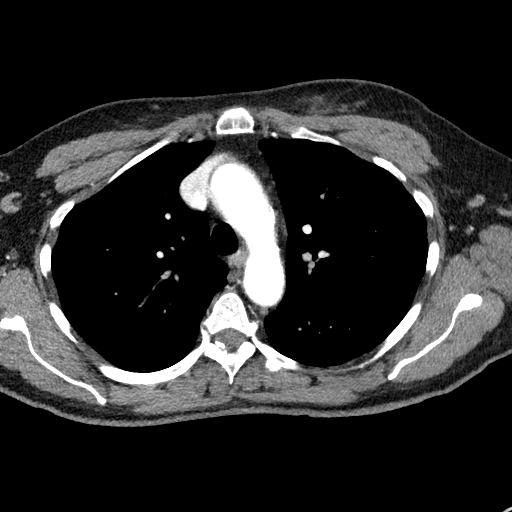
[im 98/140  lung]
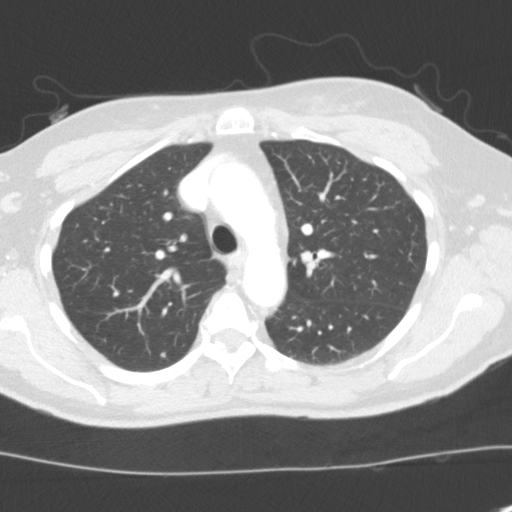
[im 109/140  lung]
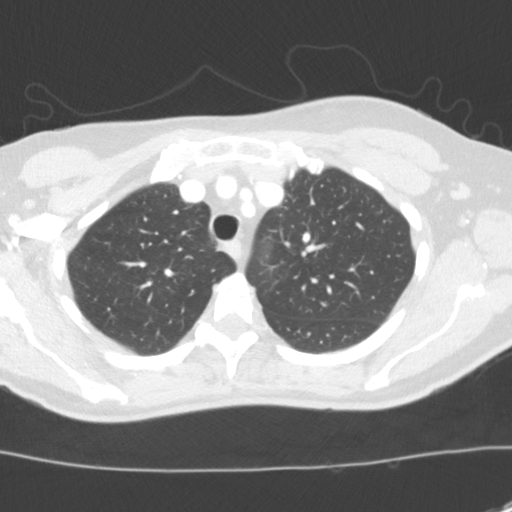
[im 119/140  lung]
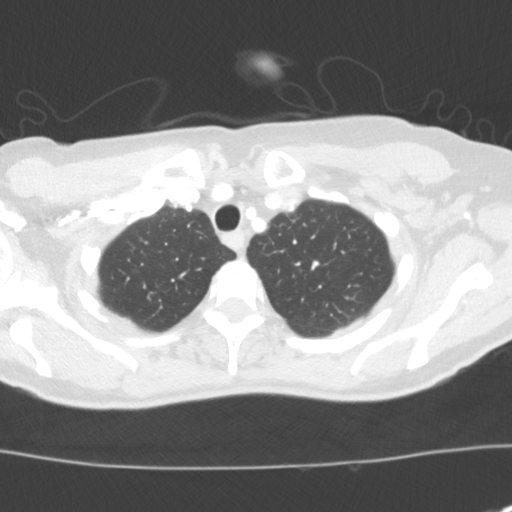
[im 129/140  lung]
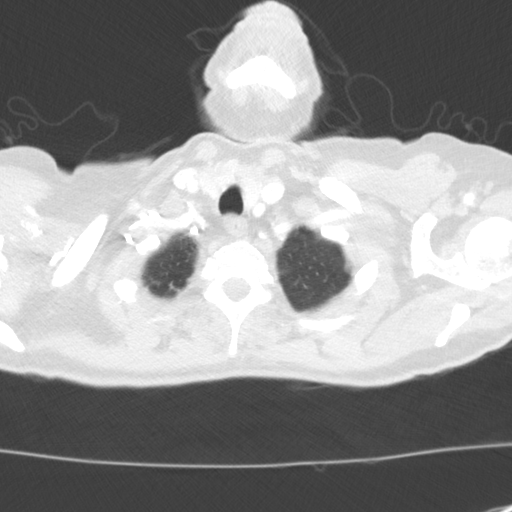

[Series 5: coronal · coronal · 0.56mm/px · 3 of 134 slices shown]
[im 27/134  lung]
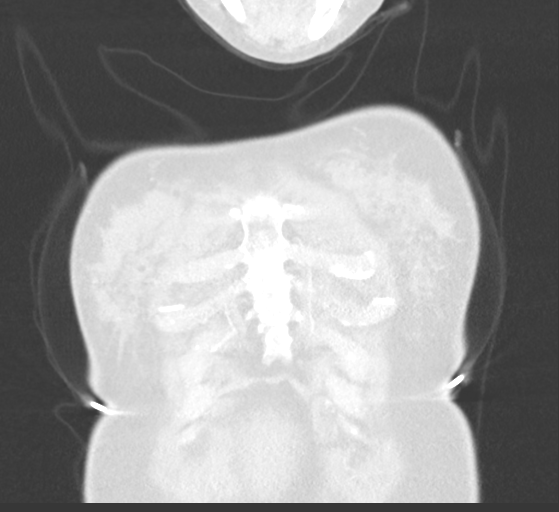
[im 54/134  lung]
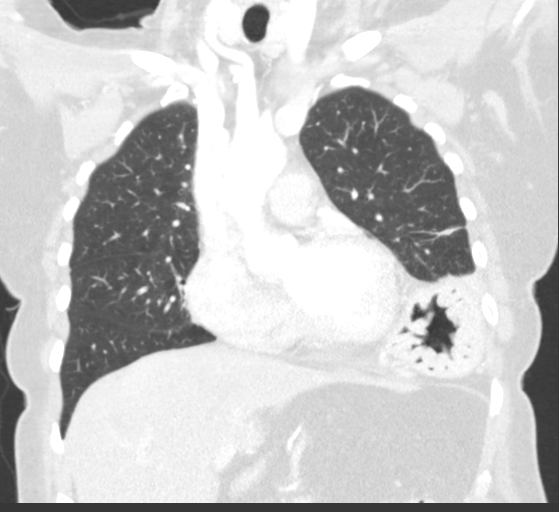
[im 80/134  lung]
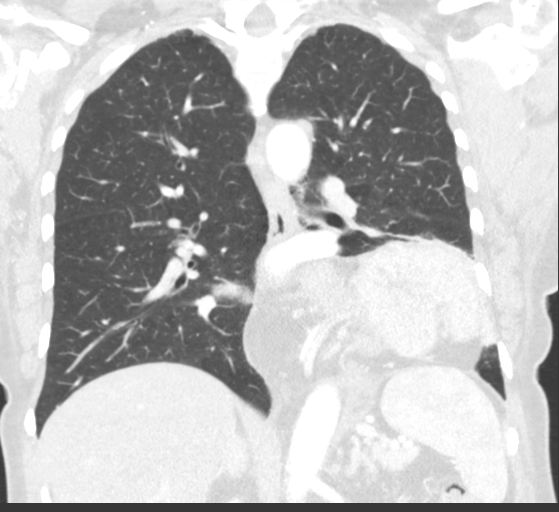

[15 of 36 positions shown; findings below may reference images not displayed]

FINDINGS: Cardiovascular: The heart and great vessels are unremarkable without
pericardial effusion. Mild atherosclerosis within the aortic arch.
No evidence of thoracic aortic aneurysm or dissection.

Mediastinum/Nodes: Large hiatal hernia is unchanged. Otherwise the
thyroid, esophagus, and trachea are unremarkable. No pathologic
adenopathy.

Lungs/Pleura: There are areas of compressive atelectasis within the
lingula and left lower lobe, related to the large hiatal hernia
described above. Otherwise no acute airspace disease, effusion, or
pneumothorax. Mild background emphysema.

There is a stable 5 mm subpleural left upper lobe pulmonary nodule
reference image [DATE]. There is a stable 3 mm right upper lobe
pulmonary nodule reference image 43/4. There is a stable 4 mm left
lower lobe pulmonary nodule image 61/4. Given the appearance and
long-term stability these are benign and do not require imaging
follow-up. There are no new pulmonary nodules or masses. Central
airways are patent.

Upper Abdomen: No acute abnormality.

Musculoskeletal: No acute or destructive bony lesions. Reconstructed
images demonstrate no additional findings.
IMPRESSION: 1. Multiple stable pulmonary nodules, all less than 5 mm in size.
Given stability and appearance, no further imaging workup is
required. This recommendation follows the consensus statement:
Guidelines for Management of Incidental Pulmonary Nodules Detected
[DATE].
2. Large hiatal hernia, with minimal compressive atelectasis at the
left lung base.
3. Aortic Atherosclerosis (U9XBF-VB4.4) and Emphysema (U9XBF-N52.R).

## 2022-07-10 ENCOUNTER — Other Ambulatory Visit: Payer: Self-pay | Admitting: Family Medicine

## 2022-08-22 ENCOUNTER — Other Ambulatory Visit: Payer: Self-pay | Admitting: Family Medicine

## 2022-10-02 ENCOUNTER — Telehealth: Payer: Self-pay | Admitting: Family Medicine

## 2022-10-02 NOTE — Telephone Encounter (Signed)
[facility] called in on patient behalf.   Do not have sertraline (ZOLOFT) 50 MG tablet   In stock   Does have '100mg'$  in stock.

## 2022-10-03 ENCOUNTER — Other Ambulatory Visit: Payer: Self-pay

## 2022-10-03 MED ORDER — SERTRALINE HCL 100 MG PO TABS
50.0000 mg | ORAL_TABLET | Freq: Every day | ORAL | 0 refills | Status: DC
Start: 2022-10-03 — End: 2023-01-13

## 2022-10-03 NOTE — Telephone Encounter (Signed)
[id number] 

## 2022-10-03 NOTE — Telephone Encounter (Signed)
Rx sent for 100 mg 1/2 tablet daily

## 2022-10-07 ENCOUNTER — Telehealth: Payer: Self-pay | Admitting: Family Medicine

## 2022-10-07 NOTE — Telephone Encounter (Signed)
Pt returning call

## 2022-10-08 NOTE — Telephone Encounter (Signed)
Patient aware see other tele msg  

## 2022-10-08 NOTE — Telephone Encounter (Signed)
Patient aware.

## 2022-10-09 ENCOUNTER — Encounter: Payer: Self-pay | Admitting: Family Medicine

## 2022-10-09 ENCOUNTER — Ambulatory Visit: Admitting: Family Medicine

## 2022-11-14 ENCOUNTER — Other Ambulatory Visit: Payer: Self-pay | Admitting: Family Medicine

## 2022-11-14 DIAGNOSIS — Z1231 Encounter for screening mammogram for malignant neoplasm of breast: Secondary | ICD-10-CM

## 2022-11-19 ENCOUNTER — Ambulatory Visit: Admitting: Family Medicine

## 2022-11-19 ENCOUNTER — Encounter: Payer: Self-pay | Admitting: Family Medicine

## 2022-11-19 VITALS — BP 152/88 | HR 74 | Resp 16 | Ht 61.0 in | Wt 144.0 lb

## 2022-11-19 DIAGNOSIS — E785 Hyperlipidemia, unspecified: Secondary | ICD-10-CM | POA: Diagnosis not present

## 2022-11-19 DIAGNOSIS — R7303 Prediabetes: Secondary | ICD-10-CM

## 2022-11-19 DIAGNOSIS — I10 Essential (primary) hypertension: Secondary | ICD-10-CM | POA: Diagnosis not present

## 2022-11-19 DIAGNOSIS — E559 Vitamin D deficiency, unspecified: Secondary | ICD-10-CM

## 2022-11-19 DIAGNOSIS — Z1231 Encounter for screening mammogram for malignant neoplasm of breast: Secondary | ICD-10-CM

## 2022-11-19 DIAGNOSIS — F17211 Nicotine dependence, cigarettes, in remission: Secondary | ICD-10-CM

## 2022-11-19 MED ORDER — AMLODIPINE BESYLATE 5 MG PO TABS: 5.0000 mg | ORAL_TABLET | Freq: Every day | ORAL | 3 refills | Status: DC

## 2022-11-19 NOTE — Patient Instructions (Addendum)
F/U in 2 months , re evaluate blood pressure   Fasting lipid, cmp and EGFr, HBA1C, CBC, TSH and vit D 3 to 5 days before next visit  Please Commit to Vit D3, OTC , 5000 IU every day  Pls bring tablets to next visit so we can REALLY know the deal!  Pls DO get lung cancer screening test   Pls schedule October mammogram at checkout  Thanks for choosing [facility], we consider it a privelige to serve you.

## 2022-11-20 DIAGNOSIS — E559 Vitamin D deficiency, unspecified: Secondary | ICD-10-CM | POA: Insufficient documentation

## 2022-11-20 NOTE — Assessment & Plan Note (Signed)
Uncontrolled, resume amlodipine reevaluate in the next several weeks. DASH diet and commitment to daily physical activity for a minimum of 30 minutes discussed and encouraged, as a part of hypertension management. The importance of attaining a healthy weight is also discussed.     11/19/2022    3:25 PM 11/19/2022    3:24 PM 04/09/2022    8:08 AM 01/09/2022   10:06 AM 11/12/2021    9:13 AM 11/12/2021    7:19 AM 11/12/2021    7:17 AM  BP/Weight  Systolic BP 152 147 119  100 128   Diastolic BP 88 81 76  60 93   Wt. (Lbs)  144 144.08 142   140  BMI  27.21 kg/m2 27.22 kg/m2 26.83 kg/m2   26.45 kg/m2

## 2022-11-20 NOTE — Assessment & Plan Note (Signed)
Patient educated about the importance of limiting  Carbohydrate intake , the need to commit to daily physical activity for a minimum of 30 minutes , and to commit weight loss. The fact that changes in all these areas will reduce or eliminate all together the development of diabetes is stressed.      Latest Ref Rng & Units 04/09/2022    9:06 AM 08/08/2021    9:19 AM 08/08/2021    9:14 AM 11/27/2020    8:36 AM 07/04/2020    8:21 AM  Diabetic Labs  HbA1c 4.8 - 5.6 % 6.4   5.9  6.2  6.0   Chol 100 - 199 mg/dL 562  130   865  784   HDL >39 mg/dL 70  88   66  68   Calc LDL 0 - 99 mg/dL 83  75   696  295   Triglycerides 0 - 149 mg/dL 91  61   284  132   Creatinine 0.57 - 1.00 mg/dL 4.40  1.02   7.25  3.66       11/19/2022    3:25 PM 11/19/2022    3:24 PM 04/09/2022    8:08 AM 01/09/2022   10:06 AM 11/12/2021    9:13 AM 11/12/2021    7:19 AM 11/12/2021    7:17 AM  BP/Weight  Systolic BP 152 147 119  100 128   Diastolic BP 88 81 76  60 93   Wt. (Lbs)  144 144.08 142   140  BMI  27.21 kg/m2 27.22 kg/m2 26.83 kg/m2   26.45 kg/m2      Latest Ref Rng & Units 04/08/2019    2:40 PM 09/01/2018   12:00 AM  Foot/eye exam completion dates  Eye Exam No Retinopathy  No Retinopathy      Foot Form Completion  Done      This result is from an external source.    Updated lab needed at/ before next visit.

## 2022-11-20 NOTE — Progress Notes (Signed)
Jaime Murray     MRN: [id number]      DOB: [birth year]  Chief Complaint  Patient presents with   Hypertension    Follow up visit - has not been taking amlodipine, doesn't have on her dispense history     HPI Jaime Murray is here for follow up and re-evaluation of chronic medical conditions, medication management and review of any available recent lab and radiology data.  Preventive health is updated, specifically  Cancer screening and Immunization.   Questions or concerns regarding consultations or procedures which the PT has had in the interim are  addressed. The PT denies any adverse reactions to current medications since the last visit.  There are no new concerns.  There are no specific complaints   ROS Denies recent fever or chills. Denies sinus pressure, nasal congestion, ear pain or sore throat. Denies chest congestion, productive cough or wheezing. Denies chest pains, palpitations and leg swelling Denies abdominal pain, nausea, vomiting,diarrhea or constipation.   Denies dysuria, frequency, hesitancy or incontinence. Denies joint pain, swelling and limitation in mobility. Denies headaches, seizures, numbness, or tingling. Denies depression, anxiety or insomnia. Denies skin break down or rash.   PE  BP (!) 152/88   Pulse 74   Resp 16   Ht 5\' 1"  (1.549 m)   Wt 144 lb (65.3 kg)   SpO2 95%   BMI 27.21 kg/m   Patient alert and oriented and in no cardiopulmonary distress.  HEENT: No facial asymmetry, EOMI,     Neck supple .  Chest: Clear to auscultation bilaterally.  CVS: S1, S2 no murmurs, no S3.Regular rate.  ABD: Soft non tender.   Ext: No edema  MS: Adequate ROM spine, shoulders, hips and knees.  Skin: Intact, no ulcerations or rash noted.  Psych: Good eye contact, normal affect. Memory intact not anxious or depressed appearing.  CNS: CN 2-12 intact, power,  normal throughout.no focal deficits noted.   Assessment & Plan  Hypertension goal BP  (blood pressure) < 130/80 Uncontrolled, resume amlodipine reevaluate in the next several weeks. DASH diet and commitment to daily physical activity for a minimum of 30 minutes discussed and encouraged, as a part of hypertension management. The importance of attaining a healthy weight is also discussed.     11/19/2022    3:25 PM 11/19/2022    3:24 PM 04/09/2022    8:08 AM 01/09/2022   10:06 AM 11/12/2021    9:13 AM 11/12/2021    7:19 AM 11/12/2021    7:17 AM  BP/Weight  Systolic BP 152 147 119  100 128   Diastolic BP 88 81 76  60 93   Wt. (Lbs)  144 144.08 142   140  BMI  27.21 kg/m2 27.22 kg/m2 26.83 kg/m2   26.45 kg/m2       Prediabetes Patient educated about the importance of limiting  Carbohydrate intake , the need to commit to daily physical activity for a minimum of 30 minutes , and to commit weight loss. The fact that changes in all these areas will reduce or eliminate all together the development of diabetes is stressed.      Latest Ref Rng & Units 04/09/2022    9:06 AM 08/08/2021    9:19 AM 08/08/2021    9:14 AM 11/27/2020    8:36 AM 07/04/2020    8:21 AM  Diabetic Labs  HbA1c 4.8 - 5.6 % 6.4   5.9  6.2  6.0   Chol 100 - 199  mg/dL 604  540   981  191   HDL >39 mg/dL 70  88   66  68   Calc LDL 0 - 99 mg/dL 83  75   478  295   Triglycerides 0 - 149 mg/dL 91  61   621  308   Creatinine 0.57 - 1.00 mg/dL 6.57  8.46   9.62  9.52       11/19/2022    3:25 PM 11/19/2022    3:24 PM 04/09/2022    8:08 AM 01/09/2022   10:06 AM 11/12/2021    9:13 AM 11/12/2021    7:19 AM 11/12/2021    7:17 AM  BP/Weight  Systolic BP 152 147 119  100 128   Diastolic BP 88 81 76  60 93   Wt. (Lbs)  144 144.08 142   140  BMI  27.21 kg/m2 27.22 kg/m2 26.83 kg/m2   26.45 kg/m2      Latest Ref Rng & Units 04/08/2019    2:40 PM 09/01/2018   12:00 AM  Foot/eye exam completion dates  Eye Exam No Retinopathy  No Retinopathy      Foot Form Completion  Done      This result is from an external source.     Updated lab needed at/ before next visit.   Hyperlipidemia LDL goal <100 Hyperlipidemia:Low fat diet discussed and encouraged.   Lipid Panel  Lab Results  Component Value Date   CHOL 170 04/09/2022   HDL 70 04/09/2022   LDLCALC 83 04/09/2022   TRIG 91 04/09/2022   CHOLHDL 2.4 04/09/2022     Updated lab needed at/ before next visit.   Vitamin D deficiency Updated lab needed at/ before next visit.

## 2022-11-20 NOTE — Assessment & Plan Note (Signed)
Updated lab needed at/ before next visit.   

## 2022-11-20 NOTE — Assessment & Plan Note (Signed)
Hyperlipidemia:Low fat diet discussed and encouraged.   Lipid Panel  Lab Results  Component Value Date   CHOL 170 04/09/2022   HDL 70 04/09/2022   LDLCALC 83 04/09/2022   TRIG 91 04/09/2022   CHOLHDL 2.4 04/09/2022     Updated lab needed at/ before next visit.

## 2022-11-25 ENCOUNTER — Encounter

## 2023-01-02 ENCOUNTER — Telehealth: Payer: Self-pay | Admitting: Family Medicine

## 2023-01-02 ENCOUNTER — Other Ambulatory Visit: Payer: Self-pay

## 2023-01-02 DIAGNOSIS — Z87891 Personal history of nicotine dependence: Secondary | ICD-10-CM

## 2023-01-02 NOTE — Telephone Encounter (Signed)
Patient called asked when is Dr Lodema Hong going to order the xray of lungs as discuss in last visit, please give patient a call once scheduled.

## 2023-01-02 NOTE — Telephone Encounter (Signed)
Order placed patient aware.

## 2023-01-13 ENCOUNTER — Other Ambulatory Visit: Payer: Self-pay | Admitting: Family Medicine

## 2023-01-13 ENCOUNTER — Other Ambulatory Visit: Payer: Self-pay | Admitting: Gastroenterology

## 2023-01-13 DIAGNOSIS — K219 Gastro-esophageal reflux disease without esophagitis: Secondary | ICD-10-CM

## 2023-01-22 LAB — LIPID PANEL
Chol/HDL Ratio: 2.3 ratio (ref 0.0–4.4)
Cholesterol, Total: 192 mg/dL (ref 100–199)
HDL: 85 mg/dL (ref >39)
LDL Chol Calc (NIH): 93 mg/dL (ref 0–99)
Triglycerides: 76 mg/dL (ref 0–149)
VLDL Cholesterol Cal: 14 mg/dL (ref 5–40)

## 2023-01-22 LAB — CMP14+EGFR
ALT: 22 IU/L (ref 0–32)
AST: 21 IU/L (ref 0–40)
Albumin: 4.2 g/dL (ref 3.9–4.9)
Alkaline Phosphatase: 76 IU/L (ref 44–121)
BUN/Creatinine Ratio: 12 (ref 12–28)
BUN: 7 mg/dL — ABNORMAL LOW (ref 8–27)
Bilirubin Total: 0.3 mg/dL (ref 0.0–1.2)
CO2: 26 mmol/L (ref 20–29)
Calcium: 8.8 mg/dL (ref 8.7–10.3)
Chloride: 105 mmol/L (ref 96–106)
Creatinine, Ser: 0.58 mg/dL (ref 0.57–1.00)
Globulin, Total: 2.2 g/dL (ref 1.5–4.5)
Glucose: 107 mg/dL — ABNORMAL HIGH (ref 70–99)
Potassium: 3.1 mmol/L — ABNORMAL LOW (ref 3.5–5.2)
Sodium: 145 mmol/L — ABNORMAL HIGH (ref 134–144)
Total Protein: 6.4 g/dL (ref 6.0–8.5)
eGFR: 101 mL/min/{1.73_m2} (ref >59)

## 2023-01-22 LAB — CBC WITH DIFFERENTIAL/PLATELET
Basophils Absolute: 0 10*3/uL (ref 0.0–0.2)
Basos: 1 %
EOS (ABSOLUTE): 0.2 10*3/uL (ref 0.0–0.4)
Eos: 3 %
Hematocrit: 35.9 % (ref 34.0–46.6)
Hemoglobin: 12.3 g/dL (ref 11.1–15.9)
Immature Grans (Abs): 0 10*3/uL (ref 0.0–0.1)
Immature Granulocytes: 0 %
Lymphocytes Absolute: 1.9 10*3/uL (ref 0.7–3.1)
Lymphs: 31 %
MCH: 28.9 pg (ref 26.6–33.0)
MCHC: 34.3 g/dL (ref 31.5–35.7)
MCV: 84 fL (ref 79–97)
Monocytes Absolute: 0.4 10*3/uL (ref 0.1–0.9)
Monocytes: 7 %
Neutrophils Absolute: 3.5 10*3/uL (ref 1.4–7.0)
Neutrophils: 58 %
Platelets: 204 10*3/uL (ref 150–450)
RBC: 4.26 x10E6/uL (ref 3.77–5.28)
RDW: 13.6 % (ref 11.7–15.4)
WBC: 5.9 10*3/uL (ref 3.4–10.8)

## 2023-01-22 LAB — TSH: TSH: 2.59 u[IU]/mL (ref 0.450–4.500)

## 2023-01-22 LAB — HEMOGLOBIN A1C
Est. average glucose Bld gHb Est-mCnc: 126 mg/dL
Hgb A1c MFr Bld: 6 % — ABNORMAL HIGH (ref 4.8–5.6)

## 2023-01-22 LAB — VITAMIN D 25 HYDROXY (VIT D DEFICIENCY, FRACTURES): Vit D, 25-Hydroxy: 29.4 ng/mL — ABNORMAL LOW (ref 30.0–100.0)

## 2023-01-28 ENCOUNTER — Ambulatory Visit: Admitting: Family Medicine

## 2023-01-28 ENCOUNTER — Encounter: Payer: Self-pay | Admitting: Family Medicine

## 2023-01-28 VITALS — BP 144/92 | HR 90 | Ht 59.0 in | Wt 143.0 lb

## 2023-01-28 DIAGNOSIS — M069 Rheumatoid arthritis, unspecified: Secondary | ICD-10-CM

## 2023-01-28 DIAGNOSIS — F324 Major depressive disorder, single episode, in partial remission: Secondary | ICD-10-CM

## 2023-01-28 DIAGNOSIS — E785 Hyperlipidemia, unspecified: Secondary | ICD-10-CM | POA: Diagnosis not present

## 2023-01-28 DIAGNOSIS — Z87891 Personal history of nicotine dependence: Secondary | ICD-10-CM | POA: Diagnosis not present

## 2023-01-28 DIAGNOSIS — I10 Essential (primary) hypertension: Secondary | ICD-10-CM | POA: Diagnosis not present

## 2023-01-28 DIAGNOSIS — K219 Gastro-esophageal reflux disease without esophagitis: Secondary | ICD-10-CM

## 2023-01-28 DIAGNOSIS — R7303 Prediabetes: Secondary | ICD-10-CM

## 2023-01-28 MED ORDER — CLONIDINE HCL 0.2 MG PO TABS: 0.2000 mg | ORAL_TABLET | Freq: Every day | ORAL | 1 refills | Status: DC

## 2023-01-28 MED ORDER — POTASSIUM CHLORIDE ER 10 MEQ PO TBCR
10.00 meq | EXTENDED_RELEASE_TABLET | Freq: Two times a day (BID) | ORAL | 3 refills | Status: AC
Start: 2023-01-28 — End: ?

## 2023-01-28 NOTE — Patient Instructions (Signed)
Annual exam early October , call if you need me before, flu vaccine at visit  Dose increase in clonidine to 0.2 mg 1 at bedtime.  A new prescription is sent in.   You may take two  0.1 mg tablets together of clonidine until you receive your new prescription for 0.2 mg one  tablet at bedtime.Blood pressure is still high  Dose increase in potassium take ONE tablet 2 times daily since your potassium remains low.  Need RSV vaccine , if you have not had it  Need covid vaccine when next available  Congrats on excellent blood sugar continued to quit being nicotine free and overall good health habits.  Please commit to 30 minutes of exercise 5 days/week.  Thanks for choosing [facility], we consider it a privelige to serve you.

## 2023-01-29 ENCOUNTER — Encounter: Payer: Self-pay | Admitting: Family Medicine

## 2023-01-29 NOTE — Progress Notes (Signed)
Jaime Murray     MRN: [id number]      DOB: 05-17-1956  Chief Complaint  Patient presents with   Hypertension    Follow up    HPI Jaime Murray is here for follow up and re-evaluation of chronic medical conditions, medication management and review of any available recent lab and radiology data.  Preventive health is updated, specifically  Cancer screening and Immunization.   Questions or concerns regarding consultations or procedures which the PT has had in the interim are  addressed. The PT denies any adverse reactions to current medications since the last visit.  There are no new concerns.  There are no specific complaints   ROS Denies recent fever or chills. Denies sinus pressure, nasal congestion, ear pain or sore throat. Denies chest congestion, productive cough or wheezing. Denies chest pains, palpitations and leg swelling Denies abdominal pain, nausea, vomiting,diarrhea or constipation.   Denies dysuria, frequency, hesitancy or incontinence. Denies joint pain, swelling and limitation in mobility. Denies headaches, seizures, numbness, or tingling. Denies depression, anxiety or insomnia. Denies skin break down or rash.   PE  BP (!) 144/92   Pulse 90   Ht 4\' 11"  (1.499 m)   Wt 143 lb 0.6 oz (64.9 kg)   SpO2 93%   BMI 28.89 kg/m   Patient alert and oriented and in no cardiopulmonary distress.  HEENT: No facial asymmetry, EOMI,     Neck supple .  Chest: Clear to auscultation bilaterally.  CVS: S1, S2 no murmurs, no S3.Regular rate.  ABD: Soft non tender.   Ext: No edema  MS: Adequate ROM spine, shoulders, hips and knees.  Skin: Intact, no ulcerations or rash noted.  Psych: Good eye contact, normal affect. Memory intact not anxious or depressed appearing.  CNS: CN 2-12 intact, power,  normal throughout.no focal deficits noted.   Assessment & Plan  Hypertension goal BP (blood pressure) < 130/80 Not at goal ,incease dose of clonidine DASH diet and  commitment to daily physical activity for a minimum of 30 minutes discussed and encouraged, as a part of hypertension management. The importance of attaining a healthy weight is also discussed.     01/28/2023    9:41 AM 01/28/2023    9:40 AM 01/28/2023    8:58 AM 11/19/2022    3:25 PM 11/19/2022    3:24 PM 04/09/2022    8:08 AM 01/09/2022   10:06 AM  BP/Weight  Systolic BP 144 144 131 152 147 119   Diastolic BP 92 90 85 88 81 76   Wt. (Lbs)   143.04  144 144.08 142  BMI   28.89 kg/m2  27.21 kg/m2 27.22 kg/m2 26.83 kg/m2       Hyperlipidemia LDL goal <100 Hyperlipidemia:Low fat diet discussed and encouraged.   Lipid Panel  Lab Results  Component Value Date   CHOL 192 01/21/2023   HDL 85 01/21/2023   LDLCALC 93 01/21/2023   TRIG 76 01/21/2023   CHOLHDL 2.3 01/21/2023     Controlled, no change in medication   H/O nicotine dependence Remains nicotine free, congratulated and encouraged to continue  Depression, major, single episode, in partial remission (HCC) Controlled, no change in medication   GERD Controlled, no change in medication   Rheumatoid arthritis (HCC) Improved on medication, managed by rheumatology  Prediabetes Patient educated about the importance of limiting  Carbohydrate intake , the need to commit to daily physical activity for a minimum of 30 minutes , and to commit weight  loss. The fact that changes in all these areas will reduce or eliminate all together the development of diabetes is stressed.  Marked improvement     Latest Ref Rng & Units 01/21/2023    9:21 AM 04/09/2022    9:06 AM 08/08/2021    9:19 AM 08/08/2021    9:14 AM 11/27/2020    8:36 AM  Diabetic Labs  HbA1c 4.8 - 5.6 % 6.0  6.4   5.9  6.2   Chol 100 - 199 mg/dL 098  119  147   829   HDL >39 mg/dL 85  70  88   66   Calc LDL 0 - 99 mg/dL 93  83  75   562   Triglycerides 0 - 149 mg/dL 76  91  61   130   Creatinine 0.57 - 1.00 mg/dL 8.65  7.84  6.96   2.95       01/28/2023    9:41 AM  01/28/2023    9:40 AM 01/28/2023    8:58 AM 11/19/2022    3:25 PM 11/19/2022    3:24 PM 04/09/2022    8:08 AM 01/09/2022   10:06 AM  BP/Weight  Systolic BP 144 144 131 152 147 119   Diastolic BP 92 90 85 88 81 76   Wt. (Lbs)   143.04  144 144.08 142  BMI   28.89 kg/m2  27.21 kg/m2 27.22 kg/m2 26.83 kg/m2      Latest Ref Rng & Units 04/08/2019    2:40 PM 09/01/2018   12:00 AM  Foot/eye exam completion dates  Eye Exam No Retinopathy  No Retinopathy      Foot Form Completion  Done      This result is from an external source.

## 2023-01-29 NOTE — Assessment & Plan Note (Signed)
Remains nicotine free, congratulated and encouraged to continue

## 2023-01-29 NOTE — Assessment & Plan Note (Signed)
Improved on medication, managed by rheumatology

## 2023-01-29 NOTE — Assessment & Plan Note (Signed)
Controlled, no change in medication  

## 2023-01-29 NOTE — Assessment & Plan Note (Signed)
Patient educated about the importance of limiting  Carbohydrate intake , the need to commit to daily physical activity for a minimum of 30 minutes , and to commit weight loss. The fact that changes in all these areas will reduce or eliminate all together the development of diabetes is stressed.  Marked improvement     Latest Ref Rng & Units 01/21/2023    9:21 AM 04/09/2022    9:06 AM 08/08/2021    9:19 AM 08/08/2021    9:14 AM 11/27/2020    8:36 AM  Diabetic Labs  HbA1c 4.8 - 5.6 % 6.0  6.4   5.9  6.2   Chol 100 - 199 mg/dL 161  096  045   409   HDL >39 mg/dL 85  70  88   66   Calc LDL 0 - 99 mg/dL 93  83  75   811   Triglycerides 0 - 149 mg/dL 76  91  61   914   Creatinine 0.57 - 1.00 mg/dL 7.82  9.56  2.13   0.86       01/28/2023    9:41 AM 01/28/2023    9:40 AM 01/28/2023    8:58 AM 11/19/2022    3:25 PM 11/19/2022    3:24 PM 04/09/2022    8:08 AM 01/09/2022   10:06 AM  BP/Weight  Systolic BP 144 144 131 152 147 119   Diastolic BP 92 90 85 88 81 76   Wt. (Lbs)   143.04  144 144.08 142  BMI   28.89 kg/m2  27.21 kg/m2 27.22 kg/m2 26.83 kg/m2      Latest Ref Rng & Units 04/08/2019    2:40 PM 09/01/2018   12:00 AM  Foot/eye exam completion dates  Eye Exam No Retinopathy  No Retinopathy      Foot Form Completion  Done      This result is from an external source.

## 2023-01-29 NOTE — Assessment & Plan Note (Signed)
Hyperlipidemia:Low fat diet discussed and encouraged.   Lipid Panel  Lab Results  Component Value Date   CHOL 192 01/21/2023   HDL 85 01/21/2023   LDLCALC 93 01/21/2023   TRIG 76 01/21/2023   CHOLHDL 2.3 01/21/2023     Controlled, no change in medication

## 2023-01-29 NOTE — Assessment & Plan Note (Signed)
Not at goal ,incease dose of clonidine DASH diet and commitment to daily physical activity for a minimum of 30 minutes discussed and encouraged, as a part of hypertension management. The importance of attaining a healthy weight is also discussed.     01/28/2023    9:41 AM 01/28/2023    9:40 AM 01/28/2023    8:58 AM 11/19/2022    3:25 PM 11/19/2022    3:24 PM 04/09/2022    8:08 AM 01/09/2022   10:06 AM  BP/Weight  Systolic BP 144 144 131 152 147 119   Diastolic BP 92 90 85 88 81 76   Wt. (Lbs)   143.04  144 144.08 142  BMI   28.89 kg/m2  27.21 kg/m2 27.22 kg/m2 26.83 kg/m2

## 2023-03-07 ENCOUNTER — Other Ambulatory Visit: Payer: Self-pay | Admitting: Family Medicine

## 2023-04-30 ENCOUNTER — Ambulatory Visit (HOSPITAL_COMMUNITY)
Admission: RE | Admit: 2023-04-30 | Discharge: 2023-04-30 | Disposition: A | Discharge status: Home / Self Care | Payer: Medicare Other | Source: Ambulatory Visit | Attending: Family Medicine | Admitting: Family Medicine

## 2023-04-30 DIAGNOSIS — Z1231 Encounter for screening mammogram for malignant neoplasm of breast: Secondary | ICD-10-CM | POA: Diagnosis present

## 2023-05-02 ENCOUNTER — Encounter: Payer: Self-pay | Admitting: Family Medicine

## 2023-05-02 ENCOUNTER — Ambulatory Visit: Payer: Medicare Other | Admitting: Family Medicine

## 2023-05-02 VITALS — BP 127/76 | HR 91 | Ht 61.0 in | Wt 145.1 lb

## 2023-05-02 DIAGNOSIS — Z9189 Other specified personal risk factors, not elsewhere classified: Secondary | ICD-10-CM

## 2023-05-02 DIAGNOSIS — M0589 Other rheumatoid arthritis with rheumatoid factor of multiple sites: Secondary | ICD-10-CM

## 2023-05-02 DIAGNOSIS — R7303 Prediabetes: Secondary | ICD-10-CM | POA: Diagnosis not present

## 2023-05-02 DIAGNOSIS — E663 Overweight: Secondary | ICD-10-CM

## 2023-05-02 DIAGNOSIS — Z122 Encounter for screening for malignant neoplasm of respiratory organs: Secondary | ICD-10-CM

## 2023-05-02 DIAGNOSIS — Z23 Encounter for immunization: Secondary | ICD-10-CM | POA: Diagnosis not present

## 2023-05-02 DIAGNOSIS — Z800 Family history of malignant neoplasm of digestive organs: Secondary | ICD-10-CM

## 2023-05-02 DIAGNOSIS — Z780 Asymptomatic menopausal state: Secondary | ICD-10-CM

## 2023-05-02 DIAGNOSIS — L84 Corns and callosities: Secondary | ICD-10-CM

## 2023-05-02 DIAGNOSIS — Z0100 Encounter for examination of eyes and vision without abnormal findings: Secondary | ICD-10-CM

## 2023-05-02 DIAGNOSIS — R9431 Abnormal electrocardiogram [ECG] [EKG]: Secondary | ICD-10-CM

## 2023-05-02 DIAGNOSIS — Z860100 Personal history of colon polyps, unspecified: Secondary | ICD-10-CM

## 2023-05-02 DIAGNOSIS — E785 Hyperlipidemia, unspecified: Secondary | ICD-10-CM | POA: Diagnosis not present

## 2023-05-02 DIAGNOSIS — I10 Essential (primary) hypertension: Secondary | ICD-10-CM | POA: Diagnosis not present

## 2023-05-02 DIAGNOSIS — Z87891 Personal history of nicotine dependence: Secondary | ICD-10-CM

## 2023-05-02 NOTE — Assessment & Plan Note (Signed)
Agrees to lung cancer screening

## 2023-05-02 NOTE — Progress Notes (Signed)
Jaime Murray     MRN: [id number]      DOB: 09-04-1955  Chief Complaint  Patient presents with   Foot Problem    Reports dry feet, hard skin would like something to help with this.    Follow-up   Care Management    HPI Jaime Murray is here for follow up and re-evaluation of chronic medical conditions, medication management and review of any available recent lab and radiology data.  Preventive health is updated, specifically  Cancer screening and Immunization.   Questions or concerns regarding consultations or procedures which the PT has had in the interim are  addressed. The PT denies any adverse reactions to current medications since the last visit.  C/o bilateral foot poin on soles with painful callus on instep also overlapping great and 2nd toes, left worse than right, painful lesions I on skin between toes, has to wear a protective shield between the toes, also has bunions which she wants attended to as well   ROS Denies recent fever or chills. Denies sinus pressure, nasal congestion, ear pain or sore throat. Denies chest congestion, productive cough or wheezing. Denies chest pains, palpitations and leg swelling Denies abdominal pain, nausea, vomiting,diarrhea or constipation.   Denies dysuria, frequency, hesitancy or incontinence. Denies uncontrolled  joint pain, swelling and limitation in mobility. Denies headaches, seizures, numbness, or tingling. Denies uncontrolled depression, anxiety or insomnia. Denies skin break down or rash.   PE  BP 127/76   Pulse 91   Ht 5\' 1"  (1.549 m)   Wt 145 lb 1.3 oz (65.8 kg)   SpO2 92%   BMI 27.41 kg/m   Patient alert and oriented and in no cardiopulmonary distress.  HEENT: No facial asymmetry, EOMI,     Neck supple .  Chest: Clear to auscultation bilaterally.  CVS: S1, S2 no murmurs, no S3.Regular rate.  ABD: Soft non tender.   Ext: No edema  MS: Adequate ROM spine, shoulders, hips and knees.Bilateral bunions, hammer  toes and calluses on feet  Skin: Intact, bilateral calluses.  Psych: Good eye contact, normal affect. Memory intact not anxious or depressed appearing.  CNS: CN 2-12 intact, power,  normal throughout.no focal deficits noted.   Assessment & Plan  H/O nicotine dependence Agrees to lung cancer screening  Hypertension goal BP (blood pressure) < 130/80 Controlled, no change in medication DASH diet and commitment to daily physical activity for a minimum of 30 minutes discussed and encouraged, as a part of hypertension management. The importance of attaining a healthy weight is also discussed.     05/02/2023    8:02 AM 01/28/2023    9:41 AM 01/28/2023    9:40 AM 01/28/2023    8:58 AM 11/19/2022    3:25 PM 11/19/2022    3:24 PM 04/09/2022    8:08 AM  BP/Weight  Systolic BP 127 144 144 131 152 147 119  Diastolic BP 76 92 90 85 88 81 76  Wt. (Lbs) 145.08   143.04  144 144.08  BMI 27.41 kg/m2   28.89 kg/m2  27.21 kg/m2 27.22 kg/m2       Hyperlipidemia LDL goal <100 Hyperlipidemia:Low fat diet discussed and encouraged.   Lipid Panel  Lab Results  Component Value Date   CHOL 192 01/21/2023   HDL 85 01/21/2023   LDLCALC 93 01/21/2023   TRIG 76 01/21/2023   CHOLHDL 2.3 01/21/2023     Controlled, no change in medication   Overweight (BMI 25.0-29.9)  Patient  re-educated about  the importance of commitment to a  minimum of 150 minutes of exercise per week as able.  The importance of healthy food choices with portion control discussed, as well as eating regularly and within a 12 hour window most days. The need to choose "clean , green" food 50 to 75% of the time is discussed, as well as to make water the primary drink and set a goal of 64 ounces water daily.       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 The fact that changes in all these areas will reduce or eliminate all together the development of diabetes is stressed.  Improved     Latest Ref Rng & Units 01/21/2023    9:21 AM 04/09/2022    9:06 AM 08/08/2021    9:19 AM 08/08/2021    9:14 AM 11/27/2020    8:36 AM  Diabetic Labs  HbA1c 4.8 - 5.6 % 6.0  6.4   5.9  6.2   Chol 100 - 199 mg/dL 161  096  045   409   HDL >39 mg/dL 85  70  88   66   Calc LDL 0 - 99 mg/dL 93  83  75   811   Triglycerides 0 - 149 mg/dL 76  91  61   914   Creatinine 0.57 - 1.00 mg/dL 7.82  9.56  2.13   0.86       05/02/2023    8:02 AM 01/28/2023    9:41 AM 01/28/2023    9:40 AM 01/28/2023    8:58 AM 11/19/2022    3:25 PM 11/19/2022    3:24 PM 04/09/2022    8:08 AM  BP/Weight  Systolic BP 127 144 144 131 152 147 119  Diastolic BP 76 92 90 85 88 81 76  Wt. (Lbs) 145.08   143.04  144 144.08  BMI 27.41 kg/m2   28.89 kg/m2  27.21 kg/m2 27.22 kg/m2      Latest Ref Rng & Units 04/08/2019    2:40 PM 09/01/2018   12:00 AM  Foot/eye exam completion dates  Eye Exam No Retinopathy  No Retinopathy      Foot Form Completion  Done      This result is from an external source.      Callus of foot Painful bilateral foot calluses, bunions ans hammer toes, refer Podiatry  Immunization due After obtaining informed consent, the  Pneumonia 20 vaccine is  administered , with no adverse effect noted at the time of administration.   Abnormal EKG NSR, no lVH, t wave abn c/w ischemia, not seen I prior EKG, refer Cardiology, also she has inc risk of heat dz based on co morbidities  At increased risk for cardiovascular  disease Hypertension, hyperlipidemia, DB, prior nicotine use , current EKG suggestive ofprior infarct, refer to Cardiology for further eval

## 2023-05-02 NOTE — Patient Instructions (Addendum)
Follow-up mid March, call if you need me sooner.  Congratulations on excellent health habits.  Pneumonia 20 vaccine in office today.  EKG in office today.  You are referred for bone density testing as well as lung cancer screening.  You are being referred to podiatry because of calluses in the feet and overlapping toes.  I will refer you for eye exam  Please do give additional consideration to completing a living well we will provide information on this in writing.  You are being referred for an eye exam which you should have every at least once per year based on the medications you take for your rheumatoid arthritis.    Fasting lipid CMP and EGFR to be obtained 3 to 5 days before your next visit also and HbA1c.   Increase vit D to two daily Best wishes for the end of this year and 2025, my hope is that you continue to enjoy very good and even better health.  Thanks for choosing [facility], we consider it a privelige to serve you.

## 2023-05-03 DIAGNOSIS — Z23 Encounter for immunization: Secondary | ICD-10-CM | POA: Insufficient documentation

## 2023-05-03 DIAGNOSIS — Z9189 Other specified personal risk factors, not elsewhere classified: Secondary | ICD-10-CM | POA: Insufficient documentation

## 2023-05-03 DIAGNOSIS — L84 Corns and callosities: Secondary | ICD-10-CM | POA: Insufficient documentation

## 2023-05-03 DIAGNOSIS — R9431 Abnormal electrocardiogram [ECG] [EKG]: Secondary | ICD-10-CM | POA: Insufficient documentation

## 2023-05-03 NOTE — Assessment & Plan Note (Signed)
Controlled, no change in medication DASH diet and commitment to daily physical activity for a minimum of 30 minutes discussed and encouraged, as a part of hypertension management. The importance of attaining a healthy weight is also discussed.     05/02/2023    8:02 AM 01/28/2023    9:41 AM 01/28/2023    9:40 AM 01/28/2023    8:58 AM 11/19/2022    3:25 PM 11/19/2022    3:24 PM 04/09/2022    8:08 AM  BP/Weight  Systolic BP 127 144 144 131 152 147 119  Diastolic BP 76 92 90 85 88 81 76  Wt. (Lbs) 145.08   143.04  144 144.08  BMI 27.41 kg/m2   28.89 kg/m2  27.21 kg/m2 27.22 kg/m2

## 2023-05-03 NOTE — Assessment & Plan Note (Signed)
Hypertension, hyperlipidemia, DB, prior nicotine use , current EKG suggestive ofprior infarct, refer to Cardiology for further eval

## 2023-05-03 NOTE — Assessment & Plan Note (Signed)
  Patient re-educated about  the importance of commitment to a  minimum of 150 minutes of exercise per week as able.  The importance of healthy food choices with portion control discussed, as well as eating regularly and within a 12 hour window most days. The need to choose "clean , green" food 50 to 75% of the time is discussed, as well as to make water the primary drink and set a goal of 64 ounces water daily.       05/02/2023    8:02 AM 01/28/2023    8:58 AM 11/19/2022    3:24 PM  Weight /BMI  Weight 145 lb 1.3 oz 143 lb 0.6 oz 144 lb  Height 5\' 1"  (1.549 m) 4\' 11"  (1.499 m) 5\' 1"  (1.549 m)  BMI 27.41 kg/m2 28.89 kg/m2 27.21 kg/m2    Unchanged

## 2023-05-03 NOTE — Assessment & Plan Note (Signed)
NSR, no lVH, t wave abn c/w ischemia, not seen I prior EKG, refer Cardiology, also she has inc risk of heat dz based on co morbidities

## 2023-05-03 NOTE — Assessment & Plan Note (Signed)
After obtaining informed consent, the  Pneumonia 20 vaccine is  administered , with no adverse effect noted at the time of administration.

## 2023-05-03 NOTE — Assessment & Plan Note (Signed)
Painful bilateral foot calluses, bunions ans hammer toes, refer Podiatry

## 2023-05-03 NOTE — Assessment & Plan Note (Signed)
Patient educated about the importance of limiting  Carbohydrate intake , the need to commit to daily physical activity for a minimum of 30 minutes , and to commit weight loss. The fact that changes in all these areas will reduce or eliminate all together the development of diabetes is stressed.  Improved     Latest Ref Rng & Units 01/21/2023    9:21 AM 04/09/2022    9:06 AM 08/08/2021    9:19 AM 08/08/2021    9:14 AM 11/27/2020    8:36 AM  Diabetic Labs  HbA1c 4.8 - 5.6 % 6.0  6.4   5.9  6.2   Chol 100 - 199 mg/dL 213  086  578   469   HDL >39 mg/dL 85  70  88   66   Calc LDL 0 - 99 mg/dL 93  83  75   629   Triglycerides 0 - 149 mg/dL 76  91  61   528   Creatinine 0.57 - 1.00 mg/dL 4.13  2.44  0.10   2.72       05/02/2023    8:02 AM 01/28/2023    9:41 AM 01/28/2023    9:40 AM 01/28/2023    8:58 AM 11/19/2022    3:25 PM 11/19/2022    3:24 PM 04/09/2022    8:08 AM  BP/Weight  Systolic BP 127 144 144 131 152 147 119  Diastolic BP 76 92 90 85 88 81 76  Wt. (Lbs) 145.08   143.04  144 144.08  BMI 27.41 kg/m2   28.89 kg/m2  27.21 kg/m2 27.22 kg/m2      Latest Ref Rng & Units 04/08/2019    2:40 PM 09/01/2018   12:00 AM  Foot/eye exam completion dates  Eye Exam No Retinopathy  No Retinopathy      Foot Form Completion  Done      This result is from an external source.

## 2023-05-03 NOTE — Assessment & Plan Note (Signed)
Hyperlipidemia:Low fat diet discussed and encouraged.   Lipid Panel  Lab Results  Component Value Date   CHOL 192 01/21/2023   HDL 85 01/21/2023   LDLCALC 93 01/21/2023   TRIG 76 01/21/2023   CHOLHDL 2.3 01/21/2023     Controlled, no change in medication

## 2023-05-03 NOTE — Assessment & Plan Note (Signed)
Controlled, no change in medication Managed by Rheumatology

## 2023-05-05 ENCOUNTER — Telehealth: Payer: Self-pay | Admitting: Family Medicine

## 2023-05-05 NOTE — Telephone Encounter (Signed)
Patient is requesting her referral be changed to the [place] office if possible for [facility] she states doesn't drive in [place].

## 2023-05-06 NOTE — Telephone Encounter (Signed)
aPPT SCHEDULED IN [place]

## 2023-05-26 ENCOUNTER — Encounter: Payer: Self-pay | Admitting: Podiatry

## 2023-05-26 ENCOUNTER — Ambulatory Visit (INDEPENDENT_AMBULATORY_CARE_PROVIDER_SITE_OTHER): Payer: Medicare Other | Admitting: Podiatry

## 2023-05-26 VITALS — Ht 61.0 in | Wt 145.0 lb

## 2023-05-26 DIAGNOSIS — L309 Dermatitis, unspecified: Secondary | ICD-10-CM

## 2023-05-26 DIAGNOSIS — M21619 Bunion of unspecified foot: Secondary | ICD-10-CM

## 2023-05-26 MED ORDER — CLOTRIMAZOLE-BETAMETHASONE 1-0.05 % EX CREA: 1.0000 | TOPICAL_CREAM | Freq: Every day | CUTANEOUS | 0 refills | Status: DC

## 2023-05-26 MED ORDER — TERBINAFINE HCL 250 MG PO TABS: 250.0000 mg | ORAL_TABLET | Freq: Every day | ORAL | 0 refills | Status: DC

## 2023-05-26 NOTE — Progress Notes (Signed)
Subjective:   Patient ID: Jaime Murray, female   DOB: 67 y.o.   MRN: [id number]   HPI Patient presents with plantar irritation of the arch bilateral that she thinks has been there for around 6 months and bunions of both feet.  States it itches on the bottom.  Patient does not smoke likes to be active   Review of Systems  All other systems reviewed and are negative.       Objective:  Physical Exam Vitals and nursing note reviewed.  Constitutional:      Appearance: She is well-developed.  Pulmonary:     Effort: Pulmonary effort is normal.  Musculoskeletal:        General: Normal range of motion.  Skin:    General: Skin is warm.  Neurological:     Mental Status: She is alert.     Neurovascular status intact muscle strength was found to be adequate range of motion adequate with patient found to have large bunion deformity both feet and areas on the plantar aspect of each arch measuring about 1.5 x 1.5 cm dry crusted material minimal blistering noted.     Assessment:  Probability that this is some kind of skin condition possible fungal infection possible eczema or other soft tissue condition     Plan:  H&P reviewed at this point organ to start her on combination of Lotrisone and 30 days of oral antifungal with patient having good liver function.  I discussed bunions do not recommend surgery currently but may be necessary someday

## 2023-05-28 ENCOUNTER — Other Ambulatory Visit: Payer: Self-pay | Admitting: *Deleted

## 2023-05-28 DIAGNOSIS — Z122 Encounter for screening for malignant neoplasm of respiratory organs: Secondary | ICD-10-CM

## 2023-05-28 DIAGNOSIS — Z87891 Personal history of nicotine dependence: Secondary | ICD-10-CM

## 2023-05-28 DIAGNOSIS — F17210 Nicotine dependence, cigarettes, uncomplicated: Secondary | ICD-10-CM

## 2023-05-29 ENCOUNTER — Telehealth: Payer: Self-pay | Admitting: Acute Care

## 2023-05-29 NOTE — Telephone Encounter (Signed)
CT has been rescheduled.

## 2023-05-29 NOTE — Telephone Encounter (Signed)
Ct has a Lung Can[place] Screening CT on November 30th but needs for it to be rescheduled.

## 2023-06-04 ENCOUNTER — Encounter: Payer: Self-pay | Admitting: Acute Care

## 2023-06-04 ENCOUNTER — Ambulatory Visit: Payer: Medicare Other | Admitting: Acute Care

## 2023-06-04 DIAGNOSIS — F17210 Nicotine dependence, cigarettes, uncomplicated: Secondary | ICD-10-CM | POA: Diagnosis not present

## 2023-06-04 NOTE — Patient Instructions (Signed)

## 2023-06-04 NOTE — Progress Notes (Signed)
  Virtual Visit via Telephone Note  I connected with Jaime Murray on 06/04/23 at  9:00 AM EST by telephone and verified that I am speaking with the correct person using two identifiers.  Location: Patient:  At home Provider: [address] [place], [place], [address]    I discussed the limitations, risks, security and privacy concerns of performing an evaluation and management service by telephone and the availability of in person appointments. I also discussed with the patient that there may be a patient responsible charge related to this service. The patient expressed understanding and agreed to proceed.    Shared Decision Making Visit Lung Cancer Screening Program [phone])   Eligibility: Age 16 y.o. Pack Years Smoking History Calculation 20 pack year smoking history (# packs/per year x # years smoked) Recent History of coughing up blood  no Unexplained weight loss? no ( >Than 15 pounds within the last 6 months ) Prior History Lung / other cancer no (Diagnosis within the last 5 years already requiring surveillance chest CT Scans). Smoking Status Current Smoker Former Smokers: Years since quit:  NA  Quit Date:  NA  Visit Components: Discussion included one or more decision making aids. yes Discussion included risk/benefits of screening. yes Discussion included potential follow up diagnostic testing for abnormal scans. yes Discussion included meaning and risk of over diagnosis. yes Discussion included meaning and risk of False Positives. yes Discussion included meaning of total radiation exposure. yes  Counseling Included: Importance of adherence to annual lung cancer LDCT screening. yes Impact of comorbidities on ability to participate in the program. yes Ability and willingness to under diagnostic treatment. yes  Smoking Cessation Counseling: Current Smokers:  Discussed importance of smoking cessation. yes Information about tobacco cessation classes and  interventions provided to patient. yes Patient provided with "ticket" for LDCT Scan. yes Symptomatic Patient. no  Counseling NA Diagnosis Code: Tobacco Use Z72.0 Asymptomatic Patient yes  Counseling (Intermediate counseling: > three minutes counseling) P2951 Former Smokers:  Discussed the importance of maintaining cigarette abstinence. yes Diagnosis Code: Personal History of Nicotine Dependence. O84.166 Information about tobacco cessation classes and interventions provided to patient. Yes Patient provided with "ticket" for LDCT Scan. yes Written Order for Lung Cancer Screening with LDCT placed in Epic. Yes (CT Chest Lung Cancer Screening Low Dose W/O CM) [id number] Z12.2-Screening of respiratory organs Z87.891-Personal history of nicotine dependence  Smoking cessation counseling x 3 monutes   Bevelyn Ngo, NP 06/04/2023

## 2023-06-11 ENCOUNTER — Ambulatory Visit (HOSPITAL_COMMUNITY): Payer: TRICARE For Life (TFL)

## 2023-06-21 ENCOUNTER — Ambulatory Visit (HOSPITAL_COMMUNITY): Payer: TRICARE For Life (TFL)

## 2023-06-25 ENCOUNTER — Encounter: Payer: Self-pay | Admitting: Internal Medicine

## 2023-06-25 ENCOUNTER — Ambulatory Visit: Payer: Medicare Other | Attending: Internal Medicine | Admitting: Internal Medicine

## 2023-06-25 VITALS — BP 120/70 | HR 82 | Ht 59.0 in | Wt 141.2 lb

## 2023-06-25 DIAGNOSIS — R9431 Abnormal electrocardiogram [ECG] [EKG]: Secondary | ICD-10-CM | POA: Diagnosis present

## 2023-06-25 NOTE — Progress Notes (Signed)
Cardiology Office Note  Date: 06/25/2023   ID: Jaime Murray, DOB 1956/03/27, MRN [id number]  PCP:  Kerri Perches, MD  Cardiologist:  Marjo Bicker, MD Electrophysiologist:  None   History of Present Illness: Jaime Murray is a 67 y.o. female known to have HTN, HLD was referred to cardiology clinic for evaluation of abnormal EKG.  EKG today showed NSR, septal infarct.  I reviewed the prior EKGs that showed normal sinus rhythm and no evidence of anterior infarct (although computer read as anterior infarct).  She denies having any angina, exercises for a few times per week and denies having any symptoms of angina.  She does endorse sharp chest pain lasting for 1 second.  No symptoms of DOE, orthopnea, PND, dizziness, syncope, leg swelling.  No prior history of CAD.  No family history of premature CAD.   Past Medical History:  Diagnosis Date   Anxiety    Arthritis    Constipation    Depression    Fatigue    GERD (gastroesophageal reflux disease)    HTN (hypertension)    Hypercholesteremia    IDA (iron deficiency anemia)     Past Surgical History:  Procedure Laterality Date   BIOPSY  11/12/2021   Procedure: BIOPSY;  Surgeon: Corbin Ade, MD;  Location: [facility];  Service: Endoscopy;;   COLONOSCOPY  01/01/2010   Dr. Calla Kicks and sigmoid polyps, diminutive rectosigmoid polyp, tubular adenomas   COLONOSCOPY N/A 12/06/2015   normal   COLONOSCOPY WITH PROPOFOL N/A 11/12/2021   noncompliant and redundant colon, small sigmoid polyp s/p removal. Benign. 5 year surveillance due to family history.   ESOPHAGOGASTRODUODENOSCOPY  12/14/2009   Dr. Rourk:tight schatzki's ring s/p dilation/moderate size hiatal hernia   ESOPHAGOGASTRODUODENOSCOPY N/A 03/20/2020   Procedure: ESOPHAGOGASTRODUODENOSCOPY (EGD);  Surgeon: Gaynelle Adu, MD;  Location: [facility];  Service: General;  Laterality: N/A;   GANGLION CYST EXCISION  05/22/2012   Procedure: REMOVAL GANGLION OF  WRIST;  Surgeon: Vickki Hearing, MD;  Location: [facility];  Service: Orthopedics;  Laterality: Left;   POLYPECTOMY  11/12/2021   Procedure: POLYPECTOMY;  Surgeon: Corbin Ade, MD;  Location: [facility];  Service: Endoscopy;;   XI ROBOTIC ASSISTED PARAESOPHAGEAL HERNIA REPAIR N/A 03/20/2020   Procedure: ROBOTIC REPAIR OF LARGE PARAESOPHAGEAL HIATAL HERNIA WITH MESH AND GASTROPEXY AND PARTIAL FUNDOPLICATION;  Surgeon: Gaynelle Adu, MD;  Location: [facility];  Service: General;  Laterality: N/A;    Current Outpatient Medications  Medication Sig Dispense Refill   acetaminophen (TYLENOL) 325 MG tablet Take 650 mg by mouth every 6 (six) hours as needed for moderate pain.     amLODipine (NORVASC) 5 MG tablet Take 1 tablet (5 mg total) by mouth daily. 90 tablet 3   cetirizine (ZYRTEC) 10 MG tablet TAKE 1 TABLET DAILY (NEED APPOINTMENT BEFORE NEXT REFILL) 30 tablet 11   cloNIDine (CATAPRES) 0.2 MG tablet Take 1 tablet (0.2 mg total) by mouth at bedtime. 90 tablet 1   Cod Liver Oil w/Vit A & D CAPS Take 1 capsule by mouth daily.     fluticasone (FLONASE) 50 MCG/ACT nasal spray USE 2 SPRAYS IN EACH NOSTRIL DAILY 48 g 3   folic acid (FOLVITE) 1 MG tablet Take 1 mg by mouth daily.     hydroxypropyl methylcellulose / hypromellose (ISOPTO TEARS / GONIOVISC) 2.5 % ophthalmic solution Place 1 drop into both eyes 3 (three) times daily as needed for dry eyes.     lovastatin (MEVACOR)  40 MG tablet TAKE 1 TABLET AT BEDTIME 90 tablet 3   Omega-3 Fatty Acids (FISH OIL) 1000 MG CPDR Take by mouth daily at 6 (six) AM.     pantoprazole (PROTONIX) 40 MG tablet TAKE 1 TABLET TWICE A DAY BEFORE MEALS 180 tablet 3   PLAQUENIL 200 MG tablet Take 200 mg by mouth 2 (two) times daily.     potassium chloride (KLOR-CON 10) 10 MEQ tablet Take 1 tablet (10 mEq total) by mouth 2 (two) times daily. 180 tablet 3   Probiotic Product (PROBIOTIC DAILY) CAPS Take 1 capsule by mouth daily.     sertraline (ZOLOFT) 100 MG tablet TAKE  ONE-HALF (1/2) TABLET DAILY 45 tablet 3   terbinafine (LAMISIL) 250 MG tablet Take 1 tablet (250 mg total) by mouth daily. 30 tablet 0   VITAMIN D PO Take 1 capsule by mouth daily.     clotrimazole-betamethasone (LOTRISONE) cream Apply 1 Application topically daily. 30 g 0   Methotrexate Sodium (METHOTREXATE, PF,) 50 MG/2ML injection Inject 20 mg into the muscle every Sunday. (Patient not taking: Reported on 06/25/2023)     No current facility-administered medications for this visit.   Allergies:  Gabapentin   Social History: The patient  reports that she has quit smoking. Her smoking use included cigarettes. She has a 7.5 pack-year smoking history. She has never used smokeless tobacco. She reports current alcohol use. She reports that she does not use drugs.   Family History: The patient's family history includes Arthritis in an other family member; Colon cancer in her mother; Heart disease in an other family member; Hypertension in her father and mother; Stroke in her father.   ROS:  Please see the history of present illness. Otherwise, complete review of systems is positive for none  All other systems are reviewed and negative.   Physical Exam: VS:  BP 120/70   Pulse 82   Ht 4\' 11"  (1.499 m)   Wt 141 lb 3.2 oz (64 kg)   SpO2 94%   BMI 28.52 kg/m , BMI Body mass index is 28.52 kg/m.  Wt Readings from Last 3 Encounters:  06/25/23 141 lb 3.2 oz (64 kg)  05/26/23 145 lb (65.8 kg)  05/02/23 145 lb 1.3 oz (65.8 kg)    General: Patient appears comfortable at rest. HEENT: Conjunctiva and lids normal, oropharynx clear with moist mucosa. Neck: Supple, no elevated JVP or carotid bruits, no thyromegaly. Lungs: Clear to auscultation, nonlabored breathing at rest. Cardiac: Regular rate and rhythm, no S3 or significant systolic murmur, no pericardial rub. Abdomen: Soft, nontender, no hepatomegaly, bowel sounds present, no guarding or rebound. Extremities: No pitting edema, distal pulses  2+. Skin: Warm and dry. Musculoskeletal: No kyphosis. Neuropsychiatric: Alert and oriented x3, affect grossly appropriate.  Recent Labwork: 01/21/2023: ALT 22; AST 21; BUN 7; Creatinine, Ser 0.58; Hemoglobin 12.3; Platelets 204; Potassium 3.1; Sodium 145; TSH 2.590     Component Value Date/Time   CHOL 192 01/21/2023 0921   TRIG 76 01/21/2023 0921   HDL 85 01/21/2023 0921   CHOLHDL 2.3 01/21/2023 0921   CHOLHDL 2.9 09/30/2019 0807   VLDL 20 11/28/2016 0718   LDLCALC 93 01/21/2023 0921   LDLCALC 105 (H) 09/30/2019 0807     Assessment and Plan:  Abnormal EKG: EKG today showed normal sinus rhythm and septal infarct which is a nonspecific finding.  She has noncardiac sharp chest pain lasting for 1 to 2 seconds but denies having any chest pain with exertion.  Exercises  few times per week with no symptoms of angina.  No further cardiac workup is indicated at this time.  HTN, controlled: Continue current antihypertensives, amlodipine 5 mg once daily and clonidine, follows with PCP.  HLD, at goal: Continue lovastatin 40 mg nightly.  Goal LDL less than 100.       Medication Adjustments/Labs and Tests Ordered: Current medicines are reviewed at length with the patient today.  Concerns regarding medicines are outlined above.    Disposition:  Follow up prn  Signed Alyson Ki Verne Spurr, MD, 06/25/2023 2:12 PM    [facility] [address] [place], [place], [place] [address]

## 2023-06-25 NOTE — Patient Instructions (Signed)
Medication Instructions:  Your physician recommends that you continue on your current medications as directed. Please refer to the Current Medication list given to you today.   Labwork: None  Testing/Procedures: None  Follow-Up: Your physician recommends that you schedule a follow-up appointment in: As needed  Any Other Special Instructions Will Be Listed Below (If Applicable). Thank you for choosing [facility]!      If you need a refill on your cardiac medications before your next appointment, please call your pharmacy.

## 2023-06-27 ENCOUNTER — Ambulatory Visit (HOSPITAL_COMMUNITY): Admission: RE | Admit: 2023-06-27 | Payer: Medicare Other | Source: Ambulatory Visit

## 2023-07-09 ENCOUNTER — Other Ambulatory Visit: Payer: Self-pay | Admitting: Family Medicine

## 2023-09-03 ENCOUNTER — Encounter: Payer: Self-pay | Admitting: Acute Care

## 2023-10-03 ENCOUNTER — Ambulatory Visit (INDEPENDENT_AMBULATORY_CARE_PROVIDER_SITE_OTHER): Payer: Medicare Other | Admitting: Family Medicine

## 2023-10-03 ENCOUNTER — Telehealth: Payer: Self-pay | Admitting: Family Medicine

## 2023-10-03 ENCOUNTER — Encounter: Payer: Self-pay | Admitting: Family Medicine

## 2023-10-03 VITALS — BP 114/72 | HR 83 | Ht 59.0 in | Wt 143.1 lb

## 2023-10-03 DIAGNOSIS — I10 Essential (primary) hypertension: Secondary | ICD-10-CM | POA: Diagnosis not present

## 2023-10-03 DIAGNOSIS — R7303 Prediabetes: Secondary | ICD-10-CM

## 2023-10-03 DIAGNOSIS — Z780 Asymptomatic menopausal state: Secondary | ICD-10-CM | POA: Diagnosis not present

## 2023-10-03 DIAGNOSIS — M0589 Other rheumatoid arthritis with rheumatoid factor of multiple sites: Secondary | ICD-10-CM | POA: Diagnosis not present

## 2023-10-03 DIAGNOSIS — E559 Vitamin D deficiency, unspecified: Secondary | ICD-10-CM | POA: Diagnosis not present

## 2023-10-03 DIAGNOSIS — E785 Hyperlipidemia, unspecified: Secondary | ICD-10-CM

## 2023-10-03 DIAGNOSIS — Z1231 Encounter for screening mammogram for malignant neoplasm of breast: Secondary | ICD-10-CM

## 2023-10-03 DIAGNOSIS — M069 Rheumatoid arthritis, unspecified: Secondary | ICD-10-CM

## 2023-10-03 DIAGNOSIS — E663 Overweight: Secondary | ICD-10-CM

## 2023-10-03 MED ORDER — CETIRIZINE HCL 10 MG PO TABS: 10.0000 mg | ORAL_TABLET | Freq: Every day | ORAL | 1 refills | Status: DC

## 2023-10-03 NOTE — Telephone Encounter (Signed)
 Attempted to schedule mammo for pt- pt would like to wait to schedule given she is not due until October.

## 2023-10-03 NOTE — Assessment & Plan Note (Signed)
  Patient re-educated about  the importance of commitment to a  minimum of 150 minutes of exercise per week as able.  The importance of healthy food choices with portion control discussed, as well as eating regularly and within a 12 hour window most days. The need to choose "clean , green" food 50 to 75% of the time is discussed, as well as to make water the primary drink and set a goal of 64 ounces water daily.       10/03/2023    8:15 AM 06/25/2023    1:14 PM 05/26/2023   11:07 AM  Weight /BMI  Weight 143 lb 1.9 oz 141 lb 3.2 oz 145 lb  Height 4\' 11"  (1.499 m) 4\' 11"  (1.499 m) 5\' 1"  (1.549 m)  BMI 28.91 kg/m2 28.52 kg/m2 27.4 kg/m2

## 2023-10-03 NOTE — Assessment & Plan Note (Signed)
 Discontinued medications reported 09/2023

## 2023-10-03 NOTE — Assessment & Plan Note (Signed)
 Patient educated about the importance of limiting  Carbohydrate intake , the need to commit to daily physical activity for a minimum of 30 minutes , and to commit weight loss. The fact that changes in all these areas will reduce or eliminate all together the development of diabetes is stressed.      Latest Ref Rng & Units 01/21/2023    9:21 AM 04/09/2022    9:06 AM 08/08/2021    9:19 AM 08/08/2021    9:14 AM 11/27/2020    8:36 AM  Diabetic Labs  HbA1c 4.8 - 5.6 % 6.0  6.4   5.9  6.2   Chol 100 - 199 mg/dL 308  657  846   962   HDL >39 mg/dL 85  70  88   66   Calc LDL 0 - 99 mg/dL 93  83  75   952   Triglycerides 0 - 149 mg/dL 76  91  61   841   Creatinine 0.57 - 1.00 mg/dL 3.24  4.01  0.27   2.53       10/03/2023    8:15 AM 06/25/2023    1:14 PM 05/26/2023   11:07 AM 05/02/2023    8:02 AM 01/28/2023    9:41 AM 01/28/2023    9:40 AM 01/28/2023    8:58 AM  BP/Weight  Systolic BP 114 120  127 144 144 131  Diastolic BP 72 70  76 92 90 85  Wt. (Lbs) 143.12 141.2 145 145.08   143.04  BMI 28.91 kg/m2 28.52 kg/m2 27.4 kg/m2 27.41 kg/m2   28.89 kg/m2      Latest Ref Rng & Units 04/08/2019    2:40 PM 09/01/2018   12:00 AM  Foot/eye exam completion dates  Eye Exam No Retinopathy  No Retinopathy      Foot Form Completion  Done      This result is from an external source.    Updated lab needed at/ before next visit.

## 2023-10-03 NOTE — Assessment & Plan Note (Signed)
 Hyperlipidemia:Low fat diet discussed and encouraged.   Lipid Panel  Lab Results  Component Value Date   CHOL 192 01/21/2023   HDL 85 01/21/2023   LDLCALC 93 01/21/2023   TRIG 76 01/21/2023   CHOLHDL 2.3 01/21/2023     Updated lab needed at/ before next visit.

## 2023-10-03 NOTE — Assessment & Plan Note (Signed)
 Updated lab needed at/ before next visit.

## 2023-10-03 NOTE — Patient Instructions (Addendum)
 Welcome to medicare with MD, in May or June   Annual exam with MD in early October, call if you need me  before  Please schedule mammogram and dexa at checkout, wants dexa no sooner than May  Labs today as ordered  Congrats on retirement and your new healthy lifestyle commitment, all the best!  Thanks for choosing [facility], we consider it a privelige to serve you.

## 2023-10-03 NOTE — Assessment & Plan Note (Signed)
 Controlled, no change in medication DASH diet and commitment to daily physical activity for a minimum of 30 minutes discussed and encouraged, as a part of hypertension management. The importance of attaining a healthy weight is also discussed.     10/03/2023    8:15 AM 06/25/2023    1:14 PM 05/26/2023   11:07 AM 05/02/2023    8:02 AM 01/28/2023    9:41 AM 01/28/2023    9:40 AM 01/28/2023    8:58 AM  BP/Weight  Systolic BP 114 120  127 144 144 131  Diastolic BP 72 70  76 92 90 85  Wt. (Lbs) 143.12 141.2 145 145.08   143.04  BMI 28.91 kg/m2 28.52 kg/m2 27.4 kg/m2 27.41 kg/m2   28.89 kg/m2

## 2023-10-03 NOTE — Progress Notes (Signed)
 Jaime Murray     MRN: [id number]      DOB: [birth year]  Chief Complaint  Patient presents with   Medical Management of Chronic Issues    HPI Jaime Murray is here for follow up and re-evaluation of chronic medical conditions, medication management and review of any available recent lab and radiology data.  Preventive health is updated, specifically  Cancer screening and Immunization.   Questions or concerns regarding consultations or procedures which the PT has had in the interim are  addressed. The PT denies any adverse reactions to current medications since the last visit.  There are no new concerns.  There are no specific complaints   ROS Denies recent fever or chills. Denies sinus pressure, nasal congestion, ear pain or sore throat. Denies chest congestion, productive cough or wheezing. Denies chest pains, palpitations and leg swelling Denies abdominal pain, nausea, vomiting,diarrhea or constipation.   Denies dysuria, frequency, hesitancy or incontinence. Denies joint pain, swelling and limitation in mobility. Denies headaches, seizures, numbness, or tingling. Denies depression, anxiety or insomnia. Denies skin break down or rash.   PE  BP 114/72   Pulse 83   Ht 4\' 11"  (1.499 m)   Wt 143 lb 1.9 oz (64.9 kg)   SpO2 93%   BMI 28.91 kg/m   Patient alert and oriented and in no cardiopulmonary distress.  HEENT: No facial asymmetry, EOMI,     Neck supple .  Chest: Clear to auscultation bilaterally.  CVS: S1, S2 no murmurs, no S3.Regular rate.  ABD: Soft non tender.   Ext: No edema  MS: Adequate ROM spine, shoulders, hips and knees.  Skin: Intact, no ulcerations or rash noted.  Psych: Good eye contact, normal affect. Memory intact not anxious or depressed appearing.  CNS: CN 2-12 intact, power,  normal throughout.no focal deficits noted.   Assessment & Plan  Prediabetes Patient educated about the importance of limiting  Carbohydrate intake , the need to  commit to daily physical activity for a minimum of 30 minutes , and to commit weight loss. The fact that changes in all these areas will reduce or eliminate all together the development of diabetes is stressed.      Latest Ref Rng & Units 01/21/2023    9:21 AM 04/09/2022    9:06 AM 08/08/2021    9:19 AM 08/08/2021    9:14 AM 11/27/2020    8:36 AM  Diabetic Labs  HbA1c 4.8 - 5.6 % 6.0  6.4   5.9  6.2   Chol 100 - 199 mg/dL 782  956  213   086   HDL >39 mg/dL 85  70  88   66   Calc LDL 0 - 99 mg/dL 93  83  75   578   Triglycerides 0 - 149 mg/dL 76  91  61   469   Creatinine 0.57 - 1.00 mg/dL 6.29  5.28  4.13   2.44       10/03/2023    8:15 AM 06/25/2023    1:14 PM 05/26/2023   11:07 AM 05/02/2023    8:02 AM 01/28/2023    9:41 AM 01/28/2023    9:40 AM 01/28/2023    8:58 AM  BP/Weight  Systolic BP 114 120  127 144 144 131  Diastolic BP 72 70  76 92 90 85  Wt. (Lbs) 143.12 141.2 145 145.08   143.04  BMI 28.91 kg/m2 28.52 kg/m2 27.4 kg/m2 27.41 kg/m2   28.89 kg/m2  Latest Ref Rng & Units 04/08/2019    2:40 PM 09/01/2018   12:00 AM  Foot/eye exam completion dates  Eye Exam No Retinopathy  No Retinopathy      Foot Form Completion  Done      This result is from an external source.    Updated lab needed at/ before next visit.   Vitamin D deficiency Updated lab needed at/ before next visit.   Rheumatoid arthritis (HCC) Discontinued medications reported 09/2023  Overweight (BMI 25.0-29.9)  Patient re-educated about  the importance of commitment to a  minimum of 150 minutes of exercise per week as able.  The importance of healthy food choices with portion control discussed, as well as eating regularly and within a 12 hour window most days. The need to choose "clean , green" food 50 to 75% of the time is discussed, as well as to make water the primary drink and set a goal of 64 ounces water daily.       10/03/2023    8:15 AM 06/25/2023    1:14 PM 05/26/2023   11:07 AM  Weight /BMI   Weight 143 lb 1.9 oz 141 lb 3.2 oz 145 lb  Height 4\' 11"  (1.499 m) 4\' 11"  (1.499 m) 5\' 1"  (1.549 m)  BMI 28.91 kg/m2 28.52 kg/m2 27.4 kg/m2      Hypertension goal BP (blood pressure) < 130/80 Controlled, no change in medication DASH diet and commitment to daily physical activity for a minimum of 30 minutes discussed and encouraged, as a part of hypertension management. The importance of attaining a healthy weight is also discussed.     10/03/2023    8:15 AM 06/25/2023    1:14 PM 05/26/2023   11:07 AM 05/02/2023    8:02 AM 01/28/2023    9:41 AM 01/28/2023    9:40 AM 01/28/2023    8:58 AM  BP/Weight  Systolic BP 114 120  127 144 144 131  Diastolic BP 72 70  76 92 90 85  Wt. (Lbs) 143.12 141.2 145 145.08   143.04  BMI 28.91 kg/m2 28.52 kg/m2 27.4 kg/m2 27.41 kg/m2   28.89 kg/m2       Hyperlipidemia LDL goal <100 Hyperlipidemia:Low fat diet discussed and encouraged.   Lipid Panel  Lab Results  Component Value Date   CHOL 192 01/21/2023   HDL 85 01/21/2023   LDLCALC 93 01/21/2023   TRIG 76 01/21/2023   CHOLHDL 2.3 01/21/2023     Updated lab needed at/ before next visit.   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

## 2023-10-04 LAB — CBC
Hematocrit: 40.1 % (ref 34.0–46.6)
Hemoglobin: 13.5 g/dL (ref 11.1–15.9)
MCH: 29.1 pg (ref 26.6–33.0)
MCHC: 33.7 g/dL (ref 31.5–35.7)
MCV: 86 fL (ref 79–97)
Platelets: 229 10*3/uL (ref 150–450)
RBC: 4.64 x10E6/uL (ref 3.77–5.28)
RDW: 13.9 % (ref 11.7–15.4)
WBC: 4.7 10*3/uL (ref 3.4–10.8)

## 2023-10-04 LAB — LIPID PANEL W/O CHOL/HDL RATIO
Cholesterol, Total: 174 mg/dL (ref 100–199)
HDL: 90 mg/dL (ref >39)
LDL Chol Calc (NIH): 71 mg/dL (ref 0–99)
Triglycerides: 69 mg/dL (ref 0–149)
VLDL Cholesterol Cal: 13 mg/dL (ref 5–40)

## 2023-10-04 LAB — CMP14+EGFR
ALT: 21 IU/L (ref 0–32)
AST: 27 IU/L (ref 0–40)
Albumin: 4.1 g/dL (ref 3.9–4.9)
Alkaline Phosphatase: 90 IU/L (ref 44–121)
BUN/Creatinine Ratio: 9 — ABNORMAL LOW (ref 12–28)
BUN: 5 mg/dL — ABNORMAL LOW (ref 8–27)
Bilirubin Total: 0.3 mg/dL (ref 0.0–1.2)
CO2: 22 mmol/L (ref 20–29)
Calcium: 9.1 mg/dL (ref 8.7–10.3)
Chloride: 105 mmol/L (ref 96–106)
Creatinine, Ser: 0.57 mg/dL (ref 0.57–1.00)
Globulin, Total: 2.4 g/dL (ref 1.5–4.5)
Glucose: 91 mg/dL (ref 70–99)
Potassium: 4 mmol/L (ref 3.5–5.2)
Sodium: 142 mmol/L (ref 134–144)
Total Protein: 6.5 g/dL (ref 6.0–8.5)
eGFR: 101 mL/min/{1.73_m2} (ref >59)

## 2023-10-04 LAB — HEMOGLOBIN A1C
Est. average glucose Bld gHb Est-mCnc: 117 mg/dL
Hgb A1c MFr Bld: 5.7 % — ABNORMAL HIGH (ref 4.8–5.6)

## 2023-10-04 LAB — TSH: TSH: 2.51 u[IU]/mL (ref 0.450–4.500)

## 2023-10-04 LAB — VITAMIN D 25 HYDROXY (VIT D DEFICIENCY, FRACTURES): Vit D, 25-Hydroxy: 36.1 ng/mL (ref 30.0–100.0)

## 2023-10-05 ENCOUNTER — Encounter: Payer: Self-pay | Admitting: Family Medicine

## 2023-10-20 NOTE — Assessment & Plan Note (Signed)
 Pt has stopped disease modifying medication does not feel that she needs it, also states that she hasmissed sevral appts, not currently symptomatic and not interested in medication at this time

## 2023-11-03 ENCOUNTER — Other Ambulatory Visit: Payer: Self-pay | Admitting: Family Medicine

## 2023-11-10 ENCOUNTER — Other Ambulatory Visit (HOSPITAL_COMMUNITY)

## 2023-11-11 ENCOUNTER — Other Ambulatory Visit: Payer: Self-pay

## 2023-11-11 MED ORDER — CLONIDINE HCL 0.2 MG PO TABS: 0.2000 mg | ORAL_TABLET | Freq: Every day | ORAL | 1 refills | Status: DC

## 2023-11-24 ENCOUNTER — Ambulatory Visit (HOSPITAL_COMMUNITY)
Admission: RE | Admit: 2023-11-24 | Discharge: 2023-11-24 | Disposition: A | Discharge status: Home / Self Care | Source: Ambulatory Visit | Attending: Family Medicine | Admitting: Family Medicine

## 2023-11-24 DIAGNOSIS — Z780 Asymptomatic menopausal state: Secondary | ICD-10-CM | POA: Insufficient documentation

## 2023-11-24 DIAGNOSIS — M85851 Other specified disorders of bone density and structure, right thigh: Secondary | ICD-10-CM | POA: Diagnosis not present

## 2023-12-02 ENCOUNTER — Encounter: Payer: Self-pay | Admitting: Internal Medicine

## 2023-12-09 ENCOUNTER — Encounter: Payer: Self-pay | Admitting: Internal Medicine

## 2023-12-31 ENCOUNTER — Encounter: Payer: Self-pay | Admitting: Family Medicine

## 2023-12-31 ENCOUNTER — Ambulatory Visit (INDEPENDENT_AMBULATORY_CARE_PROVIDER_SITE_OTHER): Admitting: Family Medicine

## 2023-12-31 ENCOUNTER — Telehealth: Payer: Self-pay

## 2023-12-31 VITALS — BP 123/79 | HR 87 | Resp 16 | Ht 59.0 in | Wt 146.0 lb

## 2023-12-31 DIAGNOSIS — Z87891 Personal history of nicotine dependence: Secondary | ICD-10-CM | POA: Diagnosis not present

## 2023-12-31 DIAGNOSIS — M8580 Other specified disorders of bone density and structure, unspecified site: Secondary | ICD-10-CM | POA: Diagnosis not present

## 2023-12-31 DIAGNOSIS — Z122 Encounter for screening for malignant neoplasm of respiratory organs: Secondary | ICD-10-CM | POA: Insufficient documentation

## 2023-12-31 DIAGNOSIS — Z0000 Encounter for general adult medical examination without abnormal findings: Secondary | ICD-10-CM | POA: Insufficient documentation

## 2023-12-31 DIAGNOSIS — R7303 Prediabetes: Secondary | ICD-10-CM

## 2023-12-31 DIAGNOSIS — Z1231 Encounter for screening mammogram for malignant neoplasm of breast: Secondary | ICD-10-CM | POA: Insufficient documentation

## 2023-12-31 DIAGNOSIS — Z800 Family history of malignant neoplasm of digestive organs: Secondary | ICD-10-CM | POA: Insufficient documentation

## 2023-12-31 DIAGNOSIS — E785 Hyperlipidemia, unspecified: Secondary | ICD-10-CM | POA: Diagnosis not present

## 2023-12-31 MED ORDER — SERTRALINE HCL 25 MG PO TABS: 25.0000 mg | ORAL_TABLET | Freq: Every day | ORAL | 1 refills | Status: DC

## 2023-12-31 MED ORDER — CALCIUM CARBONATE ANTACID 500 MG PO CHEW: 1.0000 | CHEWABLE_TABLET | Freq: Two times a day (BID) | ORAL | 3 refills | Status: AC

## 2023-12-31 NOTE — Assessment & Plan Note (Signed)
 Reportts definitely smooked 1/2 PPD between 42 to 66 years old, quit in 2024, needs lung cvancer screening will refer

## 2023-12-31 NOTE — Telephone Encounter (Signed)
 LVM to call and schedule LDCT, SDMV completed 05/2023.

## 2023-12-31 NOTE — Assessment & Plan Note (Signed)
Needs to be scheduled. °

## 2023-12-31 NOTE — Assessment & Plan Note (Signed)
 Commit to daily calcium  , vit D and weight bearing exercise for 30 mins at least 5 days / week

## 2023-12-31 NOTE — Progress Notes (Signed)
 Subjective:    Jaime Murray is a 68 y.o. female who presents for a Welcome to Medicare exam.   Cardiac Risk Factors include: advanced age (>21men, >55 women);dyslipidemia;hypertension;smoking/ tobacco exposure      Objective:     Today's Vitals   12/31/23 0849  BP: 123/79  Pulse: 87  Resp: 16  SpO2: (!) 2%  Weight: 146 lb (66.2 kg)  Height: 4' 11 (1.499 m)  Body mass index is 29.49 kg/m.  Medications Outpatient Encounter Medications as of 12/31/2023  Medication Sig   acetaminophen  (TYLENOL ) 325 MG tablet Take 650 mg by mouth every 6 (six) hours as needed for moderate pain.   amLODipine  (NORVASC ) 5 MG tablet TAKE 1 TABLET DAILY   calcium  carbonate (TUMS) 500 MG chewable tablet Chew 1 tablet (200 mg of elemental calcium  total) by mouth 2 (two) times daily with a meal.   cetirizine  (ZYRTEC ) 10 MG tablet Take 1 tablet (10 mg total) by mouth daily.   cloNIDine  (CATAPRES ) 0.2 MG tablet Take 1 tablet (0.2 mg total) by mouth at bedtime.   clotrimazole -betamethasone  (LOTRISONE ) cream Apply 1 Application topically daily.   Cod Liver Oil w/Vit A & D CAPS Take 1 capsule by mouth daily.   fluticasone  (FLONASE ) 50 MCG/ACT nasal spray USE 2 SPRAYS IN EACH NOSTRIL DAILY   hydroxypropyl methylcellulose / hypromellose (ISOPTO TEARS / GONIOVISC) 2.5 % ophthalmic solution Place 1 drop into both eyes 3 (three) times daily as needed for dry eyes.   lovastatin  (MEVACOR ) 40 MG tablet TAKE 1 TABLET AT BEDTIME   Omega-3 Fatty Acids (FISH OIL) 1000 MG CPDR Take by mouth daily at 6 (six) AM.   pantoprazole  (PROTONIX ) 40 MG tablet TAKE 1 TABLET TWICE A DAY BEFORE MEALS   potassium chloride  (KLOR-CON  10) 10 MEQ tablet Take 1 tablet (10 mEq total) by mouth 2 (two) times daily.   Probiotic Product (PROBIOTIC DAILY) CAPS Take 1 capsule by mouth daily.   sertraline  (ZOLOFT ) 25 MG tablet Take 1 tablet (25 mg total) by mouth daily.   VITAMIN D  PO Take 1 capsule by mouth daily.   [DISCONTINUED]  sertraline  (ZOLOFT ) 100 MG tablet TAKE ONE-HALF (1/2) TABLET DAILY   [DISCONTINUED] terbinafine  (LAMISIL ) 250 MG tablet Take 1 tablet (250 mg total) by mouth daily.   No facility-administered encounter medications on file as of 12/31/2023.     History: Past Medical History:  Diagnosis Date   Anxiety    Arthritis    Constipation    Depression    Fatigue    GERD (gastroesophageal reflux disease)    HTN (hypertension)    Hypercholesteremia    IDA (iron deficiency anemia)    Past Surgical History:  Procedure Laterality Date   BIOPSY  11/12/2021   Procedure: BIOPSY;  Surgeon: Suzette Espy, MD;  [facility];  Service: Endoscopy;;   COLONOSCOPY  01/01/2010   Dr. Deborrah Fam and sigmoid polyps, diminutive rectosigmoid polyp, tubular adenomas   COLONOSCOPY N/A 12/06/2015   normal   COLONOSCOPY WITH PROPOFOL  N/A 11/12/2021   noncompliant and redundant colon, small sigmoid polyp s/p removal. Benign. 5 year surveillance due to family history.   ESOPHAGOGASTRODUODENOSCOPY  12/14/2009   Dr. Rourk:tight schatzki's ring s/p dilation/moderate size hiatal hernia   ESOPHAGOGASTRODUODENOSCOPY N/A 03/20/2020   Procedure: ESOPHAGOGASTRODUODENOSCOPY (EGD);  Surgeon: Aldean Hummingbird, MD;  [facility];  Service: General;  Laterality: N/A;   GANGLION CYST EXCISION  05/22/2012   Procedure: REMOVAL GANGLION OF WRIST;  Surgeon: Darrin Emerald,  MD;  Location: [facility];  Service: Orthopedics;  Laterality: Left;   POLYPECTOMY  11/12/2021   Procedure: POLYPECTOMY;  Surgeon: Suzette Espy, MD;  [facility];  Service: Endoscopy;;   XI ROBOTIC ASSISTED PARAESOPHAGEAL HERNIA REPAIR N/A 03/20/2020   Procedure: ROBOTIC REPAIR OF LARGE PARAESOPHAGEAL HIATAL HERNIA WITH MESH AND GASTROPEXY AND PARTIAL FUNDOPLICATION;  Surgeon: Aldean Hummingbird, MD;  [facility];  Service: General;  Laterality: N/A;    Family History  Problem Relation Age of Onset   Hypertension Mother    Colon  cancer Mother        unknown age of disease, still living, cancer-free    Hypertension Father    Stroke Father    Heart disease Other    Arthritis Other    Social History   Occupational History    Employer: [employer]  Tobacco Use   Smoking status: Former    Current packs/day: 0.25    Average packs/day: 0.3 packs/day for 30.0 years (7.5 ttl pk-yrs)    Types: Cigarettes   Smokeless tobacco: Never  Vaping Use   Vaping status: Never Used  Substance and Sexual Activity   Alcohol  use: Yes    Comment: 3-4 beers on Friday and Saturday, no longer during week    Drug use: No   Sexual activity: Yes    Birth control/protection: Post-menopausal    Tobacco Counseling Counseling given: Not Answered   Immunizations and Health Maintenance Immunization History  Administered Date(s) Administered   Fluad Quad(high Dose 65+) 04/12/2023   H1N1 05/12/2008   Influenza Split 04/09/2012   Influenza Whole 07/20/2007, 04/21/2008, 04/26/2009, 05/15/2010, 04/03/2011   Influenza,inj,Quad PF,6+ Mos 04/09/2013, 03/31/2014, 04/19/2015, 06/26/2016, 04/18/2017, 05/15/2018, 04/08/2019, 05/02/2020, 05/22/2021, 04/09/2022   Moderna SARS-COV2 Booster Vaccination 05/29/2021   Moderna Sars-Covid-2 Vaccination 09/02/2019, 10/01/2019, 06/08/2020, 06/07/2022   PNEUMOCOCCAL CONJUGATE-20 05/02/2023   Pneumococcal Conjugate-13 11/23/2014   Pneumococcal Polysaccharide-23 04/06/2009, 09/12/2016   RSV,unspecified 04/28/2023   Td 04/06/2009   Tdap 01/25/2020   Unspecified SARS-COV-2 Vaccination 04/28/2023   Zoster Recombinant(Shingrix ) 06/23/2018, 08/08/2021   Health Maintenance Due  Topic Date Due   COVID-19 Vaccine (7 - 2024-25 season) 10/27/2023    Activities of Daily Living    12/31/2023    8:54 AM  In your present state of health, do you have any difficulty performing the following activities:  Hearing? 1  Vision? 0  Difficulty concentrating or making decisions? 0  Walking or climbing  stairs? 0  Dressing or bathing? 0  Doing errands, shopping? 0  Preparing Food and eating ? N  Using the Toilet? N  In the past six months, have you accidently leaked urine? Y  Do you have problems with loss of bowel control? Y  Managing your Medications? N  Managing your Finances? N  Housekeeping or managing your Housekeeping? N    Physical Exam   Physical Exam (optional), or other factors deemed appropriate based on the beneficiary's medical and social history and current clinical standards.   Advanced Directives:   Discussed and written information provided : EKG: Not done      Assessment:     This is a routine wellness examination for this patient . Tressa Fruits  Vision/Hearing screen No results found.   Goals      DIET - EAT MORE FRUITS AND VEGETABLES     STOP SMOKING        Depression Screen    12/31/2023    8:52 AM 05/02/2023    8:12 AM  01/28/2023    9:02 AM 11/19/2022    2:53 PM  PHQ 2/9 Scores  PHQ - 2 Score 0 0 0 0  PHQ- 9 Score  0 2      Fall Risk    12/31/2023    8:54 AM  Fall Risk   Falls in the past year? 1  Number falls in past yr: 1  Injury with Fall? 0  Follow up Falls evaluation completed    Cognitive Function:        12/31/2023    8:55 AM  6CIT Screen  What Year? 0 points  What month? 0 points  What time? 0 points  Count back from 20 0 points  Months in reverse 0 points  Repeat phrase 0 points  Total Score 0 points    Patient Care Team: Towanda Fret, MD as PCP - General Mallipeddi, Kennyth Pean, MD as PCP - Cardiology (Cardiology)     Plan:     I have personally reviewed and noted the following in the patient's chart:   Medical and social history Use of alcohol , tobacco or illicit drugs  Current medications and supplements including opioid prescriptions.  Functional ability and status NOt currently taking opioids Nutritional status Physical activity Advanced directives List of other  physicians Hospitalizations, surgeries, and ER visits in previous 12 months Vitals Screenings to include cognitive, depression, and falls Referrals and appointments  In addition, I have reviewed and discussed with patient certain preventive protocols, quality metrics, and best practice recommendations. A written personalized care plan for preventive services as well as general preventive health recommendations were provided to patient.     Alberteen Huge, MD 12/31/2023

## 2023-12-31 NOTE — Patient Instructions (Addendum)
 Annual exam as before , call if you need me sooner  You are referred for lung cancer screening, GREAT that you have quit  Please schedule mammogram at checkout  New for bones is twice daily calcium  and commitment to daily walking!   Reduce sertraline  to 25 mg daily, new dose is prescribed  Fasting lpid, cmnp and EGFr and HBA1C 5 to 7 days before October appt  Keep up GREAT health habits!  Review information on living will and work on having one done   Thanks for choosing [facility], we consider it a privelige to serve you.

## 2024-01-06 ENCOUNTER — Telehealth: Payer: Self-pay | Admitting: Family Medicine

## 2024-01-06 MED ORDER — HYDROCHLOROTHIAZIDE 25 MG PO TABS: 25.0000 mg | ORAL_TABLET | Freq: Every day | ORAL | 3 refills | Status: AC

## 2024-01-06 MED ORDER — POTASSIUM CHLORIDE ER 10 MEQ PO TBCR: 10.0000 meq | EXTENDED_RELEASE_TABLET | Freq: Two times a day (BID) | ORAL | 3 refills | Status: AC

## 2024-01-06 NOTE — Telephone Encounter (Signed)
 I spoke with pt, she has been taking hydrochlorothiazide  25 mg daily, had a lot at home, refill sent for 1 year for hydrochlorothiazide  and potassium

## 2024-01-06 NOTE — Addendum Note (Signed)
 Addended by: Towanda Fret on: 01/06/2024 01:28 PM   Modules accepted: Orders

## 2024-01-06 NOTE — Telephone Encounter (Signed)
 Copied from CRM [id number]. Topic: Clinical - Medication Refill >> Jan 06, 2024 11:06 AM Alysia Jumbo S wrote: Medication: hydrochlorothiazide  (HYDRODIURIL ) 25 MG  Has the patient contacted their pharmacy? Yes, contact PCP (Agent: If no, request that the patient contact the pharmacy for the refill. If patient does not wish to contact the pharmacy document the reason why and proceed with request.) (Agent: If yes, when and what did the pharmacy advise?)  This is the patient's preferred pharmacy:  [facility] - [place], [place] - [address] [place] [address] Phone: [phone] Fax: [phone]    Is this the correct pharmacy for this prescription? Yes If no, delete pharmacy and type the correct one.   Has the prescription been filled recently? No  Is the patient out of the medication? Yes  Has the patient been seen for an appointment in the last year OR does the patient have an upcoming appointment? Yes  Can we respond through MyChart? Yes  Agent: Please be advised that Rx refills may take up to 3 business days. We ask that you follow-up with your pharmacy.

## 2024-01-29 ENCOUNTER — Encounter: Payer: Self-pay | Admitting: Gastroenterology

## 2024-01-29 ENCOUNTER — Ambulatory Visit (INDEPENDENT_AMBULATORY_CARE_PROVIDER_SITE_OTHER): Admitting: Gastroenterology

## 2024-01-29 DIAGNOSIS — K219 Gastro-esophageal reflux disease without esophagitis: Secondary | ICD-10-CM

## 2024-01-29 DIAGNOSIS — Z800 Family history of malignant neoplasm of digestive organs: Secondary | ICD-10-CM | POA: Diagnosis not present

## 2024-01-29 DIAGNOSIS — Z860101 Personal history of adenomatous and serrated colon polyps: Secondary | ICD-10-CM | POA: Diagnosis not present

## 2024-01-29 MED ORDER — PANTOPRAZOLE SODIUM 40 MG PO TBEC: 40.0000 mg | DELAYED_RELEASE_TABLET | Freq: Every day | ORAL | 3 refills | Status: AC

## 2024-01-29 NOTE — Patient Instructions (Signed)
 I have refilled pantoprazole  for you to take daily, 30 minutes before breakfast.  Your next colonoscopy will be due in 2028.  Please call us  with any concerns in the meantime.  We will see you in 1 year.   Have a wonderful summer and birthday!  I enjoyed seeing you again today! I value our relationship and want to provide genuine, compassionate, and quality care. You may receive a survey regarding your visit with me, and I welcome your feedback! Thanks so much for taking the time to complete this. I look forward to seeing you again.      Therisa MICAEL Stager, PhD, ANP-BC [facility]

## 2024-01-29 NOTE — Progress Notes (Signed)
 Gastroenterology Office Note     Primary Care Physician:  Antonetta Rollene BRAVO, MD  Primary Gastroenterologist: Dr. Shaaron    Chief Complaint   Chief Complaint  Patient presents with   Follow-up    Pt arrives for follow up. Pt doing well. No concerns/questions at this time. Pt needing refills on Protonix       History of Present Illness   Jaime Murray is a 68 y.o. 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 Tanda, last seen virtually in 2023, here for follow-up GERD and refills.   No abdominal pain, N/V, changes in bowel habits, constipation, diarrhea, overt GI bleeding, uncontrolled GERD, dysphagia, unexplained weight loss, lack of appetite, unexplained weight gain.   Continue on pantoprazole  40 mg daily. She feels like this controls GERD symptoms well.    Colonoscopy 2023: non-compliant redundant colon, small sigmoid polyp. Path: benign. Surveillance in 2028.   Dr. Rourk:tight schatzki's ring s/p dilation/moderate size hiatal hernia    Personal hx of adenomas in past and FH of colon cancer.    Past Medical History:  Diagnosis Date   Anxiety    Arthritis    Constipation    Depression    Fatigue    GERD (gastroesophageal reflux disease)    HTN (hypertension)    Hypercholesteremia    IDA (iron deficiency anemia)     Past Surgical History:  Procedure Laterality Date   BIOPSY  11/12/2021   Procedure: BIOPSY;  Surgeon: Shaaron Lamar HERO, MD;  [facility];  Service: Endoscopy;;   COLONOSCOPY  01/01/2010   Dr. Genia and sigmoid polyps, diminutive rectosigmoid polyp, tubular adenomas   COLONOSCOPY N/A 12/06/2015   normal   COLONOSCOPY WITH PROPOFOL  N/A 11/12/2021   noncompliant and redundant colon, small sigmoid polyp s/p removal. Benign. 5 year surveillance due to family history.    ESOPHAGOGASTRODUODENOSCOPY  12/14/2009   Dr. Rourk:tight schatzki's ring s/p dilation/moderate size hiatal hernia   ESOPHAGOGASTRODUODENOSCOPY N/A 03/20/2020   Procedure: ESOPHAGOGASTRODUODENOSCOPY (EGD);  Surgeon: Tanda Locus, MD;  Location: [facility];  Service: General;  Laterality: N/A;   GANGLION CYST EXCISION  05/22/2012   Procedure: REMOVAL GANGLION OF WRIST;  Surgeon: Taft BRAVO Minerva, MD;  [facility]: [facility];  Service: Orthopedics;  Laterality: Left;   POLYPECTOMY  11/12/2021   Procedure: POLYPECTOMY;  Surgeon: Shaaron Lamar HERO, MD;  [facility];  Service: Endoscopy;;   XI ROBOTIC ASSISTED PARAESOPHAGEAL HERNIA REPAIR N/A 03/20/2020   Procedure: ROBOTIC REPAIR OF LARGE PARAESOPHAGEAL HIATAL HERNIA WITH MESH AND GASTROPEXY AND PARTIAL FUNDOPLICATION;  Surgeon: Tanda Locus, MD;  [facility];  Service: General;  Laterality: N/A;    Current Outpatient Medications  Medication Sig Dispense Refill   acetaminophen  (TYLENOL ) 325 MG tablet Take 650 mg by mouth every 6 (six) hours as needed for moderate pain. (Patient taking differently: Take 650 mg by mouth every 6 (six) hours as needed for moderate pain (pain score 4-6).)     amLODipine  (NORVASC ) 5 MG tablet TAKE 1 TABLET DAILY 90 tablet 3   calcium  carbonate (TUMS) 500 MG chewable tablet Chew 1 tablet (200 mg of elemental calcium  total) by mouth 2 (two) times daily with a meal. 180 tablet 3   cetirizine  (ZYRTEC ) 10 MG tablet Take 1 tablet (10 mg total) by mouth daily. 90 tablet 1   cloNIDine  (CATAPRES ) 0.2  MG tablet Take 1 tablet (0.2 mg total) by mouth at bedtime. 90 tablet 1   clotrimazole -betamethasone  (LOTRISONE ) cream Apply 1 Application topically daily. 30 g 0   fluticasone  (FLONASE ) 50 MCG/ACT nasal spray USE 2 SPRAYS IN EACH NOSTRIL DAILY 48 g 3   hydrochlorothiazide  (HYDRODIURIL ) 25 MG tablet Take 1 tablet (25 mg total) by mouth daily. 90 tablet 3   hydroxypropyl methylcellulose / hypromellose (ISOPTO TEARS / GONIOVISC)  2.5 % ophthalmic solution Place 1 drop into both eyes 3 (three) times daily as needed for dry eyes.     lovastatin  (MEVACOR ) 40 MG tablet TAKE 1 TABLET AT BEDTIME 90 tablet 3   potassium chloride  (KLOR-CON  10) 10 MEQ tablet Take 1 tablet (10 mEq total) by mouth 2 (two) times daily. 180 tablet 3   Probiotic Product (PROBIOTIC DAILY) CAPS Take 1 capsule by mouth daily.     sertraline  (ZOLOFT ) 25 MG tablet Take 1 tablet (25 mg total) by mouth daily. 90 tablet 1   VITAMIN D  PO Take 1 capsule by mouth daily.     Cod Liver Oil w/Vit A & D CAPS Take 1 capsule by mouth daily. (Patient not taking: Reported on 01/29/2024)     Omega-3 Fatty Acids (FISH OIL) 1000 MG CPDR Take by mouth daily at 6 (six) AM. (Patient not taking: Reported on 01/29/2024)     pantoprazole  (PROTONIX ) 40 MG tablet Take 1 tablet (40 mg total) by mouth daily. 30 minutes before breakfast 90 tablet 3   No current facility-administered medications for this visit.    Allergies as of 01/29/2024 - Review Complete 01/29/2024  Allergen Reaction Noted   Gabapentin  Palpitations 06/23/2017    Family History  Problem Relation Age of Onset   Hypertension Mother    Colon cancer Mother        unknown age of disease, still living, cancer-free    Hypertension Father    Stroke Father    Heart disease Other    Arthritis Other     Social History   Socioeconomic History   Marital status: Married    Spouse name: Not on file   Number of children: Not on file   Years of education: 12   Highest education level: Not on file  Occupational History    Employer: [employer]  Tobacco Use   Smoking status: Former    Current packs/day: 0.25    Average packs/day: 0.3 packs/day for 30.0 years (7.5 ttl pk-yrs)    Types: Cigarettes   Smokeless tobacco: Never  Vaping Use   Vaping status: Never Used  Substance and Sexual Activity   Alcohol  use: Yes    Comment: holidays   Drug use: No   Sexual activity: Yes    Birth  control/protection: Post-menopausal  Other Topics Concern   Not on file  Social History Narrative   Not on file   Social Drivers of Health   Financial Resource Strain: Low Risk  (12/31/2023)   Overall Financial Resource Strain (CARDIA)    Difficulty of Paying Living Expenses: Not very hard  Food Insecurity: No Food Insecurity (12/31/2023)   Hunger Vital Sign    Worried About Running Out of Food in the Last Year: Never true    Ran Out of Food in the Last Year: Never true  Transportation Needs: No Transportation Needs (12/31/2023)   PRAPARE - Administrator, Civil Service (Medical): No    Lack of Transportation (Non-Medical): No  Physical Activity: Insufficiently Active (12/31/2023)  Exercise Vital Sign    Days of Exercise per Week: 4 days    Minutes of Exercise per Session: 30 min  Stress: No Stress Concern Present (12/31/2023)   [employer] of Occupational Health - Occupational Stress Questionnaire    Feeling of Stress : Not at all  Social Connections: Moderately Integrated (12/31/2023)   Social Connection and Isolation Panel    Frequency of Communication with Friends and Family: More than three times a week    Frequency of Social Gatherings with Friends and Family: More than three times a week    Attends Religious Services: More than 4 times per year    Active Member of [employer] or Organizations: No    Attends Banker Meetings: Never    Marital Status: Married  Catering manager Violence: Not At Risk (12/31/2023)   Humiliation, Afraid, Rape, and Kick questionnaire    Fear of Current or Ex-Partner: No    Emotionally Abused: No    Physically Abused: No    Sexually Abused: No     Review of Systems   Gen: Denies any fever, chills, fatigue, weight loss, lack of appetite.  CV: Denies chest pain, heart palpitations, peripheral edema, syncope.  Resp: Denies shortness of breath at rest or with exertion. Denies wheezing or cough.  GI: Denies dysphagia or  odynophagia. Denies jaundice, hematemesis, fecal incontinence. GU : Denies urinary burning, urinary frequency, urinary hesitancy MS: Denies joint pain, muscle weakness, cramps, or limitation of movement.  Derm: Denies rash, itching, dry skin Psych: Denies depression, anxiety, memory loss, and confusion Heme: Denies bruising, bleeding, and enlarged lymph nodes.   Physical Exam   BP 109/68   Pulse 80   Temp 97.7 F (36.5 C)   Ht 4' 11 (1.499 m)   Wt 146 lb 11.2 oz (66.5 kg)   BMI 29.63 kg/m  General:   Alert and oriented. Pleasant and cooperative. Well-nourished and well-developed.  Head:  Normocephalic and atraumatic. Eyes:  Without icterus Abdomen:  +BS, soft, non-tender and non-distended. No HSM noted. No guarding or rebound. No masses appreciated.  Rectal:  Deferred  Msk:  Symmetrical without gross deformities. Normal posture. Extremities:  Without edema. Neurologic:  Alert and  oriented x4;  grossly normal neurologically. Skin:  Intact without significant lesions or rashes. Psych:  Alert and cooperative. Normal mood and affect.  Lab Results  Component Value Date   ALT 21 10/03/2023   AST 27 10/03/2023   ALKPHOS 90 10/03/2023   BILITOT 0.3 10/03/2023   Lab Results  Component Value Date   NA 142 10/03/2023   CL 105 10/03/2023   K 4.0 10/03/2023   CO2 22 10/03/2023   BUN 5 (L) 10/03/2023   CREATININE 0.57 10/03/2023   EGFR 101 10/03/2023   CALCIUM  9.1 10/03/2023   ALBUMIN 4.1 10/03/2023   GLUCOSE 91 10/03/2023   Lab Results  Component Value Date   WBC 4.7 10/03/2023   HGB 13.5 10/03/2023   HCT 40.1 10/03/2023   MCV 86 10/03/2023   PLT 229 10/03/2023      Assessment   Jaime Murray is a 68 y.o. 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 Tanda, last seen virtually in 2023, here for follow-up GERD and refills.    GERD well-controlled with pantoprazole  daily and no alarm signs/symptoms. Will refill this today.   No concerning lower GI sign/symptoms.  Will need high risk surveillance in 2028.      PLAN    Pantoprazole  refilled Colonoscopy in 2028 Return in 1 year Call if concerns in interim   Therisa MICAEL Stager, PhD, [facility] Gastroenterology

## 2024-02-01 ENCOUNTER — Encounter: Payer: Self-pay | Admitting: Gastroenterology

## 2024-02-23 ENCOUNTER — Other Ambulatory Visit: Payer: Self-pay | Admitting: Family Medicine

## 2024-03-01 ENCOUNTER — Other Ambulatory Visit: Payer: Self-pay | Admitting: Family Medicine

## 2024-04-30 ENCOUNTER — Ambulatory Visit (HOSPITAL_COMMUNITY)
Admission: RE | Admit: 2024-04-30 | Discharge: 2024-04-30 | Disposition: A | Discharge status: Home / Self Care | Source: Ambulatory Visit | Attending: Family Medicine | Admitting: Family Medicine

## 2024-04-30 ENCOUNTER — Encounter: Payer: Self-pay | Admitting: Family Medicine

## 2024-04-30 DIAGNOSIS — Z1231 Encounter for screening mammogram for malignant neoplasm of breast: Secondary | ICD-10-CM | POA: Insufficient documentation

## 2024-05-07 ENCOUNTER — Ambulatory Visit: Payer: Self-pay

## 2024-05-10 ENCOUNTER — Other Ambulatory Visit: Payer: Self-pay | Admitting: Family Medicine

## 2024-05-20 LAB — CMP14+EGFR
ALT: 24 IU/L (ref 0–32)
AST: 26 IU/L (ref 0–40)
Albumin: 4.1 g/dL (ref 3.9–4.9)
Alkaline Phosphatase: 83 IU/L (ref 49–135)
BUN/Creatinine Ratio: 9 — ABNORMAL LOW (ref 12–28)
BUN: 5 mg/dL — ABNORMAL LOW (ref 8–27)
Bilirubin Total: 0.4 mg/dL (ref 0.0–1.2)
CO2: 25 mmol/L (ref 20–29)
Calcium: 9.5 mg/dL (ref 8.7–10.3)
Chloride: 99 mmol/L (ref 96–106)
Creatinine, Ser: 0.56 mg/dL — ABNORMAL LOW (ref 0.57–1.00)
Globulin, Total: 2.7 g/dL (ref 1.5–4.5)
Glucose: 108 mg/dL — ABNORMAL HIGH (ref 70–99)
Potassium: 3.6 mmol/L (ref 3.5–5.2)
Sodium: 140 mmol/L (ref 134–144)
Total Protein: 6.8 g/dL (ref 6.0–8.5)
eGFR: 101 mL/min/1.73 (ref >59)

## 2024-05-20 LAB — LIPID PANEL W/O CHOL/HDL RATIO
Cholesterol, Total: 190 mg/dL (ref 100–199)
HDL: 89 mg/dL (ref >39)
LDL Chol Calc (NIH): 88 mg/dL (ref 0–99)
Triglycerides: 73 mg/dL (ref 0–149)
VLDL Cholesterol Cal: 13 mg/dL (ref 5–40)

## 2024-05-20 LAB — HEMOGLOBIN A1C
Est. average glucose Bld gHb Est-mCnc: 123 mg/dL
Hgb A1c MFr Bld: 5.9 % — ABNORMAL HIGH (ref 4.8–5.6)

## 2024-05-21 ENCOUNTER — Ambulatory Visit: Payer: Self-pay | Admitting: Family Medicine

## 2024-05-26 ENCOUNTER — Encounter: Payer: Self-pay | Admitting: Family Medicine

## 2024-05-26 ENCOUNTER — Ambulatory Visit: Admitting: Family Medicine

## 2024-05-26 VITALS — BP 128/72 | HR 82 | Resp 16 | Ht 59.0 in | Wt 150.0 lb

## 2024-05-26 DIAGNOSIS — Z0000 Encounter for general adult medical examination without abnormal findings: Secondary | ICD-10-CM | POA: Insufficient documentation

## 2024-05-26 DIAGNOSIS — D509 Iron deficiency anemia, unspecified: Secondary | ICD-10-CM

## 2024-05-26 DIAGNOSIS — E785 Hyperlipidemia, unspecified: Secondary | ICD-10-CM | POA: Diagnosis not present

## 2024-05-26 DIAGNOSIS — R7303 Prediabetes: Secondary | ICD-10-CM | POA: Diagnosis not present

## 2024-05-26 DIAGNOSIS — E559 Vitamin D deficiency, unspecified: Secondary | ICD-10-CM

## 2024-05-26 DIAGNOSIS — I10 Essential (primary) hypertension: Secondary | ICD-10-CM | POA: Diagnosis not present

## 2024-05-26 NOTE — Patient Instructions (Addendum)
 F/U in 6 months  Fasting CBC, lipid, cmp and eGFr, hBA1C, tSH and vit D 1 week before next visit  Please resume once daily oTC vit D3, 1000 units   Check at [facility] , your insurance likely has you in the [employer] Program which enables you to use the  faciluity and programs at a minimal cost  It is important that you exercise regularly at least 30 minutes 5 times a week. If you develop chest pain, have severe difficulty breathing, or feel very tired, stop exercising immediately and seek medical attention   Thanks for choosing [facility], we consider it a privelige to serve you.  Keep up the great work, reduce fried and fatty foods please

## 2024-05-26 NOTE — Progress Notes (Signed)
    Jaime Murray     MRN: [id number]      DOB: Mar 10, 1956  Chief Complaint  Patient presents with   Annual Exam    Cpe     HPI: Patient is in for annual physical exam. No other health concerns are expressed or addressed at the visit. Recent labs,  are reviewed. Immunization is reviewed , and  updated if needed.   PE: BP 128/72   Pulse 82   Resp 16   Ht 4' 11 (1.499 m)   Wt 150 lb 0.6 oz (68.1 kg)   SpO2 98%   BMI 30.30 kg/m   Pleasant  female, alert and oriented x 3, in no cardio-pulmonary distress. Afebrile. HEENT No facial trauma or asymetry. Sinuses non tender.  Extra occullar muscles intact.. External ears normal, . Neck: supple, no adenopathy,JVD or thyromegaly.No bruits.  Chest: Clear to ascultation bilaterally.No crackles or wheezes. Non tender to palpation   Cardiovascular system; Heart sounds normal,  S1 and  S2 ,no S3.  No murmur, or thrill. Apical beat not displaced Peripheral pulses normal.  Abdomen: Soft, non tender, no organomegaly or masses. No bruits. Bowel sounds normal. No guarding, tenderness or rebound.    Musculoskeletal exam: Full ROM of spine, hips , shoulders and knees. No deformity ,swelling or crepitus noted. No muscle wasting or atrophy.   Neurologic: Cranial nerves 2 to 12 intact. Power, tone ,sensation and reflexes normal throughout. No disturbance in gait. No tremor.  Skin: Intact, no ulceration, erythema , scaling or rash noted. Pigmentation normal throughout  Psych; Normal mood and affect. Judgement and concentration normal   Assessment & Plan:  Encounter for annual physical exam Annual exam as documented. Counseling done  re healthy lifestyle involving commitment to 150 minutes exercise per week, heart healthy diet, and attaining healthy weight.The importance of adequate sleep also discussed. Regular seat belt use and home safety, is also discussed. Changes in health habits are decided on by the patient  with goals and time frames  set for achieving them. Immunization and cancer screening needs are specifically addressed at this visit.

## 2024-05-30 NOTE — Assessment & Plan Note (Signed)

## 2024-06-28 ENCOUNTER — Other Ambulatory Visit: Payer: Self-pay | Admitting: Family Medicine

## 2024-11-23 ENCOUNTER — Ambulatory Visit: Admitting: Family Medicine
